# Patient Record
Sex: Male | Born: 1965
Health system: Southern US, Community
[De-identification: ages and names within clinical notes are randomized; demographics above are authoritative.]

## PROBLEM LIST (undated history)

## (undated) DIAGNOSIS — E7521 Fabry (-Anderson) disease: Secondary | ICD-10-CM

## (undated) DIAGNOSIS — I517 Cardiomegaly: Secondary | ICD-10-CM

## (undated) DIAGNOSIS — N182 Chronic kidney disease, stage 2 (mild): Secondary | ICD-10-CM

## (undated) DIAGNOSIS — E559 Vitamin D deficiency, unspecified: Secondary | ICD-10-CM

## (undated) DIAGNOSIS — E782 Mixed hyperlipidemia: Secondary | ICD-10-CM

## (undated) DIAGNOSIS — I10 Essential (primary) hypertension: Secondary | ICD-10-CM

## (undated) HISTORY — DX: Essential (primary) hypertension: I10

## (undated) HISTORY — DX: Fabry (-anderson) disease: E75.21

## (undated) HISTORY — PX: KNEE SURGERY: SHX244

## (undated) HISTORY — PX: NO PAST SURGERIES: SHX2092

---

## 2013-06-30 ENCOUNTER — Ambulatory Visit: Payer: Self-pay | Admitting: Oncology

## 2013-07-31 ENCOUNTER — Ambulatory Visit: Payer: Self-pay | Admitting: Oncology

## 2013-11-15 ENCOUNTER — Emergency Department: Payer: Self-pay | Admitting: Emergency Medicine

## 2013-12-31 ENCOUNTER — Ambulatory Visit: Payer: Self-pay | Admitting: Oncology

## 2014-01-30 ENCOUNTER — Ambulatory Visit: Payer: Self-pay | Admitting: Oncology

## 2014-03-02 ENCOUNTER — Ambulatory Visit: Payer: Self-pay | Admitting: Oncology

## 2014-04-02 ENCOUNTER — Ambulatory Visit: Payer: Self-pay | Admitting: Oncology

## 2014-05-01 ENCOUNTER — Ambulatory Visit
Admit: 2014-05-01 | Disposition: A | Discharge status: Home / Self Care | Payer: Self-pay | Attending: Oncology | Admitting: Oncology

## 2014-06-01 ENCOUNTER — Ambulatory Visit
Admit: 2014-06-01 | Disposition: A | Discharge status: Home / Self Care | Payer: Self-pay | Attending: Oncology | Admitting: Oncology

## 2014-06-03 DIAGNOSIS — E7521 Fabry (-Anderson) disease: Secondary | ICD-10-CM | POA: Insufficient documentation

## 2014-06-16 ENCOUNTER — Other Ambulatory Visit: Payer: Self-pay | Admitting: Oncology

## 2014-06-23 NOTE — Consult Note (Signed)
Note Type Consult   HPI: Referred by Jimmy Footman at the [place] at [facility].   This 49 year old Male patient presents to the clinic for follow up  Fabry disease requiring Fabrazyme infusion.  Subjective: Chief Complaint/Diagnosis:   Fabry disease requiring Fabrazyme infusion. HPI:   Patient returns to clinic today for further evaluation and continuation of Fabrazyme infusion every 2 weeks.  He currently feels well and is asymptomatic.  He has no neurologic complaints.  He denies any recent fevers or illnesses.  He has a good appetite and denies weight loss.  He denies any pain.  He has no chest pain or shortness of breath.  He denies any nausea, vomiting, constipation, or diarrhea.  He has no urinary complaints.  Patient feels at his baseline and offers no specific complaints today.   Review of Systems:  Performance Status (ECOG): 0  Review of Systems:   As per HPI. Otherwise, 10 point system review was negative.   Allergies:  Penicillin: Unknown  Smoking History: Smoking History Never Smoked.(1)  PFSH: Additional Past Medical and Surgical History: Fabry disease.    Family history: Negative and noncontributory.    Social history: Patient denies tobacco or alcohol.   Home Medications: Medication Instructions Last Modified Date/Time  benzonatate 200 mg oral capsule 1 cap(s) orally 3 times a day 16-Sep-15 10:18  fexofenadine-pseudoephedrine 60 mg-120 mg oral tablet, extended release 1 tab(s) orally every 12 hours 16-Sep-15 10:18  Biaxin 500 mg oral tablet 1 tab(s) orally every 12 hours 16-Sep-15 10:18  Lasix 40 mg oral tablet 1 tab(s) orally once a day 16-Sep-15 09:46  Diovan 160 mg oral tablet 1 tab(s) orally once a day 16-Sep-15 09:46  potassium chloride 20 mEq oral tablet, extended release 1 tab(s) orally 2 times a day 16-Sep-15 09:46  Aspir 81 81 mg oral tablet 1 tab(s) orally once a day 16-Sep-15 09:46  multivitamin 1   once a day 16-Sep-15 09:46   Vitamin B-12 1000 mcg oral tablet 1 tab(s) orally once a day 16-Sep-15 09:46   Vital Signs:  :: Ht(CM): 181 Wt(KG): 86.1 BSA: 2 Temp: 97.5 Pulse: 91 RR: 20 O2 Sat: 99  BP: 126/56   Physical Exam:  General: well developed, well nourished, and in no acute distress  Mental Status: normal affect  Eyes: anicteric sclera  Musculoskeletal: No edema  Skin: No rash or petechiae noted  Neurological: alert, answering all questions appropriately.  Cranial nerves grossly intact   Assessment and Plan: Impression:   Fabry disease requiring Fabrazyme infusion. Plan:   1. Fabry disease requiring Fabrazyme infusion: Patient will require 1 mg/kg of Fabrazyme every 2 weeks indefinitely.  He will receive 1000 mg Tylenol and 25 mg Benadryl approximately 30 minutes prior to each infusion.  Patient has now relocated to [facility].  He expressed understanding that we can only provide his infusion.  Any laboratory work or questions regarding his disease or infusions must be directed towards to his primary geneticist.  Return to clinic every 2 weeks for his infusion and then in 6 months for routine follow-up.  He expressed understanding and was in agreement with this plan.  CC Referral:  cc: Jimmy Footman at the [place] at [facility]  Fax: [phone].  Dr. Sherryle Lis @ [facility]   Electronic Signatures: Delight Hoh (MD)  (Signed [phone] 13:00)  Authored: Note Type, History of Present Illness, CC/HPI, Review of Systems, ALLERGIES, Smoking Cessation, Patient Family Social History, HOME MEDICATIONS, Vital Signs, Physical  Exam, Assessment and Plan, CC Referring Physician   Last Updated: 04-Nov-15 13:00 by Delight Hoh (MD)  References: 1.  Data Referenced From "[place] Office Nurse Note" [phone] 9:45 AM

## 2014-06-23 NOTE — Consult Note (Signed)
Note Type Consult   HPI: Referred by Jimmy Footman at the [place] at [facility].   This 49 year old Male patient presents to the clinic for initial evaluation of  Fabry disease requiring Fabrazyme infusion.  Subjective: Chief Complaint/Diagnosis:   Fabry disease requiring Fabrazyme infusion. HPI:   Patient is a 49 year old male whose relocating to [place], [place] from [place], [place].  He has a history of Fabry disease requiring Fabrazyme infusion every 2 weeks.  He currently feels well and is asymptomatic.  She has no neurologic complaints.  He denies any recent fevers or illnesses.  He has a good appetite and denies weight loss.  He denies any pain.  He has no chest pain or shortness of breath.  He denies any nausea, vomiting, constipation, or diarrhea.  He has no urinary complaints.  Patient feels at his baseline and offers no specific complaints today.   Review of Systems:  Performance Status (ECOG): 0  Review of Systems:   As per HPI. Otherwise, 10 point system review was negative.   Allergies:  Penicillin: Unknown  PFSH: Additional Past Medical and Surgical History: Fabry disease.    Family history: Negative and noncontributory.    Social history: Patient denies tobacco or alcohol.   Home Medications: Medication Instructions Last Modified Date/Time  Lasix 40 mg oral tablet 1 tab(s) orally once a day 01-May-15 15:21  Diovan 160 mg oral tablet 1 tab(s) orally once a day 01-May-15 15:21  Lomotil 0.025 mg-2.5 mg oral tablet 2 tab(s) orally 4 times a day, As Needed 01-May-15 15:21  potassium chloride 20 mEq oral tablet, extended release 1 tab(s) orally 2 times a day 01-May-15 15:21  Aspir 81 81 mg oral tablet 1 tab(s) orally once a day 01-May-15 15:21  multivitamin 1   once a day 01-May-15 15:20  Vitamin B-12 1000 mcg oral tablet 1 tab(s) orally once a day 01-May-15 15:21   Vital Signs:  :: Ht(CM): 181 Wt(KG): 99.3 BSA: 2.1 Temp: 98.1 Pulse:  67 RR: 16  BP: 134/84   Physical Exam:  General: well developed, well nourished, and in no acute distress  Mental Status: normal affect  Eyes: anicteric sclera  Head, Ears, Nose,Throat: Normocephalic, moist mucous membranes, clear oropharynx without erythema or thrush.  Neck, Thyroid: No palpable lymphadenopathy, thyroid midline without nodules.  Respiratory: clear to auscultation bilaterally  Cardiovascular: regular rate and rhythm, no murmur, rub, or gallop  Gastrointestinal: soft, nondistended, nontender, no organomegaly.  normal active bowel sounds  Musculoskeletal: No edema  Skin: No rash or petechiae noted  Neurological: alert, answering all questions appropriately.  Cranial nerves grossly intact   Assessment and Plan: Impression:   Fabry disease requiring Fabrazyme infusion. Plan:   1. Fabry disease requiring Fabrazyme infusion: Patient will require 1 mg/kg of Fabrazyme every 2 weeks indefinitely.  He will receive 1000 mg Tylenol and 25 mg Benadryl approximately 30 minutes prior to each infusion.  Patient states he does not plan to relocate to [facility] until October or November of 2015.  He expressed understanding that we can only provide his infusion and any laboratory work or questions regarding his disease or infusions must be directed towards to his primary geneticist or endocrinologist.  No followup has been scheduled at this time, but patient will call clinic once he is officially moves [place].  He expressed understanding and was in agreement with this plan.  CC Referral:  cc: Jimmy Footman at the [place] at [facility]  Fax: [phone].   Electronic Signatures: Delight Hoh (MD)  (Signed 06-May-15 12:12)  Authored: Note Type, History of Present Illness, CC/HPI, Review of Systems, ALLERGIES, Patient Family Social History, HOME MEDICATIONS, Vital Signs, Physical Exam, Assessment and Plan, CC Referring Physician   Last Updated:  06-May-15 12:12 by Delight Hoh (MD)

## 2014-06-25 ENCOUNTER — Other Ambulatory Visit: Payer: Self-pay | Admitting: Oncology

## 2014-06-25 DIAGNOSIS — E7521 Fabry (-Anderson) disease: Secondary | ICD-10-CM

## 2014-06-25 MED ORDER — SODIUM CHLORIDE 0.9 % IV SOLN: 1.0000 mg/kg | INTRAVENOUS | Status: DC

## 2014-06-30 ENCOUNTER — Other Ambulatory Visit: Payer: Self-pay | Admitting: Oncology

## 2014-07-06 ENCOUNTER — Encounter: Payer: Self-pay | Admitting: Oncology

## 2014-07-06 ENCOUNTER — Ambulatory Visit: Payer: Self-pay

## 2014-07-06 ENCOUNTER — Inpatient Hospital Stay: Payer: Medicare Other | Attending: Oncology | Admitting: Oncology

## 2014-07-06 VITALS — BP 113/75 | HR 58 | Temp 96.1°F | Resp 24 | Wt 200.4 lb

## 2014-07-06 DIAGNOSIS — E7521 Fabry (-Anderson) disease: Secondary | ICD-10-CM | POA: Diagnosis present

## 2014-07-06 DIAGNOSIS — Z7982 Long term (current) use of aspirin: Secondary | ICD-10-CM | POA: Insufficient documentation

## 2014-07-06 DIAGNOSIS — I10 Essential (primary) hypertension: Secondary | ICD-10-CM | POA: Insufficient documentation

## 2014-07-06 DIAGNOSIS — Z79899 Other long term (current) drug therapy: Secondary | ICD-10-CM | POA: Insufficient documentation

## 2014-07-06 MED ORDER — ACETAMINOPHEN 500 MG PO TABS: 1000.0000 mg | ORAL_TABLET | Freq: Once | ORAL | Status: DC

## 2014-07-06 MED ORDER — SODIUM CHLORIDE 0.9 % IV SOLN: 1.0000 mg/kg | Freq: Once | INTRAVENOUS | Status: DC

## 2014-07-06 MED ORDER — SODIUM CHLORIDE 0.9 % IV SOLN
80.0000 mg | INTRAVENOUS | Status: DC
  Administered 2014-07-06: 80 mg via INTRAVENOUS
  Filled 2014-07-06: qty 16

## 2014-07-06 MED ORDER — SODIUM CHLORIDE 0.9 % IV SOLN
Freq: Once | INTRAVENOUS | Status: AC
  Administered 2014-07-06: 11:00:00 via INTRAVENOUS
  Filled 2014-07-06: qty 250

## 2014-07-06 MED ORDER — HEPARIN SOD (PORK) LOCK FLUSH 100 UNIT/ML IV SOLN: 500.0000 [IU] | Freq: Once | INTRAVENOUS | Status: DC

## 2014-07-06 MED ORDER — SODIUM CHLORIDE 0.9 % IJ SOLN
10.0000 mL | Freq: Once | INTRAMUSCULAR | Status: DC
  Filled 2014-07-06: qty 10

## 2014-07-06 NOTE — Addendum Note (Signed)
Addended by: Lloyd Huger on: 07/06/2014 02:44 PM   Modules accepted: Level of Service

## 2014-07-06 NOTE — Progress Notes (Addendum)
[place]  [phone] [phone]) [phone]  ID: AMORY DATTA OB: 10/06/1965  MR#: [id number]  [id number]  No care team member to display  CHIEF COMPLAINT:  Chief Complaint  Patient presents with  . Follow-up    Fabry disease requiring Fabrazyme infusion    INTERVAL HISTORY: Patient returns to clinic today for further evaluation and continuation of Fabrazyme infusion every 2 weeks.  He currently feels well and is asymptomatic.  He has no neurologic complaints.  He denies any recent fevers or illnesses.  He has a good appetite and denies weight loss.  He denies any pain.  He has no chest pain or shortness of breath.  He denies any nausea, vomiting, constipation, or diarrhea.  He has no urinary complaints.  Patient feels at his baseline and offers no specific complaints today.   REVIEW OF SYSTEMS:   Review of Systems  Constitutional: Negative for fever and malaise/fatigue.  Cardiovascular: Positive for leg swelling.  Neurological: Negative for weakness.    As per HPI. Otherwise, a complete review of systems is negatve.  PAST MEDICAL HISTORY: Past Medical History  Diagnosis Date  . Fabry disease   . Fabry disease   . Hypertension     PAST SURGICAL HISTORY: No surgeries reported.   FAMILY HISTORY:  Reviewed and unchanged. No report of malignancy or chronic disease.    ADVANCED DIRECTIVES:    HEALTH MAINTENANCE: History  Substance Use Topics  . Smoking status: Never Smoker   . Smokeless tobacco: Not on file  . Alcohol Use: No     Colonoscopy:  PAP:  Bone density:  Lipid panel:  Allergies  Allergen Reactions  . Penicillins Other (See Comments)    unknown    Current Outpatient Prescriptions  Medication Sig Dispense Refill  . agalsidase beta (FABRAZYME) 35 MG injection Inject into the vein.    Marland Kitchen aspirin 81 MG tablet Take 81 mg by mouth daily.    . cyanocobalamin 1000 MCG tablet Take 100 mcg by mouth daily.    .  diphenoxylate-atropine (LOMOTIL) 2.5-0.025 MG per tablet Take by mouth.    . fluticasone (FLONASE) 50 MCG/ACT nasal spray Place into the nose.    . furosemide (LASIX) 40 MG tablet Take 40 mg by mouth daily.    Marland Kitchen loratadine (CLARITIN) 10 MG tablet Take by mouth.    . Multiple Vitamin (MULTIVITAMIN) tablet Take 1 tablet by mouth daily.    . potassium chloride SA (K-DUR,KLOR-CON) 20 MEQ tablet Take 20 mEq by mouth 2 (two) times daily.    . valsartan (DIOVAN) 160 MG tablet Take 160 mg by mouth daily.    . benzonatate (TESSALON) 200 MG capsule Take 200 mg by mouth 3 (three) times daily as needed for cough.    . clarithromycin (BIAXIN) 500 MG tablet Take 500 mg by mouth 2 (two) times daily.    . fexofenadine-pseudoephedrine (ALLEGRA-D) 60-120 MG per tablet Take 1 tablet by mouth 2 (two) times daily.     Current Facility-Administered Medications  Medication Dose Route Frequency Provider Last Rate Last Dose  . acetaminophen (TYLENOL) tablet 1,000 mg  1,000 mg Oral Once Lloyd Huger, MD   1,000 mg at 07/06/14 1058  . agalsidase beta (FABRAZYME) 80 mg in sodium chloride 0.9 % 250 mL IVPB  80 mg Intravenous Q14 Days Lloyd Huger, MD   Stopped at 07/06/14 1304  . heparin lock flush 100 unit/mL  500 Units Intracatheter Once Lloyd Huger, MD   500  Units at 07/06/14 1051  . sodium chloride 0.9 % injection 10 mL  10 mL Intracatheter Once Lloyd Huger, MD   10 mL at 07/06/14 1050    OBJECTIVE: Filed Vitals:   07/06/14 1323  BP: 113/75  Pulse: 58  Temp: 96.1 F (35.6 C)  Resp: 24     Body mass index is 27.75 kg/(m^2).    ECOG FS:0 - Asymptomatic  General: Well-developed, well-nourished, no acute distress. Eyes: Pink conjunctiva, anicteric sclera. HEENT: Normocephalic, moist mucous membranes, clear oropharnyx. Lungs: Clear to auscultation bilaterally. Heart: Regular rate and rhythm. No rubs, murmurs, or gallops. Abdomen: Soft, nontender, nondistended. No organomegaly noted,  normoactive bowel sounds. Musculoskeletal: No edema, cyanosis, or clubbing. Neuro: Alert, answering all questions appropriately. Cranial nerves grossly intact. Skin: No rashes or petechiae noted. Psych: Normal affect.    LAB RESULTS:  No results found for: NA, K, CL, CO2, GLUCOSE, BUN, CREATININE, CALCIUM, PROT, ALBUMIN, AST, ALT, ALKPHOS, BILITOT, GFRNONAA, GFRAA  No results found for: WBC, NEUTROABS, HGB, HCT, MCV, PLT   STUDIES: No results found.  ASSESSMENT: Fabry disease requiring Fabrazyme infusion.  PLAN:    1. Fabry disease requiring Fabrazyme infusion: Patient will require ~1 mg/kg of Fabrazyme or 80mg  every 2 weeks indefinitely.  He will receive 1000 mg Tylenol and 25 mg Benadryl approximately 30 minutes prior to each infusion.  Patient has now relocated to [facility].  He expressed understanding that we can only provide his infusion.  Any laboratory work or questions regarding his disease or infusions must be directed towards to his primary geneticist.  Return to clinic every 2 weeks for his infusion and then in 6 months for routine follow-up.    Patient expressed understanding and was in agreement with this plan. He also understands that He can call clinic at any time with any questions, concerns, or complaints.   No matching staging information was found for the patient.  Lloyd Huger, MD   07/06/2014 2:26 PM

## 2014-07-06 NOTE — Progress Notes (Signed)
erro  neous encounter

## 2014-07-18 ENCOUNTER — Inpatient Hospital Stay: Payer: Medicare Other

## 2014-07-18 VITALS — BP 147/74 | HR 59 | Temp 95.3°F | Resp 20

## 2014-07-18 DIAGNOSIS — E7521 Fabry (-Anderson) disease: Secondary | ICD-10-CM

## 2014-07-18 MED ORDER — SODIUM CHLORIDE 0.9 % IV SOLN
Freq: Once | INTRAVENOUS | Status: AC
  Administered 2014-07-18: 10:00:00 via INTRAVENOUS
  Filled 2014-07-18: qty 250

## 2014-07-18 MED ORDER — SODIUM CHLORIDE 0.9 % IV SOLN
1.0000 mg/kg | Freq: Once | INTRAVENOUS | Status: AC
  Administered 2014-07-18: 80 mg via INTRAVENOUS
  Filled 2014-07-18: qty 16

## 2014-07-18 MED ORDER — ACETAMINOPHEN 500 MG PO TABS: 1000.0000 mg | ORAL_TABLET | Freq: Once | ORAL | Status: AC

## 2014-08-01 ENCOUNTER — Inpatient Hospital Stay: Payer: Medicare Other | Attending: Oncology

## 2014-08-01 ENCOUNTER — Inpatient Hospital Stay: Payer: Medicare Other

## 2014-08-01 VITALS — BP 106/54 | HR 53 | Temp 94.7°F | Resp 20 | Wt 199.3 lb

## 2014-08-01 DIAGNOSIS — Z79899 Other long term (current) drug therapy: Secondary | ICD-10-CM | POA: Insufficient documentation

## 2014-08-01 DIAGNOSIS — E7521 Fabry (-Anderson) disease: Secondary | ICD-10-CM | POA: Diagnosis present

## 2014-08-01 MED ORDER — SODIUM CHLORIDE 0.9 % IV SOLN
80.0000 mg | Freq: Once | INTRAVENOUS | Status: AC
  Administered 2014-08-01: 80 mg via INTRAVENOUS
  Filled 2014-08-01: qty 16

## 2014-08-01 MED ORDER — SODIUM CHLORIDE 0.9 % IV SOLN
Freq: Once | INTRAVENOUS | Status: AC
  Administered 2014-08-01: 10:00:00 via INTRAVENOUS
  Filled 2014-08-01: qty 250

## 2014-08-15 ENCOUNTER — Inpatient Hospital Stay: Payer: Medicare Other

## 2014-08-15 VITALS — BP 110/67 | HR 66 | Temp 97.6°F | Resp 18

## 2014-08-15 DIAGNOSIS — Z79899 Other long term (current) drug therapy: Secondary | ICD-10-CM | POA: Diagnosis not present

## 2014-08-15 DIAGNOSIS — E7521 Fabry (-Anderson) disease: Secondary | ICD-10-CM

## 2014-08-15 MED ORDER — ACETAMINOPHEN 500 MG PO TABS: 1000.0000 mg | ORAL_TABLET | Freq: Once | ORAL | Status: DC

## 2014-08-15 MED ORDER — SODIUM CHLORIDE 0.9 % IV SOLN
Freq: Once | INTRAVENOUS | Status: AC
Start: 2014-08-15 — End: 2014-08-15
  Administered 2014-08-15: 10:00:00 via INTRAVENOUS
  Filled 2014-08-15: qty 1000

## 2014-08-15 MED ORDER — SODIUM CHLORIDE 0.9 % IV SOLN
80.0000 mg | Freq: Once | INTRAVENOUS | Status: AC
  Administered 2014-08-15: 80 mg via INTRAVENOUS
  Filled 2014-08-15: qty 16

## 2014-08-16 ENCOUNTER — Encounter: Payer: Self-pay | Admitting: Pharmacist

## 2014-08-16 NOTE — Progress Notes (Signed)
Prescription Sig. Disp. Refills Start Date End Date  agalsidase beta (FABRAZYME) 35 mg injection Inject 16 mLs (80 mg total) into the vein every 14 (fourteen) days. dose is 1mg /kg . Please round up to the nearest 5mg . 256 mL  0 08/13/2014 03/12/2015

## 2014-08-29 ENCOUNTER — Inpatient Hospital Stay: Payer: Medicare Other

## 2014-08-29 VITALS — BP 123/63 | HR 60 | Temp 96.0°F | Resp 18 | Wt 189.5 lb

## 2014-08-29 DIAGNOSIS — Z79899 Other long term (current) drug therapy: Secondary | ICD-10-CM | POA: Diagnosis not present

## 2014-08-29 DIAGNOSIS — E7521 Fabry (-Anderson) disease: Secondary | ICD-10-CM

## 2014-08-29 MED ORDER — SODIUM CHLORIDE 0.9 % IV SOLN
Freq: Once | INTRAVENOUS | Status: AC
  Administered 2014-08-29: 10:00:00 via INTRAVENOUS
  Filled 2014-08-29: qty 250

## 2014-08-29 MED ORDER — ACETAMINOPHEN 500 MG PO TABS
1000.0000 mg | ORAL_TABLET | Freq: Once | ORAL | Status: DC
  Filled 2014-08-29: qty 2

## 2014-08-29 MED ORDER — SODIUM CHLORIDE 0.9 % IV SOLN
80.0000 mg | Freq: Once | INTRAVENOUS | Status: AC
  Administered 2014-08-29: 80 mg via INTRAVENOUS
  Filled 2014-08-29: qty 16

## 2014-09-12 ENCOUNTER — Inpatient Hospital Stay: Payer: Medicare Other

## 2014-09-12 ENCOUNTER — Inpatient Hospital Stay: Payer: Medicare Other | Attending: Oncology

## 2014-09-12 VITALS — BP 124/76 | HR 76 | Temp 97.8°F | Resp 18

## 2014-09-12 DIAGNOSIS — E7521 Fabry (-Anderson) disease: Secondary | ICD-10-CM | POA: Diagnosis present

## 2014-09-12 DIAGNOSIS — Z79899 Other long term (current) drug therapy: Secondary | ICD-10-CM | POA: Insufficient documentation

## 2014-09-12 MED ORDER — SODIUM CHLORIDE 0.9 % IV SOLN
Freq: Once | INTRAVENOUS | Status: AC
  Administered 2014-09-12: 10:00:00 via INTRAVENOUS
  Filled 2014-09-12: qty 1000

## 2014-09-12 MED ORDER — SODIUM CHLORIDE 0.9 % IV SOLN
80.0000 mg | Freq: Once | INTRAVENOUS | Status: AC
  Administered 2014-09-12: 80 mg via INTRAVENOUS
  Filled 2014-09-12: qty 16

## 2014-09-12 MED ORDER — ACETAMINOPHEN 500 MG PO TABS: 1000.0000 mg | ORAL_TABLET | Freq: Once | ORAL | Status: DC

## 2014-09-26 ENCOUNTER — Inpatient Hospital Stay: Payer: Medicare Other

## 2014-09-28 ENCOUNTER — Inpatient Hospital Stay: Payer: Medicare Other

## 2014-09-28 VITALS — BP 116/65 | HR 54 | Temp 98.0°F | Resp 16

## 2014-09-28 DIAGNOSIS — E7521 Fabry (-Anderson) disease: Secondary | ICD-10-CM

## 2014-09-28 MED ORDER — ACETAMINOPHEN 500 MG PO TABS
1000.0000 mg | ORAL_TABLET | Freq: Once | ORAL | Status: DC
  Filled 2014-09-28: qty 2

## 2014-09-28 MED ORDER — SODIUM CHLORIDE 0.9 % IV SOLN
Freq: Once | INTRAVENOUS | Status: AC
  Administered 2014-09-28: 10:00:00 via INTRAVENOUS
  Filled 2014-09-28: qty 1000

## 2014-09-28 MED ORDER — SODIUM CHLORIDE 0.9 % IV SOLN
80.0000 mg | Freq: Once | INTRAVENOUS | Status: AC
  Administered 2014-09-28: 80 mg via INTRAVENOUS
  Filled 2014-09-28: qty 16

## 2014-10-10 ENCOUNTER — Inpatient Hospital Stay: Payer: Medicare Other | Attending: Oncology

## 2014-10-10 ENCOUNTER — Inpatient Hospital Stay: Payer: Medicare Other

## 2014-10-10 VITALS — BP 122/68 | HR 72 | Temp 97.0°F | Resp 18 | Wt 200.6 lb

## 2014-10-10 DIAGNOSIS — Z79899 Other long term (current) drug therapy: Secondary | ICD-10-CM | POA: Insufficient documentation

## 2014-10-10 DIAGNOSIS — E7521 Fabry (-Anderson) disease: Secondary | ICD-10-CM | POA: Diagnosis present

## 2014-10-10 MED ORDER — SODIUM CHLORIDE 0.9 % IV SOLN
80.0000 mg | Freq: Once | INTRAVENOUS | Status: AC
  Administered 2014-10-10: 80 mg via INTRAVENOUS
  Filled 2014-10-10: qty 16

## 2014-10-10 MED ORDER — SODIUM CHLORIDE 0.9 % IV SOLN
Freq: Once | INTRAVENOUS | Status: AC
  Administered 2014-10-10: 10:00:00 via INTRAVENOUS
  Filled 2014-10-10: qty 1000

## 2014-10-10 MED ORDER — ACETAMINOPHEN 500 MG PO TABS: 1000.0000 mg | ORAL_TABLET | Freq: Once | ORAL | Status: DC

## 2014-10-24 ENCOUNTER — Inpatient Hospital Stay: Payer: Medicare Other

## 2014-10-24 VITALS — BP 117/67 | HR 71 | Temp 97.8°F | Resp 18 | Wt 198.4 lb

## 2014-10-24 DIAGNOSIS — E7521 Fabry (-Anderson) disease: Secondary | ICD-10-CM | POA: Diagnosis not present

## 2014-10-24 MED ORDER — SODIUM CHLORIDE 0.9 % IV SOLN
Freq: Once | INTRAVENOUS | Status: AC
  Administered 2014-10-24: 10:00:00 via INTRAVENOUS
  Filled 2014-10-24: qty 1000

## 2014-10-24 MED ORDER — SODIUM CHLORIDE 0.9 % IV SOLN
80.0000 mg | Freq: Once | INTRAVENOUS | Status: AC
  Administered 2014-10-24: 80 mg via INTRAVENOUS
  Filled 2014-10-24: qty 16

## 2014-10-24 MED ORDER — ACETAMINOPHEN 500 MG PO TABS: 1000.0000 mg | ORAL_TABLET | Freq: Once | ORAL | Status: DC

## 2014-11-07 ENCOUNTER — Inpatient Hospital Stay: Payer: Medicare Other | Attending: Oncology

## 2014-11-07 ENCOUNTER — Inpatient Hospital Stay: Payer: Medicare Other

## 2014-11-07 VITALS — BP 106/64 | HR 66 | Temp 96.9°F | Resp 18

## 2014-11-07 DIAGNOSIS — Z79899 Other long term (current) drug therapy: Secondary | ICD-10-CM | POA: Diagnosis not present

## 2014-11-07 DIAGNOSIS — E7521 Fabry (-Anderson) disease: Secondary | ICD-10-CM | POA: Diagnosis not present

## 2014-11-07 MED ORDER — SODIUM CHLORIDE 0.9 % IV SOLN
Freq: Once | INTRAVENOUS | Status: AC
  Administered 2014-11-07: 10:00:00 via INTRAVENOUS
  Filled 2014-11-07: qty 1000

## 2014-11-07 MED ORDER — ACETAMINOPHEN 500 MG PO TABS
1000.0000 mg | ORAL_TABLET | Freq: Once | ORAL | Status: DC
Start: 2014-11-07 — End: 2014-11-07

## 2014-11-07 MED ORDER — SODIUM CHLORIDE 0.9 % IV SOLN
80.0000 mg | Freq: Once | INTRAVENOUS | Status: AC
  Administered 2014-11-07: 80 mg via INTRAVENOUS
  Filled 2014-11-07: qty 16

## 2014-11-21 ENCOUNTER — Inpatient Hospital Stay: Payer: Medicare Other

## 2014-11-21 VITALS — BP 108/63 | HR 59 | Temp 95.0°F | Resp 18 | Wt 203.3 lb

## 2014-11-21 DIAGNOSIS — E7521 Fabry (-Anderson) disease: Secondary | ICD-10-CM

## 2014-11-21 MED ORDER — SODIUM CHLORIDE 0.9 % IV SOLN
Freq: Once | INTRAVENOUS | Status: AC
  Administered 2014-11-21: 09:00:00 via INTRAVENOUS
  Filled 2014-11-21: qty 1000

## 2014-11-21 MED ORDER — ACETAMINOPHEN 500 MG PO TABS: 1000.0000 mg | ORAL_TABLET | Freq: Once | ORAL | Status: DC

## 2014-11-21 MED ORDER — SODIUM CHLORIDE 0.9 % IV SOLN
80.0000 mg | Freq: Once | INTRAVENOUS | Status: AC
  Administered 2014-11-21: 80 mg via INTRAVENOUS
  Filled 2014-11-21: qty 16

## 2014-12-05 ENCOUNTER — Inpatient Hospital Stay: Payer: Medicare Other | Attending: Oncology

## 2014-12-05 ENCOUNTER — Inpatient Hospital Stay: Payer: Medicare Other

## 2014-12-05 VITALS — BP 120/60 | HR 66 | Temp 98.0°F | Wt 205.2 lb

## 2014-12-05 DIAGNOSIS — E7521 Fabry (-Anderson) disease: Secondary | ICD-10-CM | POA: Diagnosis present

## 2014-12-05 DIAGNOSIS — Z79899 Other long term (current) drug therapy: Secondary | ICD-10-CM | POA: Diagnosis not present

## 2014-12-05 MED ORDER — SODIUM CHLORIDE 0.9 % IV SOLN
80.0000 mg | Freq: Once | INTRAVENOUS | Status: AC
  Administered 2014-12-05: 80 mg via INTRAVENOUS
  Filled 2014-12-05: qty 16

## 2014-12-05 MED ORDER — ACETAMINOPHEN 500 MG PO TABS: 1000.0000 mg | ORAL_TABLET | Freq: Once | ORAL | Status: DC

## 2014-12-05 MED ORDER — SODIUM CHLORIDE 0.9 % IV SOLN
Freq: Once | INTRAVENOUS | Status: AC
  Administered 2014-12-05: 10:00:00 via INTRAVENOUS
  Filled 2014-12-05: qty 1000

## 2014-12-19 ENCOUNTER — Inpatient Hospital Stay: Payer: Medicare Other

## 2014-12-19 VITALS — BP 117/68 | HR 68 | Temp 97.5°F | Wt 201.3 lb

## 2014-12-19 DIAGNOSIS — E7521 Fabry (-Anderson) disease: Secondary | ICD-10-CM

## 2014-12-19 MED ORDER — SODIUM CHLORIDE 0.9 % IV SOLN
80.0000 mg | Freq: Once | INTRAVENOUS | Status: AC
  Administered 2014-12-19: 80 mg via INTRAVENOUS
  Filled 2014-12-19: qty 16

## 2014-12-19 MED ORDER — SODIUM CHLORIDE 0.9 % IV SOLN
80.0000 mg | Freq: Once | INTRAVENOUS | Status: DC
  Filled 2014-12-19: qty 16

## 2014-12-19 MED ORDER — ACETAMINOPHEN 500 MG PO TABS: 1000.0000 mg | ORAL_TABLET | Freq: Once | ORAL | Status: DC

## 2014-12-19 MED ORDER — SODIUM CHLORIDE 0.9 % IV SOLN
Freq: Once | INTRAVENOUS | Status: AC
  Administered 2014-12-19: 10:00:00 via INTRAVENOUS
  Filled 2014-12-19: qty 1000

## 2015-01-02 ENCOUNTER — Inpatient Hospital Stay: Payer: Medicare Other

## 2015-01-04 ENCOUNTER — Inpatient Hospital Stay: Payer: Medicare Other

## 2015-01-04 ENCOUNTER — Inpatient Hospital Stay: Payer: Medicare Other | Attending: Oncology | Admitting: Oncology

## 2015-01-04 VITALS — BP 130/51 | HR 60 | Temp 98.7°F | Resp 20 | Wt 198.9 lb

## 2015-01-04 DIAGNOSIS — I10 Essential (primary) hypertension: Secondary | ICD-10-CM | POA: Diagnosis not present

## 2015-01-04 DIAGNOSIS — E7521 Fabry (-Anderson) disease: Secondary | ICD-10-CM | POA: Insufficient documentation

## 2015-01-04 DIAGNOSIS — Z79899 Other long term (current) drug therapy: Secondary | ICD-10-CM | POA: Diagnosis not present

## 2015-01-04 DIAGNOSIS — Z7982 Long term (current) use of aspirin: Secondary | ICD-10-CM | POA: Insufficient documentation

## 2015-01-04 MED ORDER — ACETAMINOPHEN 500 MG PO TABS: 1000.0000 mg | ORAL_TABLET | Freq: Once | ORAL | Status: DC

## 2015-01-04 MED ORDER — SODIUM CHLORIDE 0.9 % IV SOLN
Freq: Once | INTRAVENOUS | Status: AC
  Administered 2015-01-04: 10:00:00 via INTRAVENOUS
  Filled 2015-01-04: qty 1000

## 2015-01-04 MED ORDER — SODIUM CHLORIDE 0.9 % IV SOLN
80.0000 mg | Freq: Once | INTRAVENOUS | Status: AC
  Administered 2015-01-04: 80 mg via INTRAVENOUS
  Filled 2015-01-04: qty 16

## 2015-01-11 NOTE — Progress Notes (Signed)
[place]  [phone] [phone]) [phone]  ID: Jeffery Hughes OB: [birth year]  MR#: [id number]  [id number]  No care team member to display  CHIEF COMPLAINT: Fabry disease requiring Fabrazyme infusion.  INTERVAL HISTORY: Patient returns to clinic today for further evaluation and continuation of Fabrazyme infusion every 2 weeks.  He currently feels well and is asymptomatic.  He has no neurologic complaints.  He denies any recent fevers or illnesses.  He has a good appetite and denies weight loss.  He denies any pain.  He has no chest pain or shortness of breath.  He denies any nausea, vomiting, constipation, or diarrhea.  He has no urinary complaints.  Patient feels at his baseline and offers no specific complaints today.   REVIEW OF SYSTEMS:   Review of Systems  Constitutional: Negative.   Respiratory: Negative.   Cardiovascular: Negative.   Musculoskeletal: Negative.   Neurological: Negative.     As per HPI. Otherwise, a complete review of systems is negatve.  PAST MEDICAL HISTORY: Past Medical History  Diagnosis Date  . Fabry disease   . Fabry disease   . Hypertension     PAST SURGICAL HISTORY: No past surgical history on file.  FAMILY HISTORY: Brother with Fabry's disease, otherwise negative.     ADVANCED DIRECTIVES:    HEALTH MAINTENANCE: Social History  Substance Use Topics  . Smoking status: Never Smoker   . Smokeless tobacco: Not on file  . Alcohol Use: No     Colonoscopy:  PAP:  Bone density:  Lipid panel:  Allergies  Allergen Reactions  . Penicillins Other (See Comments)    unknown    Current Outpatient Prescriptions  Medication Sig Dispense Refill  . agalsidase beta (FABRAZYME) 35 MG injection Inject into the vein.    Marland Kitchen aspirin 81 MG tablet Take 81 mg by mouth daily.    . benzonatate (TESSALON) 200 MG capsule Take 200 mg by mouth 3 (three) times daily as needed for cough.    . clarithromycin (BIAXIN) 500 MG  tablet Take 500 mg by mouth 2 (two) times daily.    . cyanocobalamin 1000 MCG tablet Take 100 mcg by mouth daily.    . diphenoxylate-atropine (LOMOTIL) 2.5-0.025 MG per tablet Take by mouth.    . fexofenadine-pseudoephedrine (ALLEGRA-D) 60-120 MG per tablet Take 1 tablet by mouth 2 (two) times daily.    . fluticasone (FLONASE) 50 MCG/ACT nasal spray Place into the nose.    . furosemide (LASIX) 40 MG tablet Take 40 mg by mouth daily.    Marland Kitchen loratadine (CLARITIN) 10 MG tablet Take by mouth.    . Multiple Vitamin (MULTIVITAMIN) tablet Take 1 tablet by mouth daily.    . potassium chloride SA (K-DUR,KLOR-CON) 20 MEQ tablet Take 20 mEq by mouth 2 (two) times daily.    . valsartan (DIOVAN) 160 MG tablet Take 160 mg by mouth daily.     No current facility-administered medications for this visit.   Facility-Administered Medications Ordered in Other Visits  Medication Dose Route Frequency Provider Last Rate Last Dose  . acetaminophen (TYLENOL) tablet 1,000 mg  1,000 mg Oral Once Lloyd Huger, MD   1,000 mg at 07/18/14 1021    OBJECTIVE: There were no vitals filed for this visit.   There is no weight on file to calculate BMI.    ECOG FS:0 - Asymptomatic  General: Well-developed, well-nourished, no acute distress. Eyes: Pink conjunctiva, anicteric sclera. Lungs: Clear to auscultation bilaterally. Heart: Regular rate and rhythm.  No rubs, murmurs, or gallops. Abdomen: Soft, nontender, nondistended. No organomegaly noted, normoactive bowel sounds. Musculoskeletal: No edema, cyanosis, or clubbing. Neuro: Alert, answering all questions appropriately. Cranial nerves grossly intact. Skin: No rashes or petechiae noted. Psych: Normal affect.   LAB RESULTS:  No results found for: NA, K, CL, CO2, GLUCOSE, BUN, CREATININE, CALCIUM, PROT, ALBUMIN, AST, ALT, ALKPHOS, BILITOT, GFRNONAA, GFRAA  No results found for: WBC, NEUTROABS, HGB, HCT, MCV, PLT   STUDIES: No results found.  ASSESSMENT: Fabry  disease requiring Fabrazyme infusion.  PLAN:    1. Fabry disease requiring Fabrazyme infusion: Patient will require 1 mg/kg of Fabrazyme every 2 weeks indefinitely.  He will receive 1000 mg Tylenol and 25 mg Benadryl approximately 30 minutes prior to each infusion. He expressed understanding that we can only provide his infusion.  Any laboratory work or questions regarding his disease or infusions must be directed towards to his primary geneticist.  Return to clinic every 2 weeks for his infusion and then in 6 months for routine follow-up.   Patient expressed understanding and was in agreement with this plan. He also understands that He can call clinic at any time with any questions, concerns, or complaints.   PCP: Dr. Ernest Pine at [facility] Geneticist:  Dr. Sherryle Lis at [place], [place]   01/11/2015 6:22 AM

## 2015-01-12 ENCOUNTER — Observation Stay
Admission: EM | Admit: 2015-01-12 | Discharge: 2015-01-14 | Disposition: A | Discharge status: Home / Self Care | Payer: Medicare Other | Attending: Internal Medicine | Admitting: Internal Medicine

## 2015-01-12 ENCOUNTER — Encounter: Payer: Self-pay | Admitting: Internal Medicine

## 2015-01-12 ENCOUNTER — Emergency Department: Payer: Medicare Other

## 2015-01-12 DIAGNOSIS — Z79899 Other long term (current) drug therapy: Secondary | ICD-10-CM | POA: Diagnosis not present

## 2015-01-12 DIAGNOSIS — R0602 Shortness of breath: Secondary | ICD-10-CM | POA: Insufficient documentation

## 2015-01-12 DIAGNOSIS — I890 Lymphedema, not elsewhere classified: Secondary | ICD-10-CM | POA: Insufficient documentation

## 2015-01-12 DIAGNOSIS — I10 Essential (primary) hypertension: Secondary | ICD-10-CM | POA: Diagnosis present

## 2015-01-12 DIAGNOSIS — R748 Abnormal levels of other serum enzymes: Secondary | ICD-10-CM | POA: Insufficient documentation

## 2015-01-12 DIAGNOSIS — R079 Chest pain, unspecified: Secondary | ICD-10-CM

## 2015-01-12 DIAGNOSIS — Z823 Family history of stroke: Secondary | ICD-10-CM | POA: Diagnosis not present

## 2015-01-12 DIAGNOSIS — R072 Precordial pain: Principal | ICD-10-CM | POA: Insufficient documentation

## 2015-01-12 DIAGNOSIS — Z8249 Family history of ischemic heart disease and other diseases of the circulatory system: Secondary | ICD-10-CM | POA: Insufficient documentation

## 2015-01-12 DIAGNOSIS — Z808 Family history of malignant neoplasm of other organs or systems: Secondary | ICD-10-CM | POA: Insufficient documentation

## 2015-01-12 DIAGNOSIS — Z7982 Long term (current) use of aspirin: Secondary | ICD-10-CM | POA: Insufficient documentation

## 2015-01-12 DIAGNOSIS — R05 Cough: Secondary | ICD-10-CM | POA: Diagnosis not present

## 2015-01-12 DIAGNOSIS — Z880 Allergy status to penicillin: Secondary | ICD-10-CM | POA: Diagnosis not present

## 2015-01-12 DIAGNOSIS — N183 Chronic kidney disease, stage 3 (moderate): Secondary | ICD-10-CM | POA: Insufficient documentation

## 2015-01-12 DIAGNOSIS — R778 Other specified abnormalities of plasma proteins: Secondary | ICD-10-CM | POA: Diagnosis present

## 2015-01-12 DIAGNOSIS — M940 Chondrocostal junction syndrome [Tietze]: Secondary | ICD-10-CM | POA: Insufficient documentation

## 2015-01-12 DIAGNOSIS — E559 Vitamin D deficiency, unspecified: Secondary | ICD-10-CM | POA: Diagnosis not present

## 2015-01-12 DIAGNOSIS — I129 Hypertensive chronic kidney disease with stage 1 through stage 4 chronic kidney disease, or unspecified chronic kidney disease: Secondary | ICD-10-CM | POA: Insufficient documentation

## 2015-01-12 DIAGNOSIS — E7521 Fabry (-Anderson) disease: Secondary | ICD-10-CM | POA: Insufficient documentation

## 2015-01-12 DIAGNOSIS — R7989 Other specified abnormal findings of blood chemistry: Secondary | ICD-10-CM | POA: Diagnosis present

## 2015-01-12 HISTORY — DX: Chronic kidney disease, stage 2 (mild): N18.2

## 2015-01-12 HISTORY — DX: Cardiomegaly: I51.7

## 2015-01-12 HISTORY — DX: Mixed hyperlipidemia: E78.2

## 2015-01-12 HISTORY — DX: Vitamin D deficiency, unspecified: E55.9

## 2015-01-12 LAB — CBC
HEMATOCRIT: 42.8 % (ref 40.0–52.0)
HEMOGLOBIN: 15.5 g/dL (ref 13.0–18.0)
MCH: 31.6 pg (ref 26.0–34.0)
MCHC: 36.3 g/dL — AB (ref 32.0–36.0)
MCV: 87 fL (ref 80.0–100.0)
Platelets: 313 10*3/uL (ref 150–440)
RBC: 4.92 MIL/uL (ref 4.40–5.90)
RDW: 13.2 % (ref 11.5–14.5)
WBC: 9.5 10*3/uL (ref 3.8–10.6)

## 2015-01-12 LAB — BASIC METABOLIC PANEL
ANION GAP: 11 (ref 5–15)
BUN: 15 mg/dL (ref 6–20)
CHLORIDE: 101 mmol/L (ref 101–111)
CO2: UNDETERMINED mmol/L (ref 22–32)
Calcium: UNDETERMINED mg/dL (ref 8.9–10.3)
Creatinine, Ser: 1.56 mg/dL — ABNORMAL HIGH (ref 0.61–1.24)
GFR calc non Af Amer: 50 mL/min — ABNORMAL LOW (ref >60)
GFR, EST AFRICAN AMERICAN: 58 mL/min — AB (ref >60)
Glucose, Bld: UNDETERMINED mg/dL (ref 65–99)
POTASSIUM: UNDETERMINED mmol/L (ref 3.5–5.1)
Sodium: UNDETERMINED mmol/L (ref 135–145)

## 2015-01-12 LAB — TROPONIN I: Troponin I: 0.08 ng/mL — ABNORMAL HIGH (ref <0.031)

## 2015-01-12 MED ORDER — ASPIRIN 81 MG PO CHEW
324.0000 mg | CHEWABLE_TABLET | Freq: Once | ORAL | Status: AC
  Administered 2015-01-12: 324 mg via ORAL
  Filled 2015-01-12: qty 4

## 2015-01-12 NOTE — H&P (Signed)
[facility] NAME: Jeffery Hughes    MR#:  [id number]  DATE OF BIRTH:  Oct 18, 1965  DATE OF ADMISSION:  01/12/2015  PRIMARY CARE PHYSICIAN: [facility]   REQUESTING/REFERRING PHYSICIAN: Kerman Passey, M.D.  CHIEF COMPLAINT:   Chief Complaint  Patient presents with  . Chest Pain    x 2 weeks, cough productive white    HISTORY OF PRESENT ILLNESS:  Jeffery Hughes  is a 49 y.o. male who presents with chest pain after 2 weeks of cough and chest congestion. Patient states that he took an antibiotic for the same, and that his cough improved. However, he still clearing significant amount of whitish mucus. He's been taking Mucinex, which she feels been helping some. However, he does have a complaint of sternal chest pain. He does not describe this chest pain is exertional or alleviated with rest. It is nonradiating, not associated with any other symptoms. He specifically denies diaphoresis, nausea or vomiting, dizziness, vision changes. 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 He has significant hyperlipidemia, and family history of heart disease in both his mother and his father. In the ED today he was found have a positive troponin of 0.08. Hospitalists were called for admission for the same.  PAST MEDICAL HISTORY:   Past Medical History  Diagnosis Date  . Fabry disease ([place])   . Hypertension   . Mixed hyperlipidemia     PAST SURGICAL HISTORY:   Past Surgical History  Procedure Laterality Date  . No past surgeries      SOCIAL HISTORY:   Social History  Substance Use Topics  . Smoking status: Never Smoker   . Smokeless tobacco: Not on file  . Alcohol Use: No    FAMILY HISTORY:   Family History  Problem Relation Age of Onset  . Stroke Mother   . CAD Mother   . CAD Father   . Skin cancer Father     DRUG ALLERGIES:   Allergies  Allergen Reactions  . Penicillins Other (See Comments)     unknown    MEDICATIONS AT HOME:   Prior to Admission medications   Medication Sig Start Date End Date Taking? Authorizing Provider  aspirin 81 MG tablet Take 81 mg by mouth daily.   Yes Historical Provider, MD  cyanocobalamin 1000 MCG tablet Take 100 mcg by mouth daily.   Yes Historical Provider, MD  diphenoxylate-atropine (LOMOTIL) 2.5-0.025 MG per tablet Take 2 tablets by mouth 4 (four) times daily as needed.  06/19/14  Yes Historical Provider, MD  fexofenadine-pseudoephedrine (ALLEGRA-D) 60-120 MG per tablet Take 1 tablet by mouth 2 (two) times daily.   Yes Historical Provider, MD  furosemide (LASIX) 40 MG tablet Take 40 mg by mouth daily.   Yes Historical Provider, MD  loratadine (CLARITIN) 10 MG tablet Take 10 mg by mouth daily as needed.  05/14/14 05/14/15 Yes Historical Provider, MD  Multiple Vitamin (MULTIVITAMIN) tablet Take 1 tablet by mouth daily.   Yes Historical Provider, MD  potassium chloride SA (K-DUR,KLOR-CON) 20 MEQ tablet Take 20 mEq by mouth 2 (two) times daily.   Yes Historical Provider, MD  valsartan (DIOVAN) 160 MG tablet Take 160 mg by mouth daily. Pt takes 1 in the morning and 1/2 tablet every evening.   Yes Historical Provider, MD  clarithromycin (BIAXIN) 500 MG tablet Take 500 mg by mouth 2 (two) times daily.    Historical Provider, MD    REVIEW OF  SYSTEMS:  Review of Systems  Constitutional: Negative for fever, chills, weight loss and malaise/fatigue.  HENT: Negative for ear pain, hearing loss and tinnitus.   Eyes: Negative for blurred vision, double vision, pain and redness.  Respiratory: Positive for cough and sputum production. Negative for hemoptysis and shortness of breath.   Cardiovascular: Positive for chest pain. Negative for palpitations, orthopnea and leg swelling.  Gastrointestinal: Negative for nausea, vomiting, abdominal pain, diarrhea and constipation.  Genitourinary: Negative for dysuria, frequency and hematuria.  Musculoskeletal: Negative for  back pain, joint pain and neck pain.  Skin:       No acne, rash, or lesions  Neurological: Negative for dizziness, tremors, focal weakness and weakness.  Endo/Heme/Allergies: Negative for polydipsia. Does not bruise/bleed easily.  Psychiatric/Behavioral: Negative for depression. The patient is not nervous/anxious and does not have insomnia.      VITAL SIGNS:   Filed Vitals:   01/12/15 2030 01/12/15 2100 01/12/15 2130 01/12/15 2200  BP: 123/76 121/73 120/79 109/82  Pulse: 63 62 56 77  Resp:  18  18  SpO2: 97% 97% 97% 97%   Wt Readings from Last 3 Encounters:  01/04/15 90.2 kg (198 lb 13.7 oz)  12/19/14 91.3 kg (201 lb 4.5 oz)  12/05/14 93.1 kg (205 lb 4 oz)    PHYSICAL EXAMINATION:  Physical Exam  Vitals reviewed. Constitutional: He is oriented to person, place, and time. He appears well-developed and well-nourished. No distress.  HENT:  Head: Normocephalic and atraumatic.  Mouth/Throat: Oropharynx is clear and moist.  Eyes: Conjunctivae and EOM are normal. Pupils are equal, round, and reactive to light. No scleral icterus.  Neck: Normal range of motion. Neck supple. No JVD present. No thyromegaly present.  Cardiovascular: Normal rate, regular rhythm and intact distal pulses.  Exam reveals no gallop and no friction rub.   No murmur heard. Respiratory: Effort normal and breath sounds normal. No respiratory distress. He has no wheezes. He has no rales. He exhibits tenderness (central chest tenderness, reproducible on palpation).  GI: Soft. Bowel sounds are normal. He exhibits no distension. There is no tenderness.  Musculoskeletal: Normal range of motion. He exhibits no edema.  No arthritis, no gout  Lymphadenopathy:    He has no cervical adenopathy.  Neurological: He is alert and oriented to person, place, and time. No cranial nerve deficit.  No dysarthria, no aphasia  Skin: Skin is warm and dry. No rash noted. No erythema.  Psychiatric: He has a normal mood and affect. His  behavior is normal. Judgment and thought content normal.    LABORATORY PANEL:   CBC  Recent Labs Lab 01/12/15 1837  WBC 9.5  HGB 15.5  HCT 42.8  PLT 313   ------------------------------------------------------------------------------------------------------------------  Chemistries   Recent Labs Lab 01/12/15 1837  NA UNABLE TO REPORT DUE TO LIPEMIC INTERFERENCE  K UNABLE TO REPORT DUE TO LIPEMIC INTERFERENCE  CL 101  CO2 UNABLE TO REPORT DUE TO LIPEMIC INTERFERENCE  GLUCOSE UNABLE TO REPORT DUE TO LIPEMIC INTERFERENCE  BUN 15  CREATININE 1.56*  CALCIUM UNABLE TO REPORT DUE TO LIPEMIC INTERFERENCE   ------------------------------------------------------------------------------------------------------------------  Cardiac Enzymes  Recent Labs Lab 01/12/15 1837  TROPONINI 0.08*   ------------------------------------------------------------------------------------------------------------------  RADIOLOGY:  Dg Chest 2 View  01/12/2015  CLINICAL DATA:  Productive cough and increasing anterior chest tightness for 2 weeks. Initial encounter. EXAM: CHEST  2 VIEW COMPARISON:  None. FINDINGS: The lungs are clear. Heart size is normal. There is no pneumothorax or pleural effusion. No focal bony abnormality  is identified. IMPRESSION: Negative chest. Electronically Signed   By: Inge Rise M.D.   On: 01/12/2015 19:03    EKG:   Orders placed or performed during the hospital encounter of 01/12/15  . EKG 12-Lead  . EKG 12-Lead    IMPRESSION AND PLAN:  Principal Problem:   Chest pain - strongly suspect costochondritis. However in the setting of family history of heart disease, Fabry's disease, and elevated troponin we will admit for serial troponins tonight, and consult cardiology for their recommendations in the morning. Active Problems:   Elevated troponin - mildly elevated at 0.08. We will recheck serially tonight.   HTN (hypertension) - normotensive at this time.  Hold home antihypertensives for now, can be restarted if his blood pressure rises.   Fabry disease ([place]) - causing hyperlipidemia. Lipids are elevated enough to interfere with the labs ability to run some of his blood tests. Patient is not on any antilipid medications at home. We will simply monitor this for now.  All the records are reviewed and case discussed with ED provider. Management plans discussed with the patient and/or family.  DVT PROPHYLAXIS: SubQ lovenox  ADMISSION STATUS: Observation  CODE STATUS: Full  TOTAL TIME TAKING CARE OF THIS PATIENT: 40 minutes.    Addelynn Batte FIELDING 01/12/2015, 10:23 PM  [facility]  Office  [phone]  CC: Primary care physician; [place]

## 2015-01-12 NOTE — ED Provider Notes (Signed)
[facility] Emergency Department Provider Note  Time seen: 8:27 PM  I have reviewed the triage vital signs and the nursing notes.   HISTORY  Chief Complaint Chest Pain    HPI Jeffery Hughes is a 49 y.o. male with a past medical history of hypertension, Fabry disease, who presents to the emergency department for 2 weeks of chest congestion and chest soreness. According to the patient he has had a mild cough and chest congestion for the past 2 weeks. He has been seen by his primary care doctor twice, prescribed antibiotics. States he continues have some soreness in the center of his chest. States no one did blood work or an EKG, he is concerned this could be his heart so he came to the emergency department. Denies any nausea, diaphoresis, shortness of breath. Describes his chest soreness is mild, centrally located.     Past Medical History  Diagnosis Date  . Fabry disease   . Fabry disease   . Hypertension     Patient Active Problem List   Diagnosis Date Noted  . Fabry disease ([place])     No past surgical history on file.  Current Outpatient Rx  Name  Route  Sig  Dispense  Refill  . agalsidase beta (FABRAZYME) 35 MG injection   Intravenous   Inject into the vein.         Marland Kitchen aspirin 81 MG tablet   Oral   Take 81 mg by mouth daily.         . benzonatate (TESSALON) 200 MG capsule   Oral   Take 200 mg by mouth 3 (three) times daily as needed for cough.         . clarithromycin (BIAXIN) 500 MG tablet   Oral   Take 500 mg by mouth 2 (two) times daily.         . cyanocobalamin 1000 MCG tablet   Oral   Take 100 mcg by mouth daily.         . diphenoxylate-atropine (LOMOTIL) 2.5-0.025 MG per tablet   Oral   Take by mouth.         . fexofenadine-pseudoephedrine (ALLEGRA-D) 60-120 MG per tablet   Oral   Take 1 tablet by mouth 2 (two) times daily.         . fluticasone (FLONASE) 50 MCG/ACT nasal spray   Nasal   Place into the nose.          . furosemide (LASIX) 40 MG tablet   Oral   Take 40 mg by mouth daily.         Marland Kitchen loratadine (CLARITIN) 10 MG tablet   Oral   Take by mouth.         . Multiple Vitamin (MULTIVITAMIN) tablet   Oral   Take 1 tablet by mouth daily.         . potassium chloride SA (K-DUR,KLOR-CON) 20 MEQ tablet   Oral   Take 20 mEq by mouth 2 (two) times daily.         . valsartan (DIOVAN) 160 MG tablet   Oral   Take 160 mg by mouth daily.           Allergies Penicillins  No family history on file.  Social History Social History  Substance Use Topics  . Smoking status: Never Smoker   . Smokeless tobacco: Not on file  . Alcohol Use: No    Review of Systems Constitutional: Negative for fever. Cardiovascular:  Positive for chest pain. Respiratory: Negative for shortness of breath. Positive for chest congestion. Gastrointestinal: Negative for abdominal pain, vomiting and diarrhea. Musculoskeletal: Negative for back pain Neurological: Negative for headache 10-point ROS otherwise negative.  ____________________________________________   PHYSICAL EXAM:  Constitutional: Alert and oriented. Well appearing and in no distress. Eyes: Normal exam ENT   Head: Normocephalic and atraumatic.   Mouth/Throat: Mucous membranes are moist. Cardiovascular: Normal rate, regular rhythm. No murmur Respiratory: Normal respiratory effort without tachypnea nor retractions. Breath sounds are clear and equal bilaterally. No wheezes/rales/rhonchi. Mild sternal chest tenderness to palpation. Gastrointestinal: Soft and nontender. No distention.   Musculoskeletal: Nontender with normal range of motion in all extremities. No lower extremity tenderness. Chronic left leg lymphedema, unchanged. Neurologic:  Normal speech and language. No gross focal neurologic deficits  Skin:  Skin is warm, dry and intact.  Psychiatric: Mood and affect are normal. Speech and behavior are normal.    ____________________________________________    EKG  EKG reviewed and interpreted by myself shows normal sinus rhythm at 72 bpm, widened QRS, otherwise normal intervals. Left ventricular hypertrophy. No ST elevations noted. Patient's EKG read appears to be unchanged from 08/13/14, however I am unable to physically view this EKG (care everywhere).  ____________________________________________    RADIOLOGY  Chest x-ray shows no acute abnormalities.  ____________________________________________   INITIAL IMPRESSION / ASSESSMENT AND PLAN / ED COURSE  Pertinent labs & imaging results that were available during my care of the patient were reviewed by me and considered in my medical decision making (see chart for details).  Patient presents for 2 weeks of chest congestion, with clear sputum production. Mild chest soreness. Patient has been seen by his primary care physician twice prescribed an antibiotic, he states this has helped with the cough but not with the chest soreness. Denies any pleuritic chest pain. No worse with deep breath. We will check labs including troponin. EKG is likely unchanged as the computer read it as identical to 08/13/14 however I am unable to physically view this EKG through care everywhere.  Troponin is coming back elevated 0.08. The patient's blood work is too lipemic to result most of the labs.  Patient's Fabry's disease puts the patient had a higher risk for myocardial infarction. Given his elevated troponin and persistent chest soreness we will admit to the hospital for further evaluation and workup.  ____________________________________________   FINAL CLINICAL IMPRESSION(S) / ED DIAGNOSES  Chest pain   Harvest Dark, MD 01/12/15 2136

## 2015-01-12 NOTE — ED Notes (Signed)
Seen twice for the chronic cough and chest pain in last two weeks. States worried its his heart, not the cough.

## 2015-01-13 ENCOUNTER — Observation Stay (HOSPITAL_BASED_OUTPATIENT_CLINIC_OR_DEPARTMENT_OTHER)
Admit: 2015-01-13 | Discharge: 2015-01-13 | Disposition: A | Discharge status: Home / Self Care | Payer: Medicare Other | Attending: Physician Assistant | Admitting: Physician Assistant

## 2015-01-13 DIAGNOSIS — R072 Precordial pain: Secondary | ICD-10-CM | POA: Diagnosis not present

## 2015-01-13 DIAGNOSIS — R079 Chest pain, unspecified: Secondary | ICD-10-CM | POA: Diagnosis not present

## 2015-01-13 DIAGNOSIS — R7989 Other specified abnormal findings of blood chemistry: Secondary | ICD-10-CM | POA: Diagnosis not present

## 2015-01-13 LAB — CBC
HEMATOCRIT: 41.5 % (ref 40.0–52.0)
HEMATOCRIT: 41.8 % (ref 40.0–52.0)
HEMOGLOBIN: 13.9 g/dL (ref 13.0–18.0)
HEMOGLOBIN: 14.2 g/dL (ref 13.0–18.0)
MCH: 29.2 pg (ref 26.0–34.0)
MCH: 29.7 pg (ref 26.0–34.0)
MCHC: 33.4 g/dL (ref 32.0–36.0)
MCHC: 34 g/dL (ref 32.0–36.0)
MCV: 87.2 fL (ref 80.0–100.0)
MCV: 87.2 fL (ref 80.0–100.0)
Platelets: 291 10*3/uL (ref 150–440)
Platelets: 264 10*3/uL (ref 150–440)
RBC: 4.79 MIL/uL (ref 4.40–5.90)
RBC: 4.75 MIL/uL (ref 4.40–5.90)
RDW: 13.3 % (ref 11.5–14.5)
RDW: 13.4 % (ref 11.5–14.5)
WBC: 8.1 10*3/uL (ref 3.8–10.6)
WBC: 9.5 10*3/uL (ref 3.8–10.6)

## 2015-01-13 LAB — BASIC METABOLIC PANEL WITH GFR
Anion gap: 4 — ABNORMAL LOW (ref 5–15)
BUN: 16 mg/dL (ref 6–20)
CO2: 26 mmol/L (ref 22–32)
Calcium: 8.9 mg/dL (ref 8.9–10.3)
Chloride: 108 mmol/L (ref 101–111)
Creatinine, Ser: 1.54 mg/dL — ABNORMAL HIGH (ref 0.61–1.24)
GFR calc Af Amer: 59 mL/min — ABNORMAL LOW
GFR calc non Af Amer: 51 mL/min — ABNORMAL LOW
Glucose, Bld: 99 mg/dL (ref 65–99)
Potassium: 3.9 mmol/L (ref 3.5–5.1)
Sodium: 138 mmol/L (ref 135–145)

## 2015-01-13 LAB — TROPONIN I
TROPONIN I: 0.09 ng/mL — AB (ref <0.031)
TROPONIN I: 0.08 ng/mL — AB (ref <0.031)
Troponin I: 0.08 ng/mL — ABNORMAL HIGH

## 2015-01-13 LAB — CREATININE, SERUM
Creatinine, Ser: 1.51 mg/dL — ABNORMAL HIGH (ref 0.61–1.24)
GFR calc Af Amer: >60 mL/min (ref >60)
GFR, EST NON AFRICAN AMERICAN: 52 mL/min — AB (ref >60)

## 2015-01-13 LAB — POTASSIUM: Potassium: 3.8 mmol/L (ref 3.5–5.1)

## 2015-01-13 MED ORDER — POTASSIUM CHLORIDE 20 MEQ PO PACK: 20.0000 meq | PACK | Freq: Once | ORAL | Status: DC

## 2015-01-13 MED ORDER — POTASSIUM CHLORIDE CRYS ER 20 MEQ PO TBCR
20.0000 meq | EXTENDED_RELEASE_TABLET | Freq: Every day | ORAL | Status: DC
  Administered 2015-01-14: 20 meq via ORAL
  Filled 2015-01-13: qty 1

## 2015-01-13 MED ORDER — METHYLPREDNISOLONE SODIUM SUCC 125 MG IJ SOLR
60.0000 mg | Freq: Once | INTRAMUSCULAR | Status: AC
  Administered 2015-01-13: 60 mg via INTRAVENOUS
  Filled 2015-01-13: qty 2

## 2015-01-13 MED ORDER — IRBESARTAN 75 MG PO TABS
37.5000 mg | ORAL_TABLET | Freq: Every day | ORAL | Status: DC
  Administered 2015-01-13 – 2015-01-14 (×2): 37.5 mg via ORAL
  Filled 2015-01-13 (×2): qty 1

## 2015-01-13 MED ORDER — ONDANSETRON HCL 4 MG PO TABS: 4.0000 mg | ORAL_TABLET | Freq: Four times a day (QID) | ORAL | Status: DC | PRN

## 2015-01-13 MED ORDER — ACETAMINOPHEN 325 MG PO TABS
650.0000 mg | ORAL_TABLET | Freq: Four times a day (QID) | ORAL | Status: DC | PRN
  Administered 2015-01-13 (×3): 650 mg via ORAL
  Filled 2015-01-13 (×2): qty 2

## 2015-01-13 MED ORDER — ENOXAPARIN SODIUM 40 MG/0.4ML ~~LOC~~ SOLN
40.0000 mg | SUBCUTANEOUS | Status: DC
  Administered 2015-01-13 (×2): 40 mg via SUBCUTANEOUS
  Filled 2015-01-13: qty 0.4

## 2015-01-13 MED ORDER — SODIUM CHLORIDE 0.9 % IJ SOLN
3.0000 mL | Freq: Two times a day (BID) | INTRAMUSCULAR | Status: DC
  Administered 2015-01-13 – 2015-01-14 (×4): 3 mL via INTRAVENOUS

## 2015-01-13 MED ORDER — FUROSEMIDE 40 MG PO TABS
40.0000 mg | ORAL_TABLET | Freq: Every day | ORAL | Status: DC
  Administered 2015-01-13 – 2015-01-14 (×2): 40 mg via ORAL
  Filled 2015-01-13 (×2): qty 1

## 2015-01-13 MED ORDER — ACETAMINOPHEN 650 MG RE SUPP: 650.0000 mg | Freq: Four times a day (QID) | RECTAL | Status: DC | PRN

## 2015-01-13 MED ORDER — LORATADINE 10 MG PO TABS
10.0000 mg | ORAL_TABLET | Freq: Every day | ORAL | Status: DC
  Administered 2015-01-13 – 2015-01-14 (×2): 10 mg via ORAL
  Filled 2015-01-13 (×2): qty 1

## 2015-01-13 MED ORDER — POTASSIUM CHLORIDE CRYS ER 20 MEQ PO TBCR
20.0000 meq | EXTENDED_RELEASE_TABLET | Freq: Once | ORAL | Status: AC
Start: 2015-01-13 — End: 2015-01-13
  Administered 2015-01-13: 20 meq via ORAL
  Filled 2015-01-13: qty 1

## 2015-01-13 MED ORDER — ONDANSETRON HCL 4 MG/2ML IJ SOLN: 4.0000 mg | Freq: Four times a day (QID) | INTRAMUSCULAR | Status: DC | PRN

## 2015-01-13 MED ORDER — ASPIRIN 81 MG PO CHEW
81.0000 mg | CHEWABLE_TABLET | Freq: Every day | ORAL | Status: DC
  Administered 2015-01-13 – 2015-01-14 (×2): 81 mg via ORAL
  Filled 2015-01-13 (×2): qty 1

## 2015-01-13 NOTE — Progress Notes (Signed)
Potassium level =3.8, Dr. Manuella Ghazi Vipul notified with a new order for Potassium 20 mEq oral x 1 dose. The medication has been administered and patient is resting right now in bed. The Potassium  Lab ordered by Dr. Max Sane was put in for Dr. Kenji Lobo by mistake.

## 2015-01-13 NOTE — Progress Notes (Signed)
*  PRELIMINARY RESULTS* Echocardiogram 2D Echocardiogram has been performed.  Jeffery Hughes 01/13/2015, 2:32 PM

## 2015-01-13 NOTE — Progress Notes (Addendum)
Patient is admitted to room 257 with a diagnosis of chest pain. Patient is A & O x 4. C/o chest soreness and requested for Tylenol 650 mg PRN  for that. No acute distress noted. Patient is NSR on the monitor. Skin assessment done with Cristopher Peru RN, no skin issues of concerns noted. Patient is oriented to his room, call bell/ascom and the  staff. Fall Neurosurgeon. Patient stated he takes 20 mEq of Potassium two times a day at home but did not take the second dose in the evening. Patient requested to get the skipped dose. Dr. Manuella Ghazi Vipul notified and he stated it wasn't a problem but we need to know his Potassium level before he could give him the dose. Lab was notified since most of his chemistry are showing unable to process. Lab has just came to re-draw his chemistry again and the RN is awaiting for the results. Wife at bedside, no issues. Will continue to monitor.

## 2015-01-13 NOTE — Progress Notes (Addendum)
Lab processed the lab  but the potassium was not included. The RN contacted the lab and they said they need  an order for Potassium in order to process it. DR Max Sane was notified again and received a  new order for potassium blood drawn. Lab is now processing the Potassium level now.

## 2015-01-13 NOTE — Progress Notes (Signed)
Patient ID: Jeffery Hughes, male   DOB: 1965/12/13, 49 y.o.   MRN: [id number] [facility] PROGRESS NOTE  PCP: [facility]  HPI/Subjective: Patient with chest pressure. He states that he's been battling an upper respiratory tract infection and chest cold. He recently finished Biaxin. Is been coughing up clear yellow phlegm. Chest pain is constant dull ache.  Objective: Filed Vitals:   01/13/15 1204  BP: 113/74  Pulse: 68  Temp: 97.8 F (36.6 C)  Resp: 16    Filed Weights   01/13/15 0014  Weight: 88.089 kg (194 lb 3.2 oz)    ROS: Review of Systems  Constitutional: Negative for fever and chills.  Eyes: Negative for blurred vision.  Respiratory: Negative for cough and shortness of breath.   Cardiovascular: Positive for chest pain.  Gastrointestinal: Negative for nausea, vomiting, abdominal pain, diarrhea and constipation.  Genitourinary: Negative for dysuria.  Musculoskeletal: Negative for joint pain.  Neurological: Negative for dizziness and headaches.   Exam: Physical Exam  Constitutional: He is oriented to person, place, and time.  HENT:  Nose: No mucosal edema.  Mouth/Throat: No oropharyngeal exudate or posterior oropharyngeal edema.  Eyes: Conjunctivae, EOM and lids are normal. Pupils are equal, round, and reactive to light.  Neck: No JVD present. Carotid bruit is not present. No edema present. No thyroid mass and no thyromegaly present.  Cardiovascular: S1 normal and S2 normal.  Exam reveals no gallop.   No murmur heard. Pulses:      Dorsalis pedis pulses are 2+ on the right side, and 2+ on the left side.  Respiratory: No respiratory distress. He has no wheezes. He has no rhonchi. He has no rales.  GI: Soft. Bowel sounds are normal. There is no tenderness.  Musculoskeletal:       Right ankle: He exhibits no swelling.       Left ankle: He exhibits no swelling.  Lymphadenopathy:    He has no cervical adenopathy.  Neurological: He is  alert and oriented to person, place, and time. No cranial nerve deficit.  Skin: Skin is warm. No rash noted. Nails show no clubbing.  Psychiatric: He has a normal mood and affect.    Data Reviewed: Basic Metabolic Panel:  Recent Labs Lab 01/12/15 1837 01/13/15 0036 01/13/15 0621  NA UNABLE TO REPORT DUE TO LIPEMIC INTERFERENCE  --  138  K UNABLE TO REPORT DUE TO LIPEMIC INTERFERENCE 3.8 3.9  CL 101  --  108  CO2 UNABLE TO REPORT DUE TO LIPEMIC INTERFERENCE  --  26  GLUCOSE UNABLE TO REPORT DUE TO LIPEMIC INTERFERENCE  --  99  BUN 15  --  16  CREATININE 1.56* 1.51* 1.54*  CALCIUM UNABLE TO REPORT DUE TO LIPEMIC INTERFERENCE  --  8.9   CBC:  Recent Labs Lab 01/12/15 1837 01/13/15 0036 01/13/15 0621  WBC 9.5 9.5 8.1  HGB 15.5 14.2 13.9  HCT 42.8 41.8 41.5  MCV 87.0 87.2 87.2  PLT 313 291 264   Cardiac Enzymes:  Recent Labs Lab 01/12/15 1837 01/13/15 0036 01/13/15 0621 01/13/15 1215  TROPONINI 0.08* 0.08* 0.09* 0.08*    Studies: Dg Chest 2 View  01/12/2015  CLINICAL DATA:  Productive cough and increasing anterior chest tightness for 2 weeks. Initial encounter. EXAM: CHEST  2 VIEW COMPARISON:  None. FINDINGS: The lungs are clear. Heart size is normal. There is no pneumothorax or pleural effusion. No focal bony abnormality is identified. IMPRESSION: Negative chest. Electronically Signed   By:  Inge Rise M.D.   On: 01/12/2015 19:03    Scheduled Meds: . aspirin  81 mg Oral Daily  . enoxaparin (LOVENOX) injection  40 mg Subcutaneous Q24H  . furosemide  40 mg Oral Daily  . irbesartan  37.5 mg Oral Daily  . loratadine  10 mg Oral Daily  . methylPREDNISolone (SOLU-MEDROL) injection  60 mg Intravenous Once  . potassium chloride  20 mEq Oral Daily  . sodium chloride  3 mL Intravenous Q12H    Assessment/Plan:  1. Chest pain with borderline elevation in troponin. Patient seen in consultation by cardiology and a stress test was ordered for tomorrow. In my  opinion, I think this is a postinfectious costochondritis. I will give a dose of Solu-Medrol now and prednisone tomorrow morning. Since chest x-ray is negative no need for further antibiotics.  2. Essential hypertension reorder his medications 3.   Fabry disease  Code Status:     Code Status Orders        Start     Ordered   01/13/15 0011  Full code   Continuous     01/13/15 0010     Disposition Plan: Likely home tomorrow if stress test is negative  Consultants:  Cardiology  Time spent: 30 minutes  Loletha Grayer  [facility]

## 2015-01-13 NOTE — Consult Note (Signed)
Cardiology Consultation Note  Patient ID: Jeffery Hughes, MRN: [id number], DOB/AGE: [birth year] 49 y.o. Admit date: 01/12/2015   Date of Consult: 01/13/2015 Primary Physician: [place] Primary Cardiologist: New to [facility]  Chief Complaint: Chest pain and SOB since URI in mid October  Reason for Consult: Chest pain and elevated troponin   HPI: 49 y.o. male with h/o Fabry disease since ~2003 to 2004 initially treated in [place], [place] and treated with Fabrazyme therapy. 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 He was found to have an initial troponin of 0.08 x 2. Cardiology was consulted for further evaluation.  Previously followed by [facility] for his Fabry disease. Records are not available at this time. He has been getting more SOB and fatigued over the past 6-12 months per his wife. Patient notes he will find himself having to sit/lay down when he previously would not have needed too. He chalked this up to "Fabry" and did not think any more of it. Weight has been stable. He has chronic lymphedema of the left lower extremity and wears a compression stocking along this leg and takes Lasix daily. He was recently treated for sinusitis/chest cold by PCP x 2 in to late October. Symptoms persisted and have led to chest pressure and worsening SOB causing him to present to [facility] for further evaluation as he has never had fatigue or chest pain like this before with his Fabry's. Over the past 3 days he has noted centralized chest pain that does not radiate and is described a pressure-like. Associated SOB. No nausea, vomiting, diaphoresis, palpitations, presyncope, or syncope. Weight has been stable. He has chronic left lower extremity lymphedema, this has been unchanged.   Upon the patient's arrival  to [facility] they were found to have a troponin of 0.08 x 2, SCr 1.54, K+ 3.9, CBC unremarkable . ECG showed NSR, 72 bpm, LVH, nonspecific QRS widening, peaked T waves, inferolateral TWI, CXR showed no acute cardiopulmonary process. He was admitted to cycle troponin level overnight and cardiology consult was placed for this morning. This morning he is asymptomatic.     Past Medical History  Diagnosis Date  . Fabry disease ([place])     a. initially diagnosed in [place], [place] in ~ 2003 to 2004, previously treated with Fabrazyme  . Hypertension   . Mixed hyperlipidemia   . CKD (chronic kidney disease), stage II     a. 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 LVH (left ventricular hypertrophy)   . Vitamin D deficiency       Most Recent Cardiac Studies: None   Surgical History:  Past Surgical History  Procedure Laterality Date  . No past surgeries       Home Meds: Prior to Admission medications   Medication Sig Start Date End Date Taking? Authorizing Provider  aspirin 81 MG tablet Take 81 mg by mouth daily.   Yes Historical Provider, MD  cyanocobalamin 1000 MCG tablet Take 100 mcg by mouth daily.   Yes Historical Provider, MD  diphenoxylate-atropine (LOMOTIL) 2.5-0.025 MG per tablet Take 2 tablets by mouth 4 (four) times daily as needed.  06/19/14  Yes Historical Provider, MD  fexofenadine-pseudoephedrine (ALLEGRA-D) 60-120 MG per tablet Take 1 tablet by mouth 2 (two) times daily.   Yes Historical Provider, MD  furosemide (  LASIX) 40 MG tablet Take 40 mg by mouth daily.   Yes Historical Provider, MD  loratadine (CLARITIN) 10 MG tablet Take 10 mg by mouth daily as needed.  05/14/14 05/14/15 Yes Historical Provider, MD  Multiple Vitamin (MULTIVITAMIN) tablet Take 1 tablet by mouth daily.   Yes Historical Provider, MD  potassium chloride SA (K-DUR,KLOR-CON) 20 MEQ tablet Take 20 mEq by mouth 2 (two) times daily.   Yes Historical Provider, MD  valsartan (DIOVAN) 160 MG tablet Take 160 mg by mouth daily. Pt takes  1 in the morning and 1/2 tablet every evening.   Yes Historical Provider, MD  clarithromycin (BIAXIN) 500 MG tablet Take 500 mg by mouth 2 (two) times daily.    Historical Provider, MD    Inpatient Medications:  . aspirin  81 mg Oral Daily  . enoxaparin (LOVENOX) injection  40 mg Subcutaneous Q24H  . sodium chloride  3 mL Intravenous Q12H      Allergies:  Allergies  Allergen Reactions  . Penicillins Other (See Comments)    unknown    Social History   Social History  . Marital Status: Married    Spouse Name: N/A  . Number of Children: N/A  . Years of Education: N/A   Occupational History  . Not on file.   Social History Main Topics  . Smoking status: Never Smoker   . Smokeless tobacco: Not on file  . Alcohol Use: No  . Drug Use: No  . Sexual Activity: Not on file   Other Topics Concern  . Not on file   Social History Narrative     Family History  Problem Relation Age of Onset  . Stroke Mother   . CAD Mother   . CAD Father   . Skin cancer Father      Review of Systems: Review of Systems  Constitutional: Positive for malaise/fatigue. Negative for fever, chills, weight loss and diaphoresis.  HENT: Positive for congestion and sore throat.        Sinus headache  Eyes: Negative for discharge and redness.  Respiratory: Positive for cough, sputum production, shortness of breath and wheezing. Negative for hemoptysis.        Clear to white sputum production   Cardiovascular: Positive for chest pain and leg swelling. Negative for palpitations, orthopnea, claudication and PND.       Chronic left lower extremity lymphedema  Gastrointestinal: Negative for heartburn, nausea, vomiting and abdominal pain.  Musculoskeletal: Negative for falls.  Skin: Negative for rash.  Neurological: Positive for weakness and headaches. Negative for dizziness, tingling, sensory change, speech change, focal weakness and loss of consciousness.  Endo/Heme/Allergies: Does not bruise/bleed  easily.  Psychiatric/Behavioral: Negative for suicidal ideas. The patient is not nervous/anxious.   All other systems reviewed and are negative.   Labs:  Recent Labs  01/12/15 1837 01/13/15 0036  TROPONINI 0.08* 0.08*   Lab Results  Component Value Date   WBC 8.1 01/13/2015   HGB 13.9 01/13/2015   HCT 41.5 01/13/2015   MCV 87.2 01/13/2015   PLT 264 01/13/2015     Recent Labs Lab 01/13/15 0621  NA 138  K 3.9  CL 108  CO2 26  BUN 16  CREATININE 1.54*  CALCIUM 8.9  GLUCOSE 99   No results found for: CHOL, HDL, LDLCALC, TRIG No results found for: DDIMER  Radiology/Studies:  Dg Chest 2 View  01/12/2015  CLINICAL DATA:  Productive cough and increasing anterior chest tightness for 2 weeks. Initial encounter. EXAM: CHEST  2 VIEW COMPARISON:  None. FINDINGS: The lungs are clear. Heart size is normal. There is no pneumothorax or pleural effusion. No focal bony abnormality is identified. IMPRESSION: Negative chest. Electronically Signed   By: Inge Rise M.D.   On: 01/12/2015 19:03    EKG: NSR, 72 bpm, LVH, nonspecific QRS widening, peaked T waves, inferolateral TWI  Weights: Filed Weights   01/13/15 0014  Weight: 194 lb 3.2 oz (88.089 kg)     Physical Exam: Blood pressure 116/63, pulse 55, temperature 98.4 F (36.9 C), temperature source Oral, resp. rate 20, weight 194 lb 3.2 oz (88.089 kg), SpO2 98 %. Body mass index is 26.89 kg/(m^2). General: Well developed, well nourished, in no acute distress. Head: Normocephalic, atraumatic, sclera non-icteric, no xanthomas, nares are without discharge.  Neck: Negative for carotid bruits. JVD not elevated. Lungs: Clear bilaterally to auscultation without wheezes, rales, or rhonchi. Breathing is unlabored. Heart: RRR with S1 S2. No murmurs, rubs, or gallops appreciated. Abdomen: Soft, non-tender, non-distended with normoactive bowel sounds. No hepatomegaly. No rebound/guarding. No obvious abdominal masses. Msk:  Strength  and tone appear normal for age. Extremities: No clubbing or cyanosis. Left lower extremity lymphedema.  Distal pedal pulses are 2+ and equal bilaterally. Neuro: Alert and oriented X 3. No facial asymmetry. No focal deficit. Moves all extremities spontaneously. Psych:  Responds to questions appropriately with a normal affect.    Assessment and Plan:  49 y.o. male with h/o Fabry disease since ~2003 to 2004 initially treated in [place], [place] and treated with Fabrazyme therapy. 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 He was found to have an initial troponin of 0.08 x 2.   1. Chest pain elevated troponin, increased dyspnea, and fatigue in the setting of Fabry disease: -There is limited data on the prevalence of troponin elevations in patients with Fabry disease -Troponin mildly elevated and flat trending -Best option at this time for this patient with Fabry is an echocardiogram to evaluate his LV function, valve function, and ascending aortic diameter   -Would also plan for Lexiscan Myoview on 11/14 to evaluate for high risk ischemia, though angina in Fabry patients typically appears to be caused by small vessel disease -Could plan for outpatient cardiac MRI    -High risk for conduction abnormalities given his LVH, monitor on telemetry treat as needed, bradycardic rates preclude usage of AV nodal blocking agents at this time -Symptoms appear to be signs of progression of his Fabry disease, treat any seen heart failure as vitals allow  -If echo shows heart failure would possibly be good candidate for advanced heart failure clinic to maximize therapy   2. CKD stage II: -In the setting of Fabry disease -Avoid nephrotoxic medications -Monitor  3. HTN: -Stable -Home antihypertensives held by IM     SignedChristell Faith,  PA-C Pager: [phone] 01/13/2015, 8:05 AM   History and all data above reviewed.  Patient examined.  I agree with the findings as above.  Chest pain following several days of a cold.  Pain is central and not like symptoms that he has had before.  He does have a flat nonspecific.  The patient exam reveals COR:RRR, no rub  ,  Lungs: Clear  ,  Abd: Positive bowel sounds, no rebound no guarding, Ext Mild left leg edema  .  All available labs, radiology testing, previous records reviewed. Agree with documented assessment and plan. Chest pain:  Predominantly atypical features but with abnormal nonspecific troponin.  Plan echo and Lexiscan Myoview as above.   Jeneen Rinks Jatara Huettner  11:15 AM  01/13/2015

## 2015-01-14 ENCOUNTER — Observation Stay (HOSPITAL_BASED_OUTPATIENT_CLINIC_OR_DEPARTMENT_OTHER): Payer: Medicare Other

## 2015-01-14 ENCOUNTER — Encounter: Payer: Self-pay | Admitting: Radiology

## 2015-01-14 DIAGNOSIS — R079 Chest pain, unspecified: Secondary | ICD-10-CM | POA: Diagnosis not present

## 2015-01-14 DIAGNOSIS — R072 Precordial pain: Secondary | ICD-10-CM | POA: Diagnosis not present

## 2015-01-14 DIAGNOSIS — E7521 Fabry (-Anderson) disease: Secondary | ICD-10-CM

## 2015-01-14 DIAGNOSIS — R0789 Other chest pain: Secondary | ICD-10-CM | POA: Diagnosis not present

## 2015-01-14 DIAGNOSIS — R7989 Other specified abnormal findings of blood chemistry: Secondary | ICD-10-CM | POA: Diagnosis not present

## 2015-01-14 LAB — NM MYOCAR MULTI W/SPECT W/WALL MOTION / EF
CHL CUP NUCLEAR SSS: 1
CHL CUP STRESS STAGE 1 GRADE: 0 %
CHL CUP STRESS STAGE 1 HR: 63 {beats}/min
CHL CUP STRESS STAGE 1 SPEED: 0 mph
CHL CUP STRESS STAGE 3 GRADE: 0 %
CHL CUP STRESS STAGE 3 SPEED: 0 mph
CHL CUP STRESS STAGE 4 GRADE: 0 %
CHL CUP STRESS STAGE 4 SPEED: 0 mph
CHL CUP STRESS STAGE 5 DBP: 71 mmHg
CSEPHR: 55 %
CSEPPHR: 93 {beats}/min
CSEPPMHR: 55 %
Estimated workload: 1 METS
LVDIAVOL: 128 mL
LVSYSVOL: 59 mL
NUC STRESS TID: 1.11
Rest HR: 66 {beats}/min
SDS: 1
SRS: 0
Stage 2 Grade: 0 %
Stage 2 HR: 69 {beats}/min
Stage 2 Speed: 0 mph
Stage 3 HR: 68 {beats}/min
Stage 4 HR: 93 {beats}/min
Stage 5 Grade: 0 %
Stage 5 HR: 74 {beats}/min
Stage 5 SBP: 135 mmHg
Stage 5 Speed: 0 mph

## 2015-01-14 MED ORDER — PREDNISONE 5 MG PO TABS: ORAL_TABLET | ORAL | Status: DC

## 2015-01-14 MED ORDER — TECHNETIUM TC 99M SESTAMIBI - CARDIOLITE
12.0600 | Freq: Once | INTRAVENOUS | Status: AC | PRN
  Administered 2015-01-14: 12.06 via INTRAVENOUS

## 2015-01-14 MED ORDER — TECHNETIUM TC 99M SESTAMIBI - CARDIOLITE
31.4700 | Freq: Once | INTRAVENOUS | Status: AC | PRN
  Administered 2015-01-14: 31.47 via INTRAVENOUS

## 2015-01-14 MED ORDER — PREDNISONE 20 MG PO TABS
20.0000 mg | ORAL_TABLET | Freq: Every day | ORAL | Status: DC
  Administered 2015-01-14: 20 mg via ORAL
  Filled 2015-01-14: qty 1

## 2015-01-14 MED ORDER — REGADENOSON 0.4 MG/5ML IV SOLN
0.4000 mg | Freq: Once | INTRAVENOUS | Status: AC
  Administered 2015-01-14: 0.4 mg via INTRAVENOUS
  Filled 2015-01-14: qty 5

## 2015-01-14 NOTE — Progress Notes (Signed)
Patient: Jeffery Hughes / Admit Date: 01/12/2015 / Date of Encounter: 01/14/2015, 8:54 AM   Subjective: Mild chest tightness overnight. None currently. Echo showed normal LV function and wall motion. He is for [employer] today.   Review of Systems: Review of Systems  Constitutional: Positive for malaise/fatigue. Negative for fever, chills, weight loss and diaphoresis.  HENT: Negative for congestion.   Eyes: Negative for discharge and redness.  Respiratory: Positive for shortness of breath. Negative for cough, hemoptysis, sputum production and wheezing.   Cardiovascular: Positive for chest pain and leg swelling. Negative for palpitations, orthopnea, claudication and PND.       Left lower extremity lymphedema   Gastrointestinal: Negative for nausea, vomiting and abdominal pain.  Musculoskeletal: Negative for falls.  Skin: Negative for rash.  Neurological: Positive for weakness. Negative for sensory change, speech change, focal weakness and loss of consciousness.  Endo/Heme/Allergies: Does not bruise/bleed easily.  Psychiatric/Behavioral: The patient is not nervous/anxious.     Objective: Telemetry: NSR, 80's Physical Exam: Blood pressure 113/65, pulse 72, temperature 98.2 F (36.8 C), temperature source Oral, resp. rate 20, height 5\' 8"  (1.727 m), weight 194 lb 3.2 oz (88.089 kg), SpO2 95 %. Body mass index is 29.54 kg/(m^2). General: Well developed, well nourished, in no acute distress. Head: Normocephalic, atraumatic, sclera non-icteric, no xanthomas, nares are without discharge. Neck: Negative for carotid bruits. JVP not elevated. Lungs: Clear bilaterally to auscultation without wheezes, rales, or rhonchi. Breathing is unlabored. Heart: RRR S1 S2 without murmurs, rubs, or gallops.  Abdomen: Soft, non-tender, non-distended with normoactive bowel sounds. No rebound/guarding. Extremities: No clubbing or cyanosis. Left lower extremity lymphedema. Distal pedal pulses are 2+ and  equal bilaterally. Neuro: Alert and oriented X 3. Moves all extremities spontaneously. Psych:  Responds to questions appropriately with a normal affect.   Intake/Output Summary (Last 24 hours) at 01/14/15 0854 Last data filed at 01/14/15 0500  Gross per 24 hour  Intake    600 ml  Output   1425 ml  Net   -825 ml    Inpatient Medications:  . aspirin  81 mg Oral Daily  . enoxaparin (LOVENOX) injection  40 mg Subcutaneous Q24H  . furosemide  40 mg Oral Daily  . irbesartan  37.5 mg Oral Daily  . loratadine  10 mg Oral Daily  . potassium chloride  20 mEq Oral Daily  . predniSONE  20 mg Oral Q breakfast  . sodium chloride  3 mL Intravenous Q12H   Infusions:    Labs:  Recent Labs  01/12/15 1837 01/13/15 0036 01/13/15 0621  NA UNABLE TO REPORT DUE TO LIPEMIC INTERFERENCE  --  138  K UNABLE TO REPORT DUE TO LIPEMIC INTERFERENCE 3.8 3.9  CL 101  --  108  CO2 UNABLE TO REPORT DUE TO LIPEMIC INTERFERENCE  --  26  GLUCOSE UNABLE TO REPORT DUE TO LIPEMIC INTERFERENCE  --  99  BUN 15  --  16  CREATININE 1.56* 1.51* 1.54*  CALCIUM UNABLE TO REPORT DUE TO LIPEMIC INTERFERENCE  --  8.9   No results for input(s): AST, ALT, ALKPHOS, BILITOT, PROT, ALBUMIN in the last 72 hours.  Recent Labs  01/13/15 0036 01/13/15 0621  WBC 9.5 8.1  HGB 14.2 13.9  HCT 41.8 41.5  MCV 87.2 87.2  PLT 291 264    Recent Labs  01/12/15 1837 01/13/15 0036 01/13/15 0621 01/13/15 1215  TROPONINI 0.08* 0.08* 0.09* 0.08*   Invalid input(s): POCBNP No results for input(s): HGBA1C  in the last 72 hours.   Weights: Filed Weights   01/13/15 0014  Weight: 194 lb 3.2 oz (88.089 kg)     Radiology/Studies:  Dg Chest 2 View  01/12/2015  CLINICAL DATA:  Productive cough and increasing anterior chest tightness for 2 weeks. Initial encounter. EXAM: CHEST  2 VIEW COMPARISON:  None. FINDINGS: The lungs are clear. Heart size is normal. There is no pneumothorax or pleural effusion. No focal bony abnormality  is identified. IMPRESSION: Negative chest. Electronically Signed   By: Inge Rise M.D.   On: 01/12/2015 19:03     Assessment and Plan  49 y.o. male with h/o Fabry disease since ~2003 to 2004 initially treated in [place], [place] and treated with Fabrazyme therapy. 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 He was found to have an initial troponin of 0.08 x 2, 0.09.   1. Chest pain elevated troponin, increased dyspnea, and fatigue in the setting of Fabry disease: -There is limited data on the prevalence of troponin elevations in patients with Fabry disease -Troponin mildly elevated and flat trending -Echo showed normal LV systolic function and wall motion. Aortic root was mildly dilated  -He is for [employer] today to evaluate for high risk ischemia  -Would plan for outpatient cardiac MRI to evaluate for cardiac structural abnormalities further   -High risk for conduction abnormalities given his LVH, monitor on telemetry treat as needed, bradycardic rates preclude usage of AV nodal blocking agents at this time -Symptoms appear to be signs of progression of his Fabry disease, treat any seen heart failure as vitals allow    2. CKD stage II: -In the setting of Fabry disease -Avoid nephrotoxic medications -Monitor  3. HTN: -Stable -Home antihypertensives held by IM   SignedChristell Faith, PA-C Pager: [phone] 01/14/2015, 8:54 AM

## 2015-01-14 NOTE — Discharge Summary (Signed)
[place] [facility] [place] NAME: Jeffery Hughes    MR#:  [id number]  DATE OF BIRTH:  1965-08-03  DATE OF ADMISSION:  01/12/2015 ADMITTING PHYSICIAN: Lance Coon, MD  DATE OF DISCHARGE: 01/14/2015  PRIMARY CARE PHYSICIAN: [facility]    ADMISSION DIAGNOSIS:  Chest pain, unspecified chest pain type [R07.9]  DISCHARGE DIAGNOSIS:  Principal Problem:   Chest pain Active Problems:   Fabry disease ([place])   Elevated troponin   HTN (hypertension)   SECONDARY DIAGNOSIS:   Past Medical History  Diagnosis Date  . Fabry disease ([place])     a. initially diagnosed in [place], [place] in ~ 2003 to 2004, previously treated with Fabrazyme  . Hypertension   . Mixed hyperlipidemia   . CKD (chronic kidney disease), stage II     a. 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 LVH (left ventricular hypertrophy)   . Vitamin D deficiency     HOSPITAL COURSE:   1. Chest pain, elevated troponin. Chest pain is continuous. Troponins only borderline elevated. Patient was seen in consultation by cardiology and they ordered a stress test. If the stress test is negative he will be discharged home today. I believe the chest wall pain is postinfectious costochondritis and I gave the patient IV Solu-Medrol yesterday and prednisone today. Chest pain is a little bit better today. I will give a prednisone taper upon discharge. 2. Essential hypertension- continue current medications 3. Chronic kidney disease stage III- continue current medications 4. Fabry disease   DISCHARGE CONDITIONS:   Satisfactory  CONSULTS OBTAINED:  Treatment Team:  Minus Breeding, MD  DRUG ALLERGIES:   Allergies  Allergen Reactions  . Penicillins Other (See Comments)    unknown    DISCHARGE MEDICATIONS:   Current Discharge Medication List    START taking these medications   Details  predniSONE (DELTASONE) 5 MG tablet 4 tabs day1; 3 tabs day2; 2 tabs day3; 1 tab day4,5  then stop Qty: 11 tablet, Refills: 0      CONTINUE these medications which have NOT CHANGED   Details  aspirin 81 MG tablet Take 81 mg by mouth daily.    cyanocobalamin 1000 MCG tablet Take 100 mcg by mouth daily.    diphenoxylate-atropine (LOMOTIL) 2.5-0.025 MG per tablet Take 2 tablets by mouth 4 (four) times daily as needed.     fexofenadine-pseudoephedrine (ALLEGRA-D) 60-120 MG per tablet Take 1 tablet by mouth 2 (two) times daily.    furosemide (LASIX) 40 MG tablet Take 40 mg by mouth daily.    loratadine (CLARITIN) 10 MG tablet Take 10 mg by mouth daily as needed.     Multiple Vitamin (MULTIVITAMIN) tablet Take 1 tablet by mouth daily.    potassium chloride SA (K-DUR,KLOR-CON) 20 MEQ tablet Take 20 mEq by mouth 2 (two) times daily.    valsartan (DIOVAN) 160 MG tablet Take 160 mg by mouth daily. Pt takes 1 in the morning and 1/2 tablet every evening.      STOP taking these medications     clarithromycin (BIAXIN) 500 MG tablet          DISCHARGE INSTRUCTIONS:   Follow-up with PMD one week  If you experience worsening of your admission symptoms, develop shortness of breath, life threatening emergency, suicidal or homicidal thoughts you must seek medical attention immediately by calling 911 or calling your MD immediately  if symptoms less severe.  You Must read complete instructions/literature along with all the possible adverse reactions/side  effects for all the Medicines you take and that have been prescribed to you. Take any new Medicines after you have completely understood and accept all the possible adverse reactions/side effects.   Please note  You were cared for by a hospitalist during your hospital stay. If you have any questions about your discharge medications or the care you received while you were in the hospital after you are discharged, you can call the unit and asked to speak with the hospitalist on call if the hospitalist that took care of you is not  available. Once you are discharged, your primary care physician will handle any further medical issues. Please note that NO REFILLS for any discharge medications will be authorized once you are discharged, as it is imperative that you return to your primary care physician (or establish a relationship with a primary care physician if you do not have one) for your aftercare needs so that they can reassess your need for medications and monitor your lab values.    Today   CHIEF COMPLAINT:   Chief Complaint  Patient presents with  . Chest Pain    x 2 weeks, cough productive white    HISTORY OF PRESENT ILLNESS:  Jeffery Hughes  is a 49 y.o. male presented with continuous chest pain and had a borderline troponin   VITAL SIGNS:  Blood pressure 127/76, pulse 61, temperature 97.8 F (36.6 C), temperature source Oral, resp. rate 16, height 5\' 8"  (1.727 m), weight 88.089 kg (194 lb 3.2 oz), SpO2 98 %.  I/O:   Intake/Output Summary (Last 24 hours) at 01/14/15 1021 Last data filed at 01/14/15 0500  Gross per 24 hour  Intake    240 ml  Output   1425 ml  Net  -1185 ml    PHYSICAL EXAMINATION:  GENERAL:  49 y.o.-year-old patient lying in the bed with no acute distress.  EYES: Pupils equal, round, reactive to light and accommodation. No scleral icterus. Extraocular muscles intact.  HEENT: Head atraumatic, normocephalic. Oropharynx and nasopharynx clear.  NECK:  Supple, no jugular venous distention. No thyroid enlargement, no tenderness.  LUNGS: Normal breath sounds bilaterally, no wheezing, rales,rhonchi or crepitation. No use of accessory muscles of respiration.  CARDIOVASCULAR: S1, S2 normal. No murmurs, rubs, or gallops. No chest wall pain today but yesterday did have reproducible chest pain. ABDOMEN: Soft, non-tender, non-distended. Bowel sounds present. No organomegaly or mass.  EXTREMITIES: No pedal edema, cyanosis, or clubbing.  NEUROLOGIC: Cranial nerves II through XII are intact. Muscle  strength 5/5 in all extremities. Sensation intact. Gait not checked.  PSYCHIATRIC: The patient is alert and oriented x 3.  SKIN: No obvious rash, lesion, or ulcer.   DATA REVIEW:   CBC  Recent Labs Lab 01/13/15 0621  WBC 8.1  HGB 13.9  HCT 41.5  PLT 264    Chemistries   Recent Labs Lab 01/13/15 0621  NA 138  K 3.9  CL 108  CO2 26  GLUCOSE 99  BUN 16  CREATININE 1.54*  CALCIUM 8.9    Cardiac Enzymes  Recent Labs Lab 01/13/15 1215  TROPONINI 0.08*      RADIOLOGY:  Dg Chest 2 View  01/12/2015  CLINICAL DATA:  Productive cough and increasing anterior chest tightness for 2 weeks. Initial encounter. EXAM: CHEST  2 VIEW COMPARISON:  None. FINDINGS: The lungs are clear. Heart size is normal. There is no pneumothorax or pleural effusion. No focal bony abnormality is identified. IMPRESSION: Negative chest. Electronically Signed   By:  Inge Rise M.D.   On: 01/12/2015 19:03    Management plans discussed with the patient, and he is in agreement.  CODE STATUS:     Code Status Orders        Start     Ordered   01/13/15 0011  Full code   Continuous     01/13/15 0010      TOTAL TIME TAKING CARE OF THIS PATIENT: 35 minutes.    Loletha Grayer M.D on 01/14/2015 at 10:21 AM  Between 7am to 6pm - Pager - [phone]  After 6pm go to [email or web address] - password [email or web address] [facility]  Office  [phone]  CC: Primary care physician; [place]

## 2015-01-14 NOTE — Discharge Instructions (Signed)
Chest Wall Pain °Chest wall pain is pain in or around the bones and muscles of your chest. Sometimes, an injury causes this pain. Sometimes, the cause may not be known. This pain may take several weeks or longer to get better. °HOME CARE °Pay attention to any changes in your symptoms. Take these actions to help with your pain: °· Rest as told by your doctor. °· Avoid activities that cause pain. Try not to use your chest, belly (abdominal), or side muscles to lift heavy things. °· If directed, apply ice to the painful area: °¨ Put ice in a plastic bag. °¨ Place a towel between your skin and the bag. °¨ Leave the ice on for 20 minutes, 2-3 times per day. °· Take over-the-counter and prescription medicines only as told by your doctor. °· Do not use tobacco products, including cigarettes, chewing tobacco, and e-cigarettes. If you need help quitting, ask your doctor. °· Keep all follow-up visits as told by your doctor. This is important. °GET HELP IF: °· You have a fever. °· Your chest pain gets worse. °· You have new symptoms. °GET HELP RIGHT AWAY IF: °· You feel sick to your stomach (nauseous) or you throw up (vomit). °· You feel sweaty or light-headed. °· You have a cough with phlegm (sputum) or you cough up blood. °· You are short of breath. °  °This information is not intended to replace advice given to you by your health care provider. Make sure you discuss any questions you have with your health care provider. °  °Document Released: 08/05/2007 Document Revised: 11/07/2014 Document Reviewed: 05/14/2014 °Elsevier Interactive Patient Education ©2016 [employer] ° °

## 2015-01-16 ENCOUNTER — Inpatient Hospital Stay: Payer: Medicare Other

## 2015-01-16 VITALS — BP 118/71 | HR 72 | Temp 97.5°F | Wt 200.4 lb

## 2015-01-16 DIAGNOSIS — E7521 Fabry (-Anderson) disease: Secondary | ICD-10-CM | POA: Diagnosis not present

## 2015-01-16 MED ORDER — SODIUM CHLORIDE 0.9 % IV SOLN
80.0000 mg | Freq: Once | INTRAVENOUS | Status: AC
  Administered 2015-01-16: 80 mg via INTRAVENOUS
  Filled 2015-01-16: qty 16

## 2015-01-16 MED ORDER — SODIUM CHLORIDE 0.9 % IV SOLN
Freq: Once | INTRAVENOUS | Status: AC
  Administered 2015-01-16: 10:00:00 via INTRAVENOUS
  Filled 2015-01-16: qty 1000

## 2015-01-16 MED ORDER — ACETAMINOPHEN 500 MG PO TABS: 1000.0000 mg | ORAL_TABLET | Freq: Once | ORAL | Status: DC

## 2015-01-30 ENCOUNTER — Inpatient Hospital Stay: Payer: Medicare Other

## 2015-02-01 ENCOUNTER — Ambulatory Visit: Payer: Medicare Other

## 2015-02-01 ENCOUNTER — Emergency Department
Admission: EM | Admit: 2015-02-01 | Discharge: 2015-02-01 | Disposition: A | Discharge status: Home / Self Care | Payer: Medicare Other | Attending: Emergency Medicine | Admitting: Emergency Medicine

## 2015-02-01 ENCOUNTER — Encounter: Payer: Self-pay | Admitting: Medical Oncology

## 2015-02-01 ENCOUNTER — Emergency Department: Payer: Medicare Other

## 2015-02-01 DIAGNOSIS — Z7982 Long term (current) use of aspirin: Secondary | ICD-10-CM | POA: Insufficient documentation

## 2015-02-01 DIAGNOSIS — Z880 Allergy status to penicillin: Secondary | ICD-10-CM | POA: Insufficient documentation

## 2015-02-01 DIAGNOSIS — Z79899 Other long term (current) drug therapy: Secondary | ICD-10-CM | POA: Insufficient documentation

## 2015-02-01 DIAGNOSIS — I129 Hypertensive chronic kidney disease with stage 1 through stage 4 chronic kidney disease, or unspecified chronic kidney disease: Secondary | ICD-10-CM | POA: Diagnosis not present

## 2015-02-01 DIAGNOSIS — R05 Cough: Secondary | ICD-10-CM | POA: Diagnosis present

## 2015-02-01 DIAGNOSIS — J209 Acute bronchitis, unspecified: Secondary | ICD-10-CM | POA: Diagnosis not present

## 2015-02-01 DIAGNOSIS — N182 Chronic kidney disease, stage 2 (mild): Secondary | ICD-10-CM | POA: Diagnosis not present

## 2015-02-01 MED ORDER — ALBUTEROL SULFATE HFA 108 (90 BASE) MCG/ACT IN AERS: 2.0000 | INHALATION_SPRAY | Freq: Four times a day (QID) | RESPIRATORY_TRACT | Status: DC | PRN

## 2015-02-01 MED ORDER — AZITHROMYCIN 250 MG PO TABS: ORAL_TABLET | ORAL | Status: DC

## 2015-02-01 MED ORDER — GUAIFENESIN-CODEINE 100-10 MG/5ML PO SOLN: 10.0000 mL | Freq: Three times a day (TID) | ORAL | Status: DC | PRN

## 2015-02-01 NOTE — Discharge Instructions (Signed)

## 2015-02-01 NOTE — ED Notes (Signed)
Pt reports that he has a continued cough x 1 month, was seen here recently and placed on steriods but has not improved. Pt reports production with cough.

## 2015-02-01 NOTE — ED Provider Notes (Signed)
[facility] Emergency Department Provider Note ____________________________________________  Time seen: Approximately 10:23 AM  I have reviewed the triage vital signs and the nursing notes.   HISTORY  Chief Complaint Cough   HPI Jeffery Hughes is a 49 y.o. male who presents to the emergency department for evaluation of cough x 1 month. Cough is productive of white sputum. He has had no relief with NyQuil or DayQuil. He denies fever.   Past Medical History  Diagnosis Date  . 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 initially diagnosed in [place], [place] in ~ 2003 to 2004, previously treated with Fabrazyme  . Hypertension   . Mixed hyperlipidemia   . CKD (chronic kidney disease), stage II     a. 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 LVH (left ventricular hypertrophy)   . Vitamin D deficiency     Patient Active Problem List   Diagnosis Date Noted  . Chest pain 01/12/2015  . Elevated troponin 01/12/2015  . HTN (hypertension) 01/12/2015  . Fabry disease [facility])     Past Surgical History  Procedure Laterality Date  . No past surgeries    . Knee surgery Left     20+ years ago, 6+ years ago    Current Outpatient Rx  Name  Route  Sig  Dispense  Refill  . albuterol (PROVENTIL HFA;VENTOLIN HFA) 108 (90 BASE) MCG/ACT inhaler   Inhalation   Inhale 2 puffs into the lungs every 6 (six) hours as needed for wheezing or shortness of breath.   1 Inhaler   2   . aspirin 81 MG tablet   Oral   Take 81 mg by mouth daily.         Marland Kitchen azithromycin (ZITHROMAX) 250 MG tablet      2 tablets today, then 1 tablet for the next 4 days.   6 each   0   . cyanocobalamin 1000 MCG tablet   Oral   Take 100 mcg by mouth daily.         . diphenoxylate-atropine (LOMOTIL) 2.5-0.025 MG per tablet   Oral   Take 2 tablets by mouth 4 (four) times daily as needed.          . fexofenadine-pseudoephedrine (ALLEGRA-D) 60-120 MG per tablet   Oral   Take 1 tablet by mouth 2 (two) times daily.        . furosemide (LASIX) 40 MG tablet   Oral   Take 40 mg by mouth daily.         Marland Kitchen guaiFENesin-codeine 100-10 MG/5ML syrup   Oral   Take 10 mLs by mouth 3 (three) times daily as needed.   120 mL   0   . loratadine (CLARITIN) 10 MG tablet   Oral   Take 10 mg by mouth daily as needed.          . Multiple Vitamin (MULTIVITAMIN) tablet   Oral   Take 1 tablet by mouth daily.         . potassium chloride SA (K-DUR,KLOR-CON) 20 MEQ tablet   Oral   Take 20 mEq by mouth 2 (two) times daily.         . predniSONE (DELTASONE) 5 MG tablet      4 tabs day1; 3 tabs day2; 2 tabs day3; 1 tab day4,5 then stop   11 tablet   0   . valsartan (DIOVAN) 160 MG tablet   Oral   Take 160 mg by mouth daily. Pt takes  1 in the morning and 1/2 tablet every evening.           Allergies Penicillins  Family History  Problem Relation Age of Onset  . Stroke Mother   . CAD Mother   . CAD Father   . Skin cancer Father     Social History Social History  Substance Use Topics  . Smoking status: Never Smoker   . Smokeless tobacco: None  . Alcohol Use: No    Review of Systems Constitutional: No fever/chills Eyes: No visual changes. ENT: No sore throat. Cardiovascular: Denies chest pain. Respiratory: Denies shortness of breath. Positive for productive cough Gastrointestinal: No abdominal pain.  No nausea, no vomiting.  No diarrhea.  No constipation. Genitourinary: Negative for dysuria. Musculoskeletal: Negative for back pain. Skin: Negative for rash. Neurological: Negative for headaches, focal weakness or numbness.  10-point ROS otherwise negative.  ____________________________________________   PHYSICAL EXAM:  VITAL SIGNS: ED Triage Vitals  Enc Vitals Group     BP 02/01/15 0855 154/92 mmHg     Pulse Rate 02/01/15 0855 79     Resp 02/01/15 0855 18     Temp 02/01/15 0855 98.2 F (36.8 C)     Temp Source 02/01/15 0855 Oral     SpO2 02/01/15 0855 97 %     Weight  02/01/15 0855 204 lb (92.534 kg)     Height 02/01/15 0855 5\' 9"  (1.753 m)     Head Cir --      Peak Flow --      Pain Score 02/01/15 0856 0     Pain Loc --      Pain Edu? --      Excl. in [place]? --     Constitutional: Alert and oriented. Well appearing and in no acute distress. Eyes: Conjunctivae are normal. PERRL. EOMI. Head: Atraumatic. Nose: No congestion/rhinnorhea. Mouth/Throat: Mucous membranes are moist.  Oropharynx non-erythematous. Neck: No stridor.   Cardiovascular: Normal rate, regular rhythm. Grossly normal heart sounds.  Good peripheral circulation. Respiratory: Normal respiratory effort.  No retractions. Lungs CTAB--diminished throughout. Gastrointestinal: Soft and nontender. No distention. No abdominal bruits. No CVA tenderness. Musculoskeletal: No lower extremity tenderness nor edema.  No joint effusions. Neurologic:  Normal speech and language. No gross focal neurologic deficits are appreciated. No gait instability. Skin:  Skin is warm, dry and intact. No rash noted. Psychiatric: Mood and affect are normal. Speech and behavior are normal.  ____________________________________________   LABS (all labs ordered are listed, but only abnormal results are displayed)  Labs Reviewed - No data to display ____________________________________________  EKG   ____________________________________________  RADIOLOGY  Chest x-ray negative for acute cardiopulmonary abnormality per radiology. ____________________________________________   PROCEDURES  Procedure(s) performed: None  Critical Care performed: No  ____________________________________________   INITIAL IMPRESSION / ASSESSMENT AND PLAN / ED COURSE  Pertinent labs & imaging results that were available during my care of the patient were reviewed by me and considered in my medical decision making (see chart for details).  Patient was encouraged to follow up with the primary care provider or schedule an  appointment with pulmonology for symptoms that are not improving over the week. He was encouraged to return to the emergency department for symptoms that change or worsen if he is unable schedule an appointment ____________________________________________   FINAL CLINICAL IMPRESSION(S) / ED DIAGNOSES  Final diagnoses:  Acute bronchitis, unspecified organism      Victorino Dike, FNP 02/01/15 1119  Orbie Pyo, MD 02/01/15 1540

## 2015-02-01 NOTE — ED Notes (Signed)
Assessment per PA 

## 2015-02-08 ENCOUNTER — Inpatient Hospital Stay: Payer: Medicare Other | Attending: Oncology

## 2015-02-08 VITALS — BP 134/71 | HR 69 | Temp 97.6°F | Resp 18

## 2015-02-08 DIAGNOSIS — E7521 Fabry (-Anderson) disease: Secondary | ICD-10-CM | POA: Diagnosis present

## 2015-02-08 DIAGNOSIS — Z79899 Other long term (current) drug therapy: Secondary | ICD-10-CM | POA: Diagnosis not present

## 2015-02-08 MED ORDER — ACETAMINOPHEN 500 MG PO TABS: 1000.0000 mg | ORAL_TABLET | Freq: Once | ORAL | Status: DC

## 2015-02-08 MED ORDER — SODIUM CHLORIDE 0.9 % IV SOLN
Freq: Once | INTRAVENOUS | Status: AC
  Administered 2015-02-08: 10:00:00 via INTRAVENOUS
  Filled 2015-02-08: qty 1000

## 2015-02-08 MED ORDER — SODIUM CHLORIDE 0.9 % IV SOLN
80.0000 mg | Freq: Once | INTRAVENOUS | Status: AC
  Administered 2015-02-08: 80 mg via INTRAVENOUS
  Filled 2015-02-08: qty 16

## 2015-02-13 ENCOUNTER — Inpatient Hospital Stay: Payer: Medicare Other

## 2015-02-20 ENCOUNTER — Inpatient Hospital Stay: Payer: Medicare Other

## 2015-02-20 VITALS — BP 124/70 | HR 62 | Temp 96.0°F | Resp 18 | Wt 201.2 lb

## 2015-02-20 DIAGNOSIS — E7521 Fabry (-Anderson) disease: Secondary | ICD-10-CM | POA: Diagnosis not present

## 2015-02-20 MED ORDER — SODIUM CHLORIDE 0.9 % IV SOLN: 1.0000 mg/kg | Freq: Once | INTRAVENOUS | Status: DC

## 2015-02-20 MED ORDER — SODIUM CHLORIDE 0.9 % IV SOLN
80.0000 mg | Freq: Once | INTRAVENOUS | Status: AC
  Administered 2015-02-20: 80 mg via INTRAVENOUS
  Filled 2015-02-20: qty 16

## 2015-02-20 MED ORDER — SODIUM CHLORIDE 0.9 % IV SOLN
Freq: Once | INTRAVENOUS | Status: AC
  Administered 2015-02-20: 10:00:00 via INTRAVENOUS
  Filled 2015-02-20: qty 1000

## 2015-02-20 MED ORDER — ACETAMINOPHEN 500 MG PO TABS
1000.0000 mg | ORAL_TABLET | Freq: Once | ORAL | Status: DC
  Filled 2015-02-20: qty 2

## 2015-02-22 ENCOUNTER — Ambulatory Visit: Payer: Medicare Other

## 2015-02-27 ENCOUNTER — Inpatient Hospital Stay: Payer: Medicare Other

## 2015-03-06 ENCOUNTER — Inpatient Hospital Stay: Payer: Medicare Other | Attending: Oncology

## 2015-03-06 VITALS — BP 120/65 | HR 70 | Temp 97.0°F | Resp 18

## 2015-03-06 DIAGNOSIS — Z79899 Other long term (current) drug therapy: Secondary | ICD-10-CM | POA: Insufficient documentation

## 2015-03-06 DIAGNOSIS — E7521 Fabry (-Anderson) disease: Secondary | ICD-10-CM | POA: Diagnosis not present

## 2015-03-06 MED ORDER — SODIUM CHLORIDE 0.9 % IV SOLN
80.0000 mg | Freq: Once | INTRAVENOUS | Status: AC
  Administered 2015-03-06: 80 mg via INTRAVENOUS
  Filled 2015-03-06: qty 16

## 2015-03-06 MED ORDER — ACETAMINOPHEN 500 MG PO TABS: 1000.0000 mg | ORAL_TABLET | Freq: Once | ORAL | Status: DC

## 2015-03-06 MED ORDER — SODIUM CHLORIDE 0.9 % IV SOLN
Freq: Once | INTRAVENOUS | Status: AC
  Administered 2015-03-06: 09:00:00 via INTRAVENOUS
  Filled 2015-03-06: qty 1000

## 2015-03-08 ENCOUNTER — Inpatient Hospital Stay: Payer: Medicare Other

## 2015-03-13 ENCOUNTER — Inpatient Hospital Stay: Payer: Medicare Other

## 2015-03-20 ENCOUNTER — Inpatient Hospital Stay: Payer: Medicare Other

## 2015-03-20 VITALS — BP 103/69 | HR 59 | Temp 97.3°F | Wt 200.8 lb

## 2015-03-20 DIAGNOSIS — E7521 Fabry (-Anderson) disease: Secondary | ICD-10-CM

## 2015-03-20 DIAGNOSIS — Z79899 Other long term (current) drug therapy: Secondary | ICD-10-CM | POA: Diagnosis not present

## 2015-03-20 MED ORDER — SODIUM CHLORIDE 0.9 % IV SOLN
80.0000 mg | Freq: Once | INTRAVENOUS | Status: AC
  Administered 2015-03-20: 80 mg via INTRAVENOUS
  Filled 2015-03-20: qty 16

## 2015-03-20 MED ORDER — SODIUM CHLORIDE 0.9 % IV SOLN
Freq: Once | INTRAVENOUS | Status: AC
  Administered 2015-03-20: 10:00:00 via INTRAVENOUS
  Filled 2015-03-20: qty 1000

## 2015-03-20 MED ORDER — ACETAMINOPHEN 500 MG PO TABS: 1000.0000 mg | ORAL_TABLET | Freq: Once | ORAL | Status: DC

## 2015-03-22 ENCOUNTER — Inpatient Hospital Stay: Payer: Medicare Other

## 2015-03-27 ENCOUNTER — Inpatient Hospital Stay: Payer: Medicare Other

## 2015-04-03 ENCOUNTER — Inpatient Hospital Stay: Payer: Medicare Other | Attending: Oncology

## 2015-04-03 VITALS — BP 147/78 | HR 59 | Temp 97.8°F | Resp 20 | Wt 199.7 lb

## 2015-04-03 DIAGNOSIS — Z79899 Other long term (current) drug therapy: Secondary | ICD-10-CM | POA: Insufficient documentation

## 2015-04-03 DIAGNOSIS — E7521 Fabry (-Anderson) disease: Secondary | ICD-10-CM | POA: Diagnosis not present

## 2015-04-03 MED ORDER — SODIUM CHLORIDE 0.9 % IV SOLN
80.0000 mg | Freq: Once | INTRAVENOUS | Status: DC
  Filled 2015-04-03: qty 16

## 2015-04-03 MED ORDER — SODIUM CHLORIDE 0.9 % IV SOLN
Freq: Once | INTRAVENOUS | Status: AC
  Administered 2015-04-03: 09:00:00 via INTRAVENOUS
  Filled 2015-04-03: qty 1000

## 2015-04-03 MED ORDER — SODIUM CHLORIDE 0.9 % IV SOLN
80.0000 mg | Freq: Once | INTRAVENOUS | Status: AC
  Administered 2015-04-03: 80 mg via INTRAVENOUS
  Filled 2015-04-03: qty 16

## 2015-04-03 MED ORDER — ACETAMINOPHEN 500 MG PO TABS: 1000.0000 mg | ORAL_TABLET | Freq: Once | ORAL | Status: DC

## 2015-04-05 ENCOUNTER — Inpatient Hospital Stay: Payer: Medicare Other

## 2015-04-10 ENCOUNTER — Inpatient Hospital Stay: Payer: Medicare Other

## 2015-04-17 ENCOUNTER — Inpatient Hospital Stay: Payer: Medicare Other

## 2015-04-17 VITALS — BP 149/87 | HR 80 | Temp 96.3°F | Resp 20 | Wt 195.8 lb

## 2015-04-17 DIAGNOSIS — E7521 Fabry (-Anderson) disease: Secondary | ICD-10-CM

## 2015-04-17 DIAGNOSIS — Z79899 Other long term (current) drug therapy: Secondary | ICD-10-CM | POA: Diagnosis not present

## 2015-04-17 MED ORDER — SODIUM CHLORIDE 0.9 % IV SOLN
Freq: Once | INTRAVENOUS | Status: AC
  Administered 2015-04-17: 09:00:00 via INTRAVENOUS
  Filled 2015-04-17: qty 1000

## 2015-04-17 MED ORDER — SODIUM CHLORIDE 0.9 % IV SOLN
80.0000 mg | Freq: Once | INTRAVENOUS | Status: AC
  Administered 2015-04-17: 80 mg via INTRAVENOUS
  Filled 2015-04-17: qty 16

## 2015-04-19 ENCOUNTER — Inpatient Hospital Stay: Payer: Medicare Other

## 2015-04-24 ENCOUNTER — Inpatient Hospital Stay: Payer: Medicare Other

## 2015-05-01 ENCOUNTER — Inpatient Hospital Stay: Payer: Medicare Other | Attending: Oncology

## 2015-05-01 VITALS — BP 119/63 | HR 72 | Temp 96.6°F

## 2015-05-01 DIAGNOSIS — Z79899 Other long term (current) drug therapy: Secondary | ICD-10-CM | POA: Insufficient documentation

## 2015-05-01 DIAGNOSIS — E7521 Fabry (-Anderson) disease: Secondary | ICD-10-CM | POA: Insufficient documentation

## 2015-05-01 MED ORDER — AGALSIDASE BETA 5 MG IV SOLR
80.0000 mg | Freq: Once | INTRAVENOUS | Status: AC
  Administered 2015-05-01: 80 mg via INTRAVENOUS
  Filled 2015-05-01: qty 16

## 2015-05-01 MED ORDER — ACETAMINOPHEN 500 MG PO TABS: 1000.0000 mg | ORAL_TABLET | Freq: Once | ORAL | Status: DC

## 2015-05-01 MED ORDER — SODIUM CHLORIDE 0.9 % IV SOLN
Freq: Once | INTRAVENOUS | Status: AC
  Administered 2015-05-01: 09:00:00 via INTRAVENOUS
  Filled 2015-05-01: qty 1000

## 2015-05-03 ENCOUNTER — Inpatient Hospital Stay: Payer: Medicare Other

## 2015-05-08 ENCOUNTER — Inpatient Hospital Stay: Payer: Medicare Other

## 2015-05-09 DIAGNOSIS — E7521 Fabry (-Anderson) disease: Secondary | ICD-10-CM | POA: Diagnosis not present

## 2015-05-09 DIAGNOSIS — N189 Chronic kidney disease, unspecified: Secondary | ICD-10-CM | POA: Diagnosis not present

## 2015-05-09 DIAGNOSIS — E559 Vitamin D deficiency, unspecified: Secondary | ICD-10-CM | POA: Diagnosis not present

## 2015-05-09 DIAGNOSIS — I517 Cardiomegaly: Secondary | ICD-10-CM | POA: Diagnosis not present

## 2015-05-09 DIAGNOSIS — E782 Mixed hyperlipidemia: Secondary | ICD-10-CM | POA: Diagnosis not present

## 2015-05-15 ENCOUNTER — Inpatient Hospital Stay: Payer: Medicare Other

## 2015-05-15 DIAGNOSIS — E7521 Fabry (-Anderson) disease: Secondary | ICD-10-CM

## 2015-05-15 DIAGNOSIS — Z79899 Other long term (current) drug therapy: Secondary | ICD-10-CM | POA: Diagnosis not present

## 2015-05-15 MED ORDER — SODIUM CHLORIDE 0.9 % IV SOLN
Freq: Once | INTRAVENOUS | Status: AC
  Administered 2015-05-15: 09:00:00 via INTRAVENOUS
  Filled 2015-05-15: qty 1000

## 2015-05-15 MED ORDER — SODIUM CHLORIDE 0.9 % IV SOLN
80.0000 mg | Freq: Once | INTRAVENOUS | Status: AC
  Administered 2015-05-15: 80 mg via INTRAVENOUS
  Filled 2015-05-15: qty 16

## 2015-05-15 MED ORDER — ACETAMINOPHEN 500 MG PO TABS: 1000.0000 mg | ORAL_TABLET | Freq: Once | ORAL | Status: DC

## 2015-05-17 ENCOUNTER — Inpatient Hospital Stay: Payer: Medicare Other

## 2015-05-21 ENCOUNTER — Emergency Department
Admission: EM | Admit: 2015-05-21 | Discharge: 2015-05-21 | Disposition: A | Discharge status: Home / Self Care | Payer: Medicare Other | Attending: Emergency Medicine | Admitting: Emergency Medicine

## 2015-05-21 DIAGNOSIS — N182 Chronic kidney disease, stage 2 (mild): Secondary | ICD-10-CM | POA: Diagnosis not present

## 2015-05-21 DIAGNOSIS — I129 Hypertensive chronic kidney disease with stage 1 through stage 4 chronic kidney disease, or unspecified chronic kidney disease: Secondary | ICD-10-CM | POA: Insufficient documentation

## 2015-05-21 DIAGNOSIS — E876 Hypokalemia: Secondary | ICD-10-CM | POA: Diagnosis not present

## 2015-05-21 DIAGNOSIS — I517 Cardiomegaly: Secondary | ICD-10-CM | POA: Insufficient documentation

## 2015-05-21 DIAGNOSIS — E782 Mixed hyperlipidemia: Secondary | ICD-10-CM | POA: Insufficient documentation

## 2015-05-21 DIAGNOSIS — Z79899 Other long term (current) drug therapy: Secondary | ICD-10-CM | POA: Diagnosis not present

## 2015-05-21 DIAGNOSIS — R197 Diarrhea, unspecified: Secondary | ICD-10-CM | POA: Diagnosis not present

## 2015-05-21 LAB — COMPREHENSIVE METABOLIC PANEL
ALK PHOS: 62 U/L (ref 38–126)
ALT: 86 U/L — AB (ref 17–63)
AST: 55 U/L — ABNORMAL HIGH (ref 15–41)
Albumin: 3.7 g/dL (ref 3.5–5.0)
Anion gap: 7 (ref 5–15)
BUN: 16 mg/dL (ref 6–20)
CALCIUM: 9 mg/dL (ref 8.9–10.3)
CO2: 17 mmol/L — ABNORMAL LOW (ref 22–32)
CREATININE: 1.62 mg/dL — AB (ref 0.61–1.24)
Chloride: 109 mmol/L (ref 101–111)
GFR, EST AFRICAN AMERICAN: 55 mL/min — AB (ref >60)
GFR, EST NON AFRICAN AMERICAN: 48 mL/min — AB (ref >60)
Glucose, Bld: 96 mg/dL (ref 65–99)
Potassium: 3.1 mmol/L — ABNORMAL LOW (ref 3.5–5.1)
Sodium: 133 mmol/L — ABNORMAL LOW (ref 135–145)
TOTAL PROTEIN: 7.7 g/dL (ref 6.5–8.1)
Total Bilirubin: 0.8 mg/dL (ref 0.3–1.2)

## 2015-05-21 LAB — CBC
HEMATOCRIT: 43.4 % (ref 40.0–52.0)
Hemoglobin: 14.7 g/dL (ref 13.0–18.0)
MCH: 29 pg (ref 26.0–34.0)
MCHC: 33.8 g/dL (ref 32.0–36.0)
MCV: 85.9 fL (ref 80.0–100.0)
Platelets: 370 10*3/uL (ref 150–440)
RBC: 5.05 MIL/uL (ref 4.40–5.90)
RDW: 13.7 % (ref 11.5–14.5)
WBC: 7 10*3/uL (ref 3.8–10.6)

## 2015-05-21 LAB — LIPASE, BLOOD: Lipase: 30 U/L (ref 11–51)

## 2015-05-21 MED ORDER — POTASSIUM CHLORIDE CRYS ER 20 MEQ PO TBCR
40.0000 meq | EXTENDED_RELEASE_TABLET | Freq: Once | ORAL | Status: AC
  Administered 2015-05-21: 40 meq via ORAL
  Filled 2015-05-21: qty 2

## 2015-05-21 MED ORDER — CIPROFLOXACIN HCL 500 MG PO TABS
500.0000 mg | ORAL_TABLET | Freq: Two times a day (BID) | ORAL | Status: AC
Start: 2015-05-21 — End: 2015-05-28

## 2015-05-21 NOTE — Discharge Instructions (Signed)
As we discussed, your diarrhea may be the result of a viral illness.  But since it has been persistent for 5 days and is significantly impacting your life, we have prescribed for you a 3 day course of Cipro which may help if you have picked up a bacterial illness which is causing the diarrhea.  We also encourage you to take over-the-counter loperamide (Imodium) according to the label instructions; although this may not have worked for you in the past, it may be successful for you this time and may work in conjunction with the [place].  Be sure to continue taking your regular medications including your potassium chloride supplements since your potassium is a little bit low today.  We gave you an extra dose of 40 mEq in the emergency department.  Though you are having a bowel movement every time you eat or drink anything, it is important that you continue to do so so that he stay hydrated, and there is good evidence to suggest that going back to eating foods and not just liquids will help resolve the diarrhea.  Please read through the included information about foods that may help with diarrhea, and remember the "old standards":  white bread/toast, white rice, bananas, Saltine crackers, chicken broth, Gatorade, ginger ale.   Diarrhea Diarrhea is frequent loose and watery bowel movements. It can cause you to feel weak and dehydrated. Dehydration can cause you to become tired and thirsty, have a dry mouth, and have decreased urination that often is dark yellow. Diarrhea is a sign of another problem, most often an infection that will not last long. In most cases, diarrhea typically lasts 2-3 days. However, it can last longer if it is a sign of something more serious. It is important to treat your diarrhea as directed by your caregiver to lessen or prevent future episodes of diarrhea. CAUSES  Some common causes include:  Gastrointestinal infections caused by viruses, bacteria, or parasites.  Food poisoning or  food allergies.  Certain medicines, such as antibiotics, chemotherapy, and laxatives.  Artificial sweeteners and fructose.  Digestive disorders. HOME CARE INSTRUCTIONS  Ensure adequate fluid intake (hydration): Have 1 cup (8 oz) of fluid for each diarrhea episode. Avoid fluids that contain simple sugars or sports drinks, fruit juices, whole milk products, and sodas. Your urine should be clear or pale yellow if you are drinking enough fluids. Hydrate with an oral rehydration solution that you can purchase at pharmacies, retail stores, and online. You can prepare an oral rehydration solution at home by mixing the following ingredients together:   - tsp table salt.   tsp baking soda.   tsp salt substitute containing potassium chloride.  1  tablespoons sugar.  1 L (34 oz) of water.  Certain foods and beverages may increase the speed at which food moves through the gastrointestinal (GI) tract. These foods and beverages should be avoided and include:  Caffeinated and alcoholic beverages.  High-fiber foods, such as raw fruits and vegetables, nuts, seeds, and whole grain breads and cereals.  Foods and beverages sweetened with sugar alcohols, such as xylitol, sorbitol, and mannitol.  Some foods may be well tolerated and may help thicken stool including:  Starchy foods, such as rice, toast, pasta, low-sugar cereal, oatmeal, grits, baked potatoes, crackers, and bagels.  Bananas.  Applesauce.  Add probiotic-rich foods to help increase healthy bacteria in the GI tract, such as yogurt and fermented milk products.  Wash your hands well after each diarrhea episode.  Only take over-the-counter or  prescription medicines as directed by your caregiver.  Take a warm bath to relieve any burning or pain from frequent diarrhea episodes. SEEK IMMEDIATE MEDICAL CARE IF:   You are unable to keep fluids down.  You have persistent vomiting.  You have blood in your stool, or your stools are  black and tarry.  You do not urinate in 6-8 hours, or there is only a small amount of very dark urine.  You have abdominal pain that increases or localizes.  You have weakness, dizziness, confusion, or light-headedness.  You have a severe headache.  Your diarrhea gets worse or does not get better.  You have a fever or persistent symptoms for more than 2-3 days.  You have a fever and your symptoms suddenly get worse. MAKE SURE YOU:   Understand these instructions.  Will watch your condition.  Will get help right away if you are not doing well or get worse.   This information is not intended to replace advice given to you by your health care provider. Make sure you discuss any questions you have with your health care provider.   Document Released: 02/06/2002 Document Revised: 03/09/2014 Document Reviewed: 10/25/2011 Elsevier Interactive Patient Education 2016 [place] Choices to Help Relieve Diarrhea, Adult When you have diarrhea, the foods you eat and your eating habits are very important. Choosing the right foods and drinks can help relieve diarrhea. Also, because diarrhea can last up to 7 days, you need to replace lost fluids and electrolytes (such as sodium, potassium, and chloride) in order to help prevent dehydration.  WHAT GENERAL GUIDELINES DO I NEED TO FOLLOW?  Slowly drink 1 cup (8 oz) of fluid for each episode of diarrhea. If you are getting enough fluid, your urine will be clear or pale yellow.  Eat starchy foods. Some good choices include white rice, white toast, pasta, low-fiber cereal, baked potatoes (without the skin), saltine crackers, and bagels.  Avoid large servings of any cooked vegetables.  Limit fruit to two servings per day. A serving is  cup or 1 small piece.  Choose foods with less than 2 g of fiber per serving.  Limit fats to less than 8 tsp (38 g) per day.  Avoid fried foods.  Eat foods that have probiotics in them. Probiotics can  be found in certain dairy products.  Avoid foods and beverages that may increase the speed at which food moves through the stomach and intestines (gastrointestinal tract). Things to avoid include:  High-fiber foods, such as dried fruit, raw fruits and vegetables, nuts, seeds, and whole grain foods.  Spicy foods and high-fat foods.  Foods and beverages sweetened with high-fructose corn syrup, honey, or sugar alcohols such as xylitol, sorbitol, and mannitol. WHAT FOODS ARE RECOMMENDED? Grains White rice. White, [place], or pita breads (fresh or toasted), including plain rolls, buns, or bagels. White pasta. Saltine, soda, or graham crackers. Pretzels. Low-fiber cereal. Cooked cereals made with water (such as cornmeal, farina, or cream cereals). Plain muffins. Matzo. Melba toast. Zwieback.  Vegetables Potatoes (without the skin). Strained tomato and vegetable juices. Most well-cooked and canned vegetables without seeds. Tender lettuce. Fruits Cooked or canned applesauce, apricots, cherries, fruit cocktail, grapefruit, peaches, pears, or plums. Fresh bananas, apples without skin, cherries, grapes, cantaloupe, grapefruit, peaches, oranges, or plums.  Meat and Other Protein Products Baked or boiled chicken. Eggs. Tofu. Fish. Seafood. Smooth peanut butter. Ground or well-cooked tender beef, ham, veal, lamb, pork, or poultry.  Dairy Plain yogurt, kefir, and unsweetened  liquid yogurt. Lactose-free milk, buttermilk, or soy milk. Plain hard cheese. Beverages Sport drinks. Clear broths. Diluted fruit juices (except prune). Regular, caffeine-free sodas such as ginger ale. Water. Decaffeinated teas. Oral rehydration solutions. Sugar-free beverages not sweetened with sugar alcohols. Other Bouillon, broth, or soups made from recommended foods.  The items listed above may not be a complete list of recommended foods or beverages. Contact your dietitian for more options. WHAT FOODS ARE NOT  RECOMMENDED? Grains Whole grain, whole wheat, bran, or rye breads, rolls, pastas, crackers, and cereals. Wild or brown rice. Cereals that contain more than 2 g of fiber per serving. Corn tortillas or taco shells. Cooked or dry oatmeal. Granola. Popcorn. Vegetables Raw vegetables. Cabbage, broccoli, Brussels sprouts, artichokes, baked beans, beet greens, corn, kale, legumes, peas, sweet potatoes, and yams. Potato skins. Cooked spinach and cabbage. Fruits Dried fruit, including raisins and dates. Raw fruits. Stewed or dried prunes. Fresh apples with skin, apricots, mangoes, pears, raspberries, and strawberries.  Meat and Other Protein Products Chunky peanut butter. Nuts and seeds. Beans and lentils. Berniece Salines.  Dairy High-fat cheeses. Milk, chocolate milk, and beverages made with milk, such as milk shakes. Cream. Ice cream. Sweets and Desserts Sweet rolls, doughnuts, and sweet breads. Pancakes and waffles. Fats and Oils Butter. Cream sauces. Margarine. Salad oils. Plain salad dressings. Olives. Avocados.  Beverages Caffeinated beverages (such as coffee, tea, soda, or energy drinks). Alcoholic beverages. Fruit juices with pulp. Prune juice. Soft drinks sweetened with high-fructose corn syrup or sugar alcohols. Other Coconut. Hot sauce. Chili powder. Mayonnaise. Gravy. Cream-based or milk-based soups.  The items listed above may not be a complete list of foods and beverages to avoid. Contact your dietitian for more information. WHAT SHOULD I DO IF I BECOME DEHYDRATED? Diarrhea can sometimes lead to dehydration. Signs of dehydration include dark urine and dry mouth and skin. If you think you are dehydrated, you should rehydrate with an oral rehydration solution. These solutions can be purchased at pharmacies, retail stores, or online.  Drink -1 cup (120-240 mL) of oral rehydration solution each time you have an episode of diarrhea. If drinking this amount makes your diarrhea worse, try drinking smaller  amounts more often. For example, drink 1-3 tsp (5-15 mL) every 5-10 minutes.  A general rule for staying hydrated is to drink 1-2 L of fluid per day. Talk to your health care provider about the specific amount you should be drinking each day. Drink enough fluids to keep your urine clear or pale yellow.   This information is not intended to replace advice given to you by your health care provider. Make sure you discuss any questions you have with your health care provider.   Document Released: 05/09/2003 Document Revised: 03/09/2014 Document Reviewed: 01/09/2013 Elsevier Interactive Patient Education 2016 Elsevier Inc[employer]  Probiotics WHAT ARE PROBIOTICS? Probiotics are the good bacteria and yeasts that live in your body and keep you and your digestive system healthy. Probiotics also help your body's defense (immune) system and protect your body against bad bacterial growth.  Certain foods contain probiotics, such as yogurt. Probiotics can also be purchased as a supplement. As with any supplement or drug, it is important to discuss its use with your health care provider.  WHAT AFFECTS THE BALANCE OF BACTERIA IN MY BODY? The balance of bacteria in your body can be affected by:   Antibiotic medicines. Antibiotics are sometimes necessary to treat infection. Unfortunately, they may kill good or friendly bacteria in your body as well as the  bad bacteria. This may lead to stomach problems like diarrhea, gas, and cramping.  Disease. Some conditions are the result of an overgrowth of bad bacteria, yeasts, parasites, or fungi. These conditions include:   Infectious diarrhea.  Stomach and respiratory infections.  Skin infections.  Irritable bowel syndrome (IBS).  Inflammatory bowel diseases.  Ulcer due to Helicobacter pylori (H. pylori) infection.  Tooth decay and periodontal disease.  Vaginal infections. Stress and poor diet may also lower the good bacteria in your body.  WHAT TYPE OF  PROBIOTIC IS RIGHT FOR ME? Probiotics are available over the counter at your local pharmacy, health food, or grocery store. They come in many different forms, combinations of strains, and dosing strengths. Some may need to be refrigerated. Always read the label for storage and usage instructions. Specific strains have been shown to be more effective for certain conditions. Ask your health care provider what option is best for you.  WHY WOULD I NEED PROBIOTICS? There are many reasons your health care provider might recommend a probiotic supplement, including:   Diarrhea.  Constipation.  IBS.  Respiratory infections.  Yeast infections.  Acne, eczema, and other skin conditions.  Frequent urinary tract infections (UTIs). ARE THERE SIDE EFFECTS OF PROBIOTICS? Some people experience mild side effects when taking probiotics. Side effects are usually temporary and may include:   Gas.  Bloating.  Cramping. Rarely, serious side effects, such as infection or immune system changes, may occur. WHAT ELSE DO I NEED TO KNOW ABOUT PROBIOTICS?   There are many different strains of probiotics. Certain strains may be more effective depending on your condition. Probiotics are available in varying doses. Ask your health care provider which probiotic you should use and how often.   If you are taking probiotics along with antibiotics, it is generally recommended to wait at least 2 hours between taking the antibiotic and taking the probiotic.  FOR MORE INFORMATION:  [email or web address]   This information is not intended to replace advice given to you by your health care provider. Make sure you discuss any questions you have with your health care provider.   Document Released: 09/13/2013 Document Reviewed: 09/13/2013 Elsevier Interactive Patient Education 2016 [employer].  Hypokalemia Hypokalemia means that the amount of potassium in  the blood is lower than normal.Potassium is a chemical, called an electrolyte, that helps regulate the amount of fluid in the body. It also stimulates muscle contraction and helps nerves function properly.Most of the body's potassium is inside of cells, and only a very small amount is in the blood. Because the amount in the blood is so small, minor changes can be life-threatening. CAUSES  Antibiotics.  Diarrhea or vomiting.  Using laxatives too much, which can cause diarrhea.  Chronic kidney disease.  Water pills (diuretics).  Eating disorders (bulimia).  Low magnesium level.  Sweating a lot. SIGNS AND SYMPTOMS  Weakness.  Constipation.  Fatigue.  Muscle cramps.  Mental confusion.  Skipped heartbeats or irregular heartbeat (palpitations).  Tingling or numbness. DIAGNOSIS  Your health care provider can diagnose hypokalemia with blood tests. In addition to checking your potassium level, your health care provider may also check other lab tests. TREATMENT Hypokalemia can be treated with potassium supplements taken by mouth or adjustments in your current medicines. If your potassium level is very low, you may need to get potassium through a vein (IV) and be monitored in the hospital. A diet high in potassium is also helpful. Foods high in  potassium are:  Nuts, such as peanuts and pistachios.  Seeds, such as sunflower seeds and pumpkin seeds.  Peas, lentils, and lima beans.  Whole grain and bran cereals and breads.  Fresh fruit and vegetables, such as apricots, avocado, bananas, cantaloupe, kiwi, oranges, tomatoes, asparagus, and potatoes.  Orange and tomato juices.  Red meats.  Fruit yogurt. HOME CARE INSTRUCTIONS  Take all medicines as prescribed by your health care provider.  Maintain a healthy diet by including nutritious food, such as fruits, vegetables, nuts, whole grains, and lean meats.  If you are taking a laxative, be sure to follow the directions on  the label. SEEK MEDICAL CARE IF:  Your weakness gets worse.  You feel your heart pounding or racing.  You are vomiting or having diarrhea.  You are diabetic and having trouble keeping your blood glucose in the normal range. SEEK IMMEDIATE MEDICAL CARE IF:  You have chest pain, shortness of breath, or dizziness.  You are vomiting or having diarrhea for more than 2 days.  You faint. MAKE SURE YOU:   Understand these instructions.  Will watch your condition.  Will get help right away if you are not doing well or get worse.   This information is not intended to replace advice given to you by your health care provider. Make sure you discuss any questions you have with your health care provider.   Document Released: 02/16/2005 Document Revised: 03/09/2014 Document Reviewed: 08/19/2012 Elsevier Interactive Patient Education 2016 [place].  Potassium Content of Foods Potassium is a mineral found in many foods and drinks. It helps keep fluids and minerals balanced in your body and affects how steadily your heart beats. Potassium also helps control your blood pressure and keep your muscles and nervous system healthy. Certain health conditions and medicines may change the balance of potassium in your body. When this happens, you can help balance your level of potassium through the foods that you do or do not eat. Your health care provider or dietitian may recommend an amount of potassium that you should have each day. The following lists of foods provide the amount of potassium (in parentheses) per serving in each item. HIGH IN POTASSIUM  The following foods and beverages have 200 mg or more of potassium per serving:  Apricots, 2 raw or 5 dry (200 mg).  Artichoke, 1 medium (345 mg).  Avocado, raw,  each (245 mg).  Banana, 1 medium (425 mg).  Beans, lima, or baked beans, canned,  cup (280 mg).  Beans, white, canned,  cup (595 mg).  Beef roast, 3 oz (320 mg).  Beef,  ground, 3 oz (270 mg).  Beets, raw or cooked,  cup (260 mg).  Bran muffin, 2 oz (300 mg).  Broccoli,  cup (230 mg).  Brussels sprouts,  cup (250 mg).  Cantaloupe,  cup (215 mg).  Cereal, 100% bran,  cup (200-400 mg).  Cheeseburger, single, fast food, 1 each (225-400 mg).  Chicken, 3 oz (220 mg).  Clams, canned, 3 oz (535 mg).  Crab, 3 oz (225 mg).  Dates, 5 each (270 mg).  Dried beans and peas,  cup (300-475 mg).  Figs, dried, 2 each (260 mg).  Fish: halibut, tuna, cod, snapper, 3 oz (480 mg).  Fish: salmon, haddock, swordfish, perch, 3 oz (300 mg).  Fish, tuna, canned 3 oz (200 mg).  [place] fries, fast food, 3 oz (470 mg).  Granola with fruit and nuts,  cup (200 mg).  Grapefruit juice,  cup (200 mg).  Greens, beet,  cup (655 mg).  Honeydew melon,  cup (200 mg).  Kale, raw, 1 cup (300 mg).  Kiwi, 1 medium (240 mg).  Kohlrabi, rutabaga, parsnips,  cup (280 mg).  Lentils,  cup (365 mg).  Mango, 1 each (325 mg).  Milk, chocolate, 1 cup (420 mg).  Milk: nonfat, low-fat, whole, buttermilk, 1 cup (350-380 mg).  Molasses, 1 Tbsp (295 mg).  Mushrooms,  cup (280) mg.  Nectarine, 1 each (275 mg).  Nuts: almonds, peanuts, hazelnuts, [place], cashew, mixed, 1 oz (200 mg).  Nuts, pistachios, 1 oz (295 mg).  Orange, 1 each (240 mg).  Orange juice,  cup (235 mg).  Papaya, medium,  fruit (390 mg).  Peanut butter, chunky, 2 Tbsp (240 mg).  Peanut butter, smooth, 2 Tbsp (210 mg).  Pear, 1 medium (200 mg).  Pomegranate, 1 whole (400 mg).  Pomegranate juice,  cup (215 mg).  Pork, 3 oz (350 mg).  Potato chips, salted, 1 oz (465 mg).  Potato, baked with skin, 1 medium (925 mg).  Potatoes, boiled,  cup (255 mg).  Potatoes, mashed,  cup (330 mg).  Prune juice,  cup (370 mg).  Prunes, 5 each (305 mg).  Pudding, chocolate,  cup (230 mg).  Pumpkin, canned,  cup (250 mg).  Raisins, seedless,  cup (270 mg).  Seeds,  sunflower or pumpkin, 1 oz (240 mg).  Soy milk, 1 cup (300 mg).  Spinach,  cup (420 mg).  Spinach, canned,  cup (370 mg).  Sweet potato, baked with skin, 1 medium (450 mg).  Swiss chard,  cup (480 mg).  Tomato or vegetable juice,  cup (275 mg).  Tomato sauce or puree,  cup (400-550 mg).  Tomato, raw, 1 medium (290 mg).  Tomatoes, canned,  cup (200-300 mg).  [place], 3 oz (250 mg).  Wheat germ, 1 oz (250 mg).  Winter squash,  cup (250 mg).  Yogurt, plain or fruited, 6 oz (260-435 mg).  Zucchini,  cup (220 mg). MODERATE IN POTASSIUM The following foods and beverages have 50-200 mg of potassium per serving:  Apple, 1 each (150 mg).  Apple juice,  cup (150 mg).  Applesauce,  cup (90 mg).  Apricot nectar,  cup (140 mg).  Asparagus, small spears,  cup or 6 spears (155 mg).  Bagel, cinnamon raisin, 1 each (130 mg).  Bagel, egg or plain, 4 in., 1 each (70 mg).  Beans, green,  cup (90 mg).  Beans, yellow,  cup (190 mg).  Beer, regular, 12 oz (100 mg).  Beets, canned,  cup (125 mg).  Blackberries,  cup (115 mg).  Blueberries,  cup (60 mg).  Bread, whole wheat, 1 slice (70 mg).  Broccoli, raw,  cup (145 mg).  Cabbage,  cup (150 mg).  Carrots, cooked or raw,  cup (180 mg).  Cauliflower, raw,  cup (150 mg).  Celery, raw,  cup (155 mg).  Cereal, bran flakes, cup (120-150 mg).  Cheese, cottage,  cup (110 mg).  Cherries, 10 each (150 mg).  Chocolate, 1 oz bar (165 mg).  Coffee, brewed 6 oz (90 mg).  Corn,  cup or 1 ear (195 mg).  Cucumbers,  cup (80 mg).  Egg, large, 1 each (60 mg).  Eggplant,  cup (60 mg).  Endive, raw, cup (80 mg).  English muffin, 1 each (65 mg).  Fish, orange roughy, 3 oz (150 mg).  Frankfurter, beef or pork, 1 each (75 mg).  Fruit cocktail,  cup (115 mg).  Grape juice,  cup (170  mg).  Grapefruit,  fruit (175 mg).  Grapes,  cup (155 mg).  Greens: kale, turnip, collard,  cup  (110-150 mg).  Ice cream or frozen yogurt, chocolate,  cup (175 mg).  Ice cream or frozen yogurt, vanilla,  cup (120-150 mg).  Lemons, limes, 1 each (80 mg).  Lettuce, all types, 1 cup (100 mg).  Mixed vegetables,  cup (150 mg).  Mushrooms, raw,  cup (110 mg).  Nuts: walnuts, pecans, or macadamia, 1 oz (125 mg).  Oatmeal,  cup (80 mg).  Okra,  cup (110 mg).  Onions, raw,  cup (120 mg).  Peach, 1 each (185 mg).  Peaches, canned,  cup (120 mg).  Pears, canned,  cup (120 mg).  Peas, green, frozen,  cup (90 mg).  Peppers, green,  cup (130 mg).  Peppers, red,  cup (160 mg).  Pineapple juice,  cup (165 mg).  Pineapple, fresh or canned,  cup (100 mg).  Plums, 1 each (105 mg).  Pudding, vanilla,  cup (150 mg).  Raspberries,  cup (90 mg).  Rhubarb,  cup (115 mg).  Rice, wild,  cup (80 mg).  Shrimp, 3 oz (155 mg).  Spinach, raw, 1 cup (170 mg).  Strawberries,  cup (125 mg).  Summer squash  cup (175-200 mg).  Swiss chard, raw, 1 cup (135 mg).  Tangerines, 1 each (140 mg).  Tea, brewed, 6 oz (65 mg).  Turnips,  cup (140 mg).  Watermelon,  cup (85 mg).  Wine, red, table, 5 oz (180 mg).  Wine, white, table, 5 oz (100 mg). LOW IN POTASSIUM The following foods and beverages have less than 50 mg of potassium per serving.  Bread, white, 1 slice (30 mg).  Carbonated beverages, 12 oz (less than 5 mg).  Cheese, 1 oz (20-30 mg).  Cranberries,  cup (45 mg).  Cranberry juice cocktail,  cup (20 mg).  Fats and oils, 1 Tbsp (less than 5 mg).  Hummus, 1 Tbsp (32 mg).  Nectar: papaya, mango, or pear,  cup (35 mg).  Rice, white or brown,  cup (50 mg).  Spaghetti or macaroni,  cup cooked (30 mg).  Tortilla, flour or corn, 1 each (50 mg).  Waffle, 4 in., 1 each (50 mg).  Water chestnuts,  cup (40 mg).   This information is not intended to replace advice given to you by your health care provider. Make sure you discuss any  questions you have with your health care provider.   Document Released: 09/30/2004 Document Revised: 02/21/2013 Document Reviewed: 01/13/2013 Elsevier Interactive Patient Education [employer].

## 2015-05-21 NOTE — ED Notes (Signed)
Pt c/o diarrhea with cough congestion since Friday, states he has been taking OTC meds that are not helping.

## 2015-05-21 NOTE — ED Provider Notes (Signed)
[facility] Emergency Department Provider Note  ____________________________________________  Time seen: Approximately 11:50 AM  I have reviewed the triage vital signs and the nursing notes.   HISTORY  Chief Complaint Diarrhea    HPI ARTHAS VASIL is a 50 y.o. male with history that includes Fabry disease, chronic kidney disease, chronically elevated troponinwho is seen both in the [facility] system and in the [facility] care system for his chronic conditions who presents today with complaint of profuse diarrhea for 5 days.  He reports that the symptoms are severe and began acutely 5 days ago.  He reports 5 or 6 episodes of diarrhea each day and is particularly notable when he takes anything by mouth to eat or drink.  He describes the diarrhea as watery and he has not seen any blood in it.  He denies any nausea, vomiting, and abdominal pain, including no cramping.  He denies any recent travel or questionable water exposures.  He does note that the symptoms began less than a day after eating some chicken at a fast food restaurant and he wonders if that might have some effect.  He has no recent ill contacts with the same symptoms.  He has been trying Lomotil and Pepto-Bismol but they have not helped.  He denies fever/chills, chest pain, shortness of breath.  He also complains of a mild nonproductive cough for several days which he "would like to get checked out".   Past Medical History  Diagnosis Date  . Fabry disease ([place])     a. initially diagnosed in [place], [place] in ~ 2003 to 2004, previously treated with Fabrazyme  . Hypertension   . Mixed hyperlipidemia   . CKD (chronic kidney disease), stage II     a. 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 LVH (left ventricular hypertrophy)   . Vitamin D deficiency   . Fabry disease [facility])     Patient Active Problem List   Diagnosis Date Noted  . Chest pain 01/12/2015  . Elevated troponin 01/12/2015  . HTN (hypertension)  01/12/2015  . Fabry disease [facility])     Past Surgical History  Procedure Laterality Date  . No past surgeries    . Knee surgery Left     20+ years ago, 6+ years ago    Current Outpatient Rx  Name  Route  Sig  Dispense  Refill  . albuterol (PROVENTIL HFA;VENTOLIN HFA) 108 (90 BASE) MCG/ACT inhaler   Inhalation   Inhale 2 puffs into the lungs every 6 (six) hours as needed for wheezing or shortness of breath. Patient not taking: Reported on 05/15/2015   1 Inhaler   2   . aspirin 81 MG tablet   Oral   Take 81 mg by mouth daily.         . ciprofloxacin (CIPRO) 500 MG tablet   Oral   Take 1 tablet (500 mg total) by mouth 2 (two) times daily.   6 tablet   0   . cyanocobalamin 1000 MCG tablet   Oral   Take 100 mcg by mouth daily.         . diphenoxylate-atropine (LOMOTIL) 2.5-0.025 MG per tablet   Oral   Take 2 tablets by mouth 4 (four) times daily as needed.          . fexofenadine-pseudoephedrine (ALLEGRA-D) 60-120 MG per tablet   Oral   Take 1 tablet by mouth 2 (two) times daily.         . furosemide (  LASIX) 40 MG tablet   Oral   Take 40 mg by mouth daily.         Marland Kitchen guaiFENesin-codeine 100-10 MG/5ML syrup   Oral   Take 10 mLs by mouth 3 (three) times daily as needed. Patient not taking: Reported on 05/15/2015   120 mL   0   . EXPIRED: loratadine (CLARITIN) 10 MG tablet   Oral   Take 10 mg by mouth daily as needed.          . Multiple Vitamin (MULTIVITAMIN) tablet   Oral   Take 1 tablet by mouth daily.         . potassium chloride SA (K-DUR,KLOR-CON) 20 MEQ tablet   Oral   Take 20 mEq by mouth 2 (two) times daily.         . valsartan (DIOVAN) 160 MG tablet   Oral   Take 160 mg by mouth daily. Pt takes 1 in the morning and 1/2 tablet every evening.           Allergies Penicillins  Family History  Problem Relation Age of Onset  . Stroke Mother   . CAD Mother   . CAD Father   . Skin cancer Father     Social History Social History   Substance Use Topics  . Smoking status: Never Smoker   . Smokeless tobacco: None  . Alcohol Use: No    Review of Systems Constitutional: No fever/chills Eyes: No visual changes. ENT: No sore throat. Cardiovascular: Denies chest pain. Respiratory: Denies shortness of breath. Gastrointestinal: No abdominal pain.  No nausea, no vomiting.  Profuse watery diarrhea Genitourinary: Negative for dysuria. Musculoskeletal: Negative for back pain. Skin: Negative for rash. Neurological: Negative for headaches, focal weakness or numbness.  10-point ROS otherwise negative.  ____________________________________________   PHYSICAL EXAM:  VITAL SIGNS: ED Triage Vitals  Enc Vitals Group     BP 05/21/15 0823 145/91 mmHg     Pulse Rate 05/21/15 0823 85     Resp 05/21/15 0823 18     Temp 05/21/15 0823 97.5 F (36.4 C)     Temp Source 05/21/15 0823 Oral     SpO2 05/21/15 0823 97 %     Weight 05/21/15 0823 200 lb (90.719 kg)     Height 05/21/15 0823 5\' 9"  (1.753 m)     Head Cir --      Peak Flow --      Pain Score 05/21/15 0824 0     Pain Loc --      Pain Edu? --      Excl. in [place]? --     Constitutional: Alert and oriented. Well appearing and in no acute distress. Eyes: Conjunctivae are normal. PERRL. EOMI. Head: Atraumatic. Nose: No congestion/rhinnorhea. Mouth/Throat: Mucous membranes are moist.  Oropharynx non-erythematous. Neck: No stridor.  No meningeal signs.   Cardiovascular: Normal rate, regular rhythm. Good peripheral circulation. Grossly normal heart sounds.   Respiratory: Normal respiratory effort.  No retractions. Lungs CTAB. Gastrointestinal: Soft and nontender. No distention.  Musculoskeletal: No lower extremity tenderness nor edema. No gross deformities of extremities. Neurologic:  Normal speech and language. No gross focal neurologic deficits are appreciated.  Skin:  Skin is warm, dry and intact. No rash noted. Psychiatric: Mood and affect are normal. Speech and  behavior are normal.  ____________________________________________   LABS (all labs ordered are listed, but only abnormal results are displayed)  Labs Reviewed  COMPREHENSIVE METABOLIC PANEL - Abnormal; Notable for the following:  Sodium 133 (*)    Potassium 3.1 (*)    CO2 17 (*)    Creatinine, Ser 1.62 (*)    AST 55 (*)    ALT 86 (*)    GFR calc non Af Amer 48 (*)    GFR calc Af Amer 55 (*)    All other components within normal limits  LIPASE, BLOOD  CBC   ____________________________________________  EKG  None ____________________________________________  RADIOLOGY   No results found.  ____________________________________________   PROCEDURES  Procedure(s) performed: None  Critical Care performed: No ____________________________________________   INITIAL IMPRESSION / ASSESSMENT AND PLAN / ED COURSE  Pertinent labs & imaging results that were available during my care of the patient were reviewed by me and considered in my medical decision making (see chart for details).  The patient has no specific exposures but has been suffering from profuse diarrhea for 5 days.  We had a lengthy discussion about how this is most likely a viral infection but could be bacterial.  Given that he has chronic hypokalemia for which he takes potassium chloride supplements but is more hypokalemic and usual, I think it is reasonable to try a short 3 day course of Cipro to see if this helps alleviate his symptoms.  Additionally he has not tried taking loperamide and I have recommended to him that he do so according to the label instructions.  He has no suggestion of enterohemorrhagic infection in his history and I think that loperamide and Cipro would be potentially effective for him.  I urged him to follow up as soon as possible with his nephrologist; although his GFR is essentially unchanged from prior it is important that he be watched closely given his increased gastrointestinal  losses.  Calculated his creatinine clearance both by MDRD and Cockcroft-Gault, and there is no decreased dosing of Cipro recommended for his level of kidney function (greater than 50 mL/min with both calculations).    I gave my usual and customary return precautions.     ____________________________________________  FINAL CLINICAL IMPRESSION(S) / ED DIAGNOSES  Final diagnoses:  Diarrhea, unspecified type  Hypokalemia      NEW MEDICATIONS STARTED DURING THIS VISIT:  New Prescriptions   CIPROFLOXACIN (CIPRO) 500 MG TABLET    Take 1 tablet (500 mg total) by mouth 2 (two) times daily.      Note:  This document was prepared using Dragon voice recognition software and may include unintentional dictation errors.   Hinda Kehr, MD 05/21/15 [phone]

## 2015-05-22 ENCOUNTER — Inpatient Hospital Stay: Payer: Medicare Other

## 2015-05-29 ENCOUNTER — Inpatient Hospital Stay: Payer: Medicare Other

## 2015-05-29 VITALS — BP 114/72 | HR 60 | Temp 97.0°F | Resp 18 | Wt 188.7 lb

## 2015-05-29 DIAGNOSIS — R05 Cough: Secondary | ICD-10-CM | POA: Diagnosis not present

## 2015-05-29 DIAGNOSIS — E7521 Fabry (-Anderson) disease: Secondary | ICD-10-CM | POA: Diagnosis not present

## 2015-05-29 DIAGNOSIS — R0982 Postnasal drip: Secondary | ICD-10-CM | POA: Diagnosis not present

## 2015-05-29 DIAGNOSIS — Z79899 Other long term (current) drug therapy: Secondary | ICD-10-CM | POA: Diagnosis not present

## 2015-05-29 MED ORDER — SODIUM CHLORIDE 0.9 % IV SOLN
80.0000 mg | Freq: Once | INTRAVENOUS | Status: AC
  Administered 2015-05-29: 80 mg via INTRAVENOUS
  Filled 2015-05-29: qty 16

## 2015-05-29 MED ORDER — SODIUM CHLORIDE 0.9 % IV SOLN
80.0000 mg | Freq: Once | INTRAVENOUS | Status: DC
  Filled 2015-05-29: qty 16

## 2015-05-29 MED ORDER — SODIUM CHLORIDE 0.9 % IV SOLN
Freq: Once | INTRAVENOUS | Status: AC
  Administered 2015-05-29: 10:00:00 via INTRAVENOUS
  Filled 2015-05-29: qty 1000

## 2015-05-29 MED ORDER — ACETAMINOPHEN 500 MG PO TABS: 1000.0000 mg | ORAL_TABLET | Freq: Once | ORAL | Status: DC

## 2015-06-05 ENCOUNTER — Inpatient Hospital Stay: Payer: Medicare Other

## 2015-06-12 ENCOUNTER — Inpatient Hospital Stay: Payer: Medicare Other | Attending: Oncology

## 2015-06-12 VITALS — BP 118/68 | HR 70 | Temp 97.0°F | Resp 18

## 2015-06-12 DIAGNOSIS — E7521 Fabry (-Anderson) disease: Secondary | ICD-10-CM | POA: Insufficient documentation

## 2015-06-12 DIAGNOSIS — Z79899 Other long term (current) drug therapy: Secondary | ICD-10-CM | POA: Diagnosis not present

## 2015-06-12 MED ORDER — SODIUM CHLORIDE 0.9 % IV SOLN
80.0000 mg | Freq: Once | INTRAVENOUS | Status: AC
  Administered 2015-06-12: 80 mg via INTRAVENOUS
  Filled 2015-06-12: qty 16

## 2015-06-12 MED ORDER — ACETAMINOPHEN 500 MG PO TABS
1000.0000 mg | ORAL_TABLET | Freq: Once | ORAL | Status: DC
  Filled 2015-06-12: qty 2

## 2015-06-12 MED ORDER — SODIUM CHLORIDE 0.9 % IV SOLN
Freq: Once | INTRAVENOUS | Status: AC
  Administered 2015-06-12: 10:00:00 via INTRAVENOUS
  Filled 2015-06-12: qty 1000

## 2015-06-19 ENCOUNTER — Inpatient Hospital Stay: Payer: Medicare Other

## 2015-06-26 ENCOUNTER — Inpatient Hospital Stay: Payer: Medicare Other

## 2015-06-26 VITALS — BP 112/70 | HR 58 | Temp 97.4°F | Resp 20 | Wt 188.4 lb

## 2015-06-26 DIAGNOSIS — Z79899 Other long term (current) drug therapy: Secondary | ICD-10-CM | POA: Diagnosis not present

## 2015-06-26 DIAGNOSIS — E7521 Fabry (-Anderson) disease: Secondary | ICD-10-CM

## 2015-06-26 MED ORDER — SODIUM CHLORIDE 0.9 % IV SOLN
Freq: Once | INTRAVENOUS | Status: AC
  Administered 2015-06-26: 09:00:00 via INTRAVENOUS
  Filled 2015-06-26: qty 1000

## 2015-06-26 MED ORDER — ACETAMINOPHEN 500 MG PO TABS: 1000.0000 mg | ORAL_TABLET | Freq: Once | ORAL | Status: DC

## 2015-06-26 MED ORDER — SODIUM CHLORIDE 0.9 % IV SOLN
80.0000 mg | Freq: Once | INTRAVENOUS | Status: AC
  Administered 2015-06-26: 80 mg via INTRAVENOUS
  Filled 2015-06-26: qty 16

## 2015-07-03 ENCOUNTER — Inpatient Hospital Stay: Payer: Medicare Other

## 2015-07-10 ENCOUNTER — Inpatient Hospital Stay: Payer: Medicare Other

## 2015-07-12 ENCOUNTER — Inpatient Hospital Stay: Payer: Medicare Other | Attending: Oncology

## 2015-07-12 VITALS — BP 149/82 | HR 76 | Temp 97.8°F | Resp 20 | Wt 193.5 lb

## 2015-07-12 DIAGNOSIS — N182 Chronic kidney disease, stage 2 (mild): Secondary | ICD-10-CM | POA: Insufficient documentation

## 2015-07-12 DIAGNOSIS — R531 Weakness: Secondary | ICD-10-CM | POA: Insufficient documentation

## 2015-07-12 DIAGNOSIS — E7521 Fabry (-Anderson) disease: Secondary | ICD-10-CM

## 2015-07-12 DIAGNOSIS — E782 Mixed hyperlipidemia: Secondary | ICD-10-CM | POA: Insufficient documentation

## 2015-07-12 DIAGNOSIS — I517 Cardiomegaly: Secondary | ICD-10-CM | POA: Insufficient documentation

## 2015-07-12 DIAGNOSIS — R5383 Other fatigue: Secondary | ICD-10-CM | POA: Insufficient documentation

## 2015-07-12 DIAGNOSIS — Z7982 Long term (current) use of aspirin: Secondary | ICD-10-CM | POA: Diagnosis not present

## 2015-07-12 DIAGNOSIS — Z79899 Other long term (current) drug therapy: Secondary | ICD-10-CM | POA: Insufficient documentation

## 2015-07-12 DIAGNOSIS — R5381 Other malaise: Secondary | ICD-10-CM | POA: Diagnosis not present

## 2015-07-12 DIAGNOSIS — E559 Vitamin D deficiency, unspecified: Secondary | ICD-10-CM | POA: Insufficient documentation

## 2015-07-12 DIAGNOSIS — I129 Hypertensive chronic kidney disease with stage 1 through stage 4 chronic kidney disease, or unspecified chronic kidney disease: Secondary | ICD-10-CM | POA: Diagnosis not present

## 2015-07-12 MED ORDER — SODIUM CHLORIDE 0.9 % IV SOLN
80.0000 mg | Freq: Once | INTRAVENOUS | Status: AC
  Administered 2015-07-12: 80 mg via INTRAVENOUS
  Filled 2015-07-12: qty 16

## 2015-07-12 MED ORDER — ACETAMINOPHEN 500 MG PO TABS
1000.0000 mg | ORAL_TABLET | Freq: Once | ORAL | Status: AC
  Administered 2015-07-12: 1000 mg via ORAL
  Filled 2015-07-12: qty 2

## 2015-07-12 MED ORDER — SODIUM CHLORIDE 0.9 % IV SOLN
Freq: Once | INTRAVENOUS | Status: AC
  Administered 2015-07-12: 10:00:00 via INTRAVENOUS
  Filled 2015-07-12: qty 1000

## 2015-07-17 ENCOUNTER — Inpatient Hospital Stay: Payer: Medicare Other

## 2015-07-19 ENCOUNTER — Ambulatory Visit: Payer: Medicare Other | Admitting: Oncology

## 2015-07-19 ENCOUNTER — Ambulatory Visit: Payer: Medicare Other

## 2015-07-24 ENCOUNTER — Inpatient Hospital Stay: Payer: Medicare Other

## 2015-07-26 ENCOUNTER — Inpatient Hospital Stay: Payer: Medicare Other

## 2015-07-26 ENCOUNTER — Inpatient Hospital Stay (HOSPITAL_BASED_OUTPATIENT_CLINIC_OR_DEPARTMENT_OTHER): Payer: Medicare Other | Admitting: Oncology

## 2015-07-26 VITALS — BP 110/72 | HR 76 | Temp 98.5°F | Wt 191.8 lb

## 2015-07-26 DIAGNOSIS — R531 Weakness: Secondary | ICD-10-CM | POA: Diagnosis not present

## 2015-07-26 DIAGNOSIS — I129 Hypertensive chronic kidney disease with stage 1 through stage 4 chronic kidney disease, or unspecified chronic kidney disease: Secondary | ICD-10-CM

## 2015-07-26 DIAGNOSIS — N182 Chronic kidney disease, stage 2 (mild): Secondary | ICD-10-CM | POA: Diagnosis not present

## 2015-07-26 DIAGNOSIS — R5383 Other fatigue: Secondary | ICD-10-CM | POA: Diagnosis not present

## 2015-07-26 DIAGNOSIS — Z7982 Long term (current) use of aspirin: Secondary | ICD-10-CM

## 2015-07-26 DIAGNOSIS — Z79899 Other long term (current) drug therapy: Secondary | ICD-10-CM

## 2015-07-26 DIAGNOSIS — E782 Mixed hyperlipidemia: Secondary | ICD-10-CM

## 2015-07-26 DIAGNOSIS — E7521 Fabry (-Anderson) disease: Secondary | ICD-10-CM

## 2015-07-26 DIAGNOSIS — R5381 Other malaise: Secondary | ICD-10-CM

## 2015-07-26 DIAGNOSIS — I517 Cardiomegaly: Secondary | ICD-10-CM

## 2015-07-26 DIAGNOSIS — E559 Vitamin D deficiency, unspecified: Secondary | ICD-10-CM

## 2015-07-26 MED ORDER — SODIUM CHLORIDE 0.9 % IV SOLN
Freq: Once | INTRAVENOUS | Status: AC
  Administered 2015-07-26: 10:00:00 via INTRAVENOUS
  Filled 2015-07-26: qty 1000

## 2015-07-26 MED ORDER — SODIUM CHLORIDE 0.9 % IV SOLN
80.0000 mg | Freq: Once | INTRAVENOUS | Status: AC
  Administered 2015-07-26: 80 mg via INTRAVENOUS
  Filled 2015-07-26: qty 16

## 2015-07-26 MED ORDER — ACETAMINOPHEN 500 MG PO TABS
1000.0000 mg | ORAL_TABLET | Freq: Once | ORAL | Status: AC
  Administered 2015-07-26: 1000 mg via ORAL

## 2015-07-26 NOTE — Progress Notes (Signed)
[place]  [phone] Fax:(336) [phone]  ID: Jeffery Hughes OB: 14-Jun-1965  MR#: [id number]  [id number]  Patient Care Team: [facility] as PCP - General (Family Medicine)  CHIEF COMPLAINT: Fabry disease requiring Fabrazyme infusion.  INTERVAL HISTORY: Patient returns to clinic today for further evaluation and continuation of Fabrazyme infusion every 2 weeks. He does notice increased weakness and fatigue 1-2 days prior to his infusion, but otherwise feels well and is asymptomatic.  He has no neurologic complaints.  He denies any recent fevers or illnesses.  He has a good appetite and denies weight loss.  He denies any pain.  He has no chest pain or shortness of breath.  He denies any nausea, vomiting, constipation, or diarrhea.  He has no urinary complaints.  Patient feels at his baseline and offers no specific complaints today.   REVIEW OF SYSTEMS:   Review of Systems  Constitutional: Positive for malaise/fatigue. Negative for fever and weight loss.  Respiratory: Negative.  Negative for shortness of breath.   Cardiovascular: Negative.  Negative for chest pain.  Gastrointestinal: Negative.   Genitourinary: Negative.   Musculoskeletal: Negative.   Neurological: Positive for weakness.  Psychiatric/Behavioral: Negative.     As per HPI. Otherwise, a complete review of systems is negatve.  PAST MEDICAL HISTORY: Past Medical History  Diagnosis Date  . Fabry disease ([place])     a. initially diagnosed in [place], [place] in ~ 2003 to 2004, previously treated with Fabrazyme  . Hypertension   . Mixed hyperlipidemia   . CKD (chronic kidney disease), stage II     a. 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 LVH (left ventricular hypertrophy)   . Vitamin D deficiency   . Fabry disease ([place])     PAST SURGICAL HISTORY: Past Surgical History  Procedure Laterality Date  . No past surgeries    . Knee surgery Left     20+ years ago, 6+ years ago     FAMILY HISTORY: Brother with Fabry's disease, otherwise negative.     ADVANCED DIRECTIVES:    HEALTH MAINTENANCE: Social History  Substance Use Topics  . Smoking status: Never Smoker   . Smokeless tobacco: Not on file  . Alcohol Use: No     Colonoscopy:  PAP:  Bone density:  Lipid panel:  Allergies  Allergen Reactions  . Penicillins Other (See Comments)    unknown    Current Outpatient Prescriptions  Medication Sig Dispense Refill  . albuterol (PROVENTIL HFA;VENTOLIN HFA) 108 (90 BASE) MCG/ACT inhaler Inhale 2 puffs into the lungs every 6 (six) hours as needed for wheezing or shortness of breath. 1 Inhaler 2  . aspirin 81 MG tablet Take 81 mg by mouth daily.    . cyanocobalamin 1000 MCG tablet Take 100 mcg by mouth daily.    . diphenoxylate-atropine (LOMOTIL) 2.5-0.025 MG per tablet Take 2 tablets by mouth 4 (four) times daily as needed.     . furosemide (LASIX) 40 MG tablet Take 40 mg by mouth daily.    Marland Kitchen guaiFENesin-codeine 100-10 MG/5ML syrup Take 10 mLs by mouth 3 (three) times daily as needed. 120 mL 0  . loratadine (CLARITIN) 10 MG tablet Take 10 mg by mouth daily as needed.     . Multiple Vitamin (MULTIVITAMIN) tablet Take 1 tablet by mouth daily.    . potassium chloride SA (K-DUR,KLOR-CON) 20 MEQ tablet Take 20 mEq by mouth 2 (two) times daily.    . valsartan (DIOVAN) 160 MG tablet Take  160 mg by mouth daily. Pt takes 1 in the morning and 1/2 tablet every evening.     No current facility-administered medications for this visit.   Facility-Administered Medications Ordered in Other Visits  Medication Dose Route Frequency Provider Last Rate Last Dose  . 0.9 %  sodium chloride infusion   Intravenous Once Lloyd Huger, MD      . acetaminophen (TYLENOL) tablet 1,000 mg  1,000 mg Oral Once Lloyd Huger, MD   1,000 mg at 07/18/14 1021  . acetaminophen (TYLENOL) tablet 1,000 mg  1,000 mg Oral Once Lloyd Huger, MD      . agalsidase beta (FABRAZYME)  80 mg in sodium chloride 0.9 % 500 mL IVPB  80 mg Intravenous Once Lloyd Huger, MD        OBJECTIVE: There were no vitals filed for this visit.   There is no weight on file to calculate BMI.    ECOG FS:0 - Asymptomatic  General: Well-developed, well-nourished, no acute distress. Eyes: Pink conjunctiva, anicteric sclera. Lungs: Clear to auscultation bilaterally. Heart: Regular rate and rhythm. No rubs, murmurs, or gallops. Abdomen: Soft, nontender, nondistended. No organomegaly noted, normoactive bowel sounds. Musculoskeletal: No edema, cyanosis, or clubbing. Neuro: Alert, answering all questions appropriately. Cranial nerves grossly intact. Skin: No rashes or petechiae noted. Psych: Normal affect.   LAB RESULTS:  Lab Results  Component Value Date   NA 133* 05/21/2015   K 3.1* 05/21/2015   CL 109 05/21/2015   CO2 17* 05/21/2015   GLUCOSE 96 05/21/2015   BUN 16 05/21/2015   CREATININE 1.62* 05/21/2015   CALCIUM 9.0 05/21/2015   PROT 7.7 05/21/2015   ALBUMIN 3.7 05/21/2015   AST 55* 05/21/2015   ALT 86* 05/21/2015   ALKPHOS 62 05/21/2015   BILITOT 0.8 05/21/2015   GFRNONAA 48* 05/21/2015   GFRAA 55* 05/21/2015    Lab Results  Component Value Date   WBC 7.0 05/21/2015   HGB 14.7 05/21/2015   HCT 43.4 05/21/2015   MCV 85.9 05/21/2015   PLT 370 05/21/2015     STUDIES: No results found.  ASSESSMENT: Fabry disease requiring Fabrazyme infusion.  PLAN:    1. Fabry disease requiring Fabrazyme infusion: Patient will require 1 mg/kg of Fabrazyme every 2 weeks indefinitely.  He will receive 1000 mg Tylenol and 25 mg Benadryl approximately 30 minutes prior to each infusion. He expressed understanding that we can only provide his infusion.  Any laboratory work or questions regarding his disease or infusions must be directed towards to his primary geneticist.  Return to clinic every 2 weeks for his infusion and then in 6 months for routine follow-up.   Patient  expressed understanding and was in agreement with this plan. He also understands that He can call clinic at any time with any questions, concerns, or complaints.   PCP: Dr. Ernest Pine at [facility] Geneticist:  Dr. Sherryle Lis at [place], [place]   07/26/2015 10:31 AM

## 2015-07-31 ENCOUNTER — Inpatient Hospital Stay: Payer: Medicare Other

## 2015-08-07 ENCOUNTER — Inpatient Hospital Stay: Payer: Medicare Other

## 2015-08-14 ENCOUNTER — Inpatient Hospital Stay: Payer: Medicare Other | Attending: Oncology

## 2015-08-14 ENCOUNTER — Inpatient Hospital Stay: Payer: Medicare Other

## 2015-08-14 VITALS — BP 134/77 | HR 73 | Temp 98.3°F | Wt 192.0 lb

## 2015-08-14 DIAGNOSIS — E7521 Fabry (-Anderson) disease: Secondary | ICD-10-CM

## 2015-08-14 DIAGNOSIS — Z79899 Other long term (current) drug therapy: Secondary | ICD-10-CM | POA: Insufficient documentation

## 2015-08-14 MED ORDER — SODIUM CHLORIDE 0.9 % IV SOLN
80.0000 mg | Freq: Once | INTRAVENOUS | Status: AC
  Administered 2015-08-14: 80 mg via INTRAVENOUS
  Filled 2015-08-14: qty 16

## 2015-08-14 MED ORDER — SODIUM CHLORIDE 0.9 % IV SOLN
Freq: Once | INTRAVENOUS | Status: AC
  Administered 2015-08-14: 10:00:00 via INTRAVENOUS
  Filled 2015-08-14: qty 1000

## 2015-08-14 MED ORDER — ACETAMINOPHEN 500 MG PO TABS: 1000.0000 mg | ORAL_TABLET | Freq: Once | ORAL | Status: DC

## 2015-08-21 ENCOUNTER — Inpatient Hospital Stay: Payer: Medicare Other

## 2015-08-28 ENCOUNTER — Inpatient Hospital Stay: Payer: Medicare Other

## 2015-08-28 VITALS — BP 133/84 | HR 62 | Temp 97.4°F

## 2015-08-28 DIAGNOSIS — E7521 Fabry (-Anderson) disease: Secondary | ICD-10-CM | POA: Diagnosis not present

## 2015-08-28 DIAGNOSIS — Z79899 Other long term (current) drug therapy: Secondary | ICD-10-CM | POA: Diagnosis not present

## 2015-08-28 MED ORDER — SODIUM CHLORIDE 0.9 % IV SOLN
Freq: Once | INTRAVENOUS | Status: AC
  Administered 2015-08-28: 10:00:00 via INTRAVENOUS
  Filled 2015-08-28: qty 1000

## 2015-08-28 MED ORDER — ACETAMINOPHEN 500 MG PO TABS: 1000.0000 mg | ORAL_TABLET | Freq: Once | ORAL | Status: DC

## 2015-08-28 MED ORDER — SODIUM CHLORIDE 0.9 % IV SOLN
80.0000 mg | Freq: Once | INTRAVENOUS | Status: AC
  Administered 2015-08-28: 80 mg via INTRAVENOUS
  Filled 2015-08-28: qty 16

## 2015-09-04 ENCOUNTER — Inpatient Hospital Stay: Payer: Medicare Other

## 2015-09-11 ENCOUNTER — Inpatient Hospital Stay: Payer: Medicare Other

## 2015-09-11 ENCOUNTER — Inpatient Hospital Stay: Payer: Medicare Other | Attending: Oncology

## 2015-09-11 VITALS — BP 120/75 | HR 68 | Temp 97.2°F | Resp 18 | Wt 199.3 lb

## 2015-09-11 DIAGNOSIS — Z79899 Other long term (current) drug therapy: Secondary | ICD-10-CM | POA: Diagnosis not present

## 2015-09-11 DIAGNOSIS — E7521 Fabry (-Anderson) disease: Secondary | ICD-10-CM | POA: Diagnosis not present

## 2015-09-11 MED ORDER — SODIUM CHLORIDE 0.9 % IV SOLN
80.0000 mg | Freq: Once | INTRAVENOUS | Status: AC
  Administered 2015-09-11: 80 mg via INTRAVENOUS
  Filled 2015-09-11: qty 16

## 2015-09-11 MED ORDER — SODIUM CHLORIDE 0.9 % IV SOLN
Freq: Once | INTRAVENOUS | Status: AC
  Administered 2015-09-11: 09:00:00 via INTRAVENOUS
  Filled 2015-09-11: qty 1000

## 2015-09-11 MED ORDER — ACETAMINOPHEN 500 MG PO TABS
1000.0000 mg | ORAL_TABLET | Freq: Once | ORAL | Status: AC
  Administered 2015-09-11: 1000 mg via ORAL
  Filled 2015-09-11: qty 2

## 2015-09-18 ENCOUNTER — Inpatient Hospital Stay: Payer: Medicare Other

## 2015-09-25 ENCOUNTER — Inpatient Hospital Stay: Payer: Medicare Other

## 2015-09-25 VITALS — BP 117/71 | HR 50 | Temp 96.9°F | Resp 18 | Wt 196.2 lb

## 2015-09-25 DIAGNOSIS — E7521 Fabry (-Anderson) disease: Secondary | ICD-10-CM

## 2015-09-25 DIAGNOSIS — Z79899 Other long term (current) drug therapy: Secondary | ICD-10-CM | POA: Diagnosis not present

## 2015-09-25 MED ORDER — ACETAMINOPHEN 500 MG PO TABS
1000.0000 mg | ORAL_TABLET | Freq: Once | ORAL | Status: AC
  Administered 2015-09-25: 1000 mg via ORAL
  Filled 2015-09-25: qty 2

## 2015-09-25 MED ORDER — SODIUM CHLORIDE 0.9 % IV SOLN
Freq: Once | INTRAVENOUS | Status: AC
  Administered 2015-09-25: 10:00:00 via INTRAVENOUS
  Filled 2015-09-25: qty 1000

## 2015-09-25 MED ORDER — SODIUM CHLORIDE 0.9 % IV SOLN
80.0000 mg | Freq: Once | INTRAVENOUS | Status: AC
  Administered 2015-09-25: 80 mg via INTRAVENOUS
  Filled 2015-09-25: qty 16

## 2015-10-02 ENCOUNTER — Inpatient Hospital Stay: Payer: Medicare Other

## 2015-10-09 ENCOUNTER — Inpatient Hospital Stay: Payer: Medicare Other

## 2015-10-09 ENCOUNTER — Inpatient Hospital Stay: Payer: Medicare Other | Attending: Oncology

## 2015-10-09 VITALS — BP 120/74 | HR 76 | Temp 97.6°F | Resp 18 | Wt 195.9 lb

## 2015-10-09 DIAGNOSIS — Z79899 Other long term (current) drug therapy: Secondary | ICD-10-CM | POA: Insufficient documentation

## 2015-10-09 DIAGNOSIS — E7521 Fabry (-Anderson) disease: Secondary | ICD-10-CM

## 2015-10-09 MED ORDER — SODIUM CHLORIDE 0.9 % IV SOLN
80.0000 mg | Freq: Once | INTRAVENOUS | Status: AC
  Administered 2015-10-09: 80 mg via INTRAVENOUS
  Filled 2015-10-09: qty 16

## 2015-10-09 MED ORDER — SODIUM CHLORIDE 0.9 % IV SOLN
Freq: Once | INTRAVENOUS | Status: AC
  Administered 2015-10-09: 10:00:00 via INTRAVENOUS
  Filled 2015-10-09: qty 1000

## 2015-10-09 MED ORDER — ACETAMINOPHEN 500 MG PO TABS
1000.0000 mg | ORAL_TABLET | Freq: Once | ORAL | Status: AC
  Administered 2015-10-09: 1000 mg via ORAL
  Filled 2015-10-09: qty 2

## 2015-10-16 DIAGNOSIS — I517 Cardiomegaly: Secondary | ICD-10-CM | POA: Diagnosis not present

## 2015-10-16 DIAGNOSIS — E7521 Fabry (-Anderson) disease: Secondary | ICD-10-CM | POA: Diagnosis not present

## 2015-10-16 DIAGNOSIS — L723 Sebaceous cyst: Secondary | ICD-10-CM | POA: Diagnosis not present

## 2015-10-23 ENCOUNTER — Inpatient Hospital Stay: Payer: Medicare Other

## 2015-10-23 VITALS — BP 118/74 | HR 54 | Temp 96.8°F | Resp 20 | Wt 196.9 lb

## 2015-10-23 DIAGNOSIS — E7521 Fabry (-Anderson) disease: Secondary | ICD-10-CM | POA: Diagnosis not present

## 2015-10-23 DIAGNOSIS — Z79899 Other long term (current) drug therapy: Secondary | ICD-10-CM | POA: Diagnosis not present

## 2015-10-23 MED ORDER — ACETAMINOPHEN 500 MG PO TABS
1000.0000 mg | ORAL_TABLET | Freq: Once | ORAL | Status: AC
  Administered 2015-10-23: 1000 mg via ORAL
  Filled 2015-10-23: qty 2

## 2015-10-23 MED ORDER — SODIUM CHLORIDE 0.9 % IV SOLN
80.0000 mg | Freq: Once | INTRAVENOUS | Status: AC
  Administered 2015-10-23: 80 mg via INTRAVENOUS
  Filled 2015-10-23: qty 16

## 2015-10-23 MED ORDER — SODIUM CHLORIDE 0.9 % IV SOLN
Freq: Once | INTRAVENOUS | Status: AC
  Administered 2015-10-23: 10:00:00 via INTRAVENOUS
  Filled 2015-10-23: qty 1000

## 2015-10-31 DIAGNOSIS — L728 Other follicular cysts of the skin and subcutaneous tissue: Secondary | ICD-10-CM | POA: Diagnosis not present

## 2015-10-31 DIAGNOSIS — L821 Other seborrheic keratosis: Secondary | ICD-10-CM | POA: Diagnosis not present

## 2015-11-06 ENCOUNTER — Inpatient Hospital Stay: Payer: Medicare Other | Attending: Oncology

## 2015-11-06 ENCOUNTER — Inpatient Hospital Stay: Payer: Medicare Other

## 2015-11-06 VITALS — BP 109/68 | HR 64 | Temp 97.0°F | Resp 20 | Wt 199.7 lb

## 2015-11-06 DIAGNOSIS — E7521 Fabry (-Anderson) disease: Secondary | ICD-10-CM | POA: Diagnosis not present

## 2015-11-06 DIAGNOSIS — Z79899 Other long term (current) drug therapy: Secondary | ICD-10-CM | POA: Diagnosis not present

## 2015-11-06 MED ORDER — SODIUM CHLORIDE 0.9 % IV SOLN
Freq: Once | INTRAVENOUS | Status: AC
  Administered 2015-11-06: 10:00:00 via INTRAVENOUS
  Filled 2015-11-06: qty 1000

## 2015-11-06 MED ORDER — ACETAMINOPHEN 500 MG PO TABS
1000.0000 mg | ORAL_TABLET | Freq: Once | ORAL | Status: AC
  Administered 2015-11-06: 1000 mg via ORAL
  Filled 2015-11-06: qty 2

## 2015-11-06 MED ORDER — SODIUM CHLORIDE 0.9 % IV SOLN
80.0000 mg | Freq: Once | INTRAVENOUS | Status: AC
  Administered 2015-11-06: 80 mg via INTRAVENOUS
  Filled 2015-11-06: qty 16

## 2015-11-20 ENCOUNTER — Inpatient Hospital Stay: Payer: Medicare Other

## 2015-11-20 VITALS — BP 119/72 | HR 58 | Temp 97.6°F | Resp 19 | Wt 198.2 lb

## 2015-11-20 DIAGNOSIS — E7521 Fabry (-Anderson) disease: Secondary | ICD-10-CM | POA: Diagnosis not present

## 2015-11-20 DIAGNOSIS — Z79899 Other long term (current) drug therapy: Secondary | ICD-10-CM | POA: Diagnosis not present

## 2015-11-20 MED ORDER — SODIUM CHLORIDE 0.9 % IV SOLN
Freq: Once | INTRAVENOUS | Status: AC
  Administered 2015-11-20: 10:00:00 via INTRAVENOUS
  Filled 2015-11-20: qty 1000

## 2015-11-20 MED ORDER — ACETAMINOPHEN 500 MG PO TABS
1000.0000 mg | ORAL_TABLET | Freq: Once | ORAL | Status: AC
  Administered 2015-11-20: 1000 mg via ORAL
  Filled 2015-11-20: qty 2

## 2015-11-20 MED ORDER — SODIUM CHLORIDE 0.9 % IV SOLN
80.0000 mg | Freq: Once | INTRAVENOUS | Status: AC
  Administered 2015-11-20: 80 mg via INTRAVENOUS
  Filled 2015-11-20: qty 16

## 2015-11-21 DIAGNOSIS — E7521 Fabry (-Anderson) disease: Secondary | ICD-10-CM | POA: Diagnosis not present

## 2015-11-25 DIAGNOSIS — L728 Other follicular cysts of the skin and subcutaneous tissue: Secondary | ICD-10-CM | POA: Diagnosis not present

## 2015-11-25 DIAGNOSIS — R208 Other disturbances of skin sensation: Secondary | ICD-10-CM | POA: Diagnosis not present

## 2015-11-25 DIAGNOSIS — R238 Other skin changes: Secondary | ICD-10-CM | POA: Diagnosis not present

## 2015-11-25 DIAGNOSIS — L720 Epidermal cyst: Secondary | ICD-10-CM | POA: Diagnosis not present

## 2015-11-25 DIAGNOSIS — L538 Other specified erythematous conditions: Secondary | ICD-10-CM | POA: Diagnosis not present

## 2015-12-04 ENCOUNTER — Inpatient Hospital Stay: Payer: Medicare Other | Attending: Oncology

## 2015-12-04 ENCOUNTER — Inpatient Hospital Stay: Payer: Medicare Other

## 2015-12-04 VITALS — BP 119/74 | HR 73 | Temp 98.5°F | Resp 18 | Wt 201.3 lb

## 2015-12-04 DIAGNOSIS — Z79899 Other long term (current) drug therapy: Secondary | ICD-10-CM | POA: Insufficient documentation

## 2015-12-04 DIAGNOSIS — E7521 Fabry (-Anderson) disease: Secondary | ICD-10-CM

## 2015-12-04 MED ORDER — SODIUM CHLORIDE 0.9 % IV SOLN
80.0000 mg | Freq: Once | INTRAVENOUS | Status: AC
  Administered 2015-12-04: 80 mg via INTRAVENOUS
  Filled 2015-12-04: qty 16

## 2015-12-04 MED ORDER — ACETAMINOPHEN 500 MG PO TABS
1000.0000 mg | ORAL_TABLET | Freq: Once | ORAL | Status: AC
  Administered 2015-12-04: 1000 mg via ORAL
  Filled 2015-12-04: qty 2

## 2015-12-04 MED ORDER — SODIUM CHLORIDE 0.9 % IV SOLN
Freq: Once | INTRAVENOUS | Status: AC
  Administered 2015-12-04: 10:00:00 via INTRAVENOUS
  Filled 2015-12-04: qty 1000

## 2015-12-17 DIAGNOSIS — Z23 Encounter for immunization: Secondary | ICD-10-CM | POA: Diagnosis not present

## 2015-12-17 DIAGNOSIS — I159 Secondary hypertension, unspecified: Secondary | ICD-10-CM | POA: Diagnosis not present

## 2015-12-17 DIAGNOSIS — N183 Chronic kidney disease, stage 3 (moderate): Secondary | ICD-10-CM | POA: Diagnosis not present

## 2015-12-17 DIAGNOSIS — E7521 Fabry (-Anderson) disease: Secondary | ICD-10-CM | POA: Diagnosis not present

## 2015-12-18 ENCOUNTER — Inpatient Hospital Stay: Payer: Medicare Other

## 2015-12-18 VITALS — BP 136/82 | HR 57 | Temp 97.9°F | Resp 20 | Wt 200.0 lb

## 2015-12-18 DIAGNOSIS — Z79899 Other long term (current) drug therapy: Secondary | ICD-10-CM | POA: Diagnosis not present

## 2015-12-18 DIAGNOSIS — E7521 Fabry (-Anderson) disease: Secondary | ICD-10-CM | POA: Diagnosis not present

## 2015-12-18 MED ORDER — SODIUM CHLORIDE 0.9 % IV SOLN
80.0000 mg | Freq: Once | INTRAVENOUS | Status: AC
  Administered 2015-12-18: 80 mg via INTRAVENOUS
  Filled 2015-12-18: qty 16

## 2015-12-18 MED ORDER — ACETAMINOPHEN 500 MG PO TABS
1000.0000 mg | ORAL_TABLET | Freq: Once | ORAL | Status: AC
  Administered 2015-12-18: 1000 mg via ORAL

## 2015-12-18 MED ORDER — SODIUM CHLORIDE 0.9 % IV SOLN
Freq: Once | INTRAVENOUS | Status: AC
  Administered 2015-12-18: 10:00:00 via INTRAVENOUS
  Filled 2015-12-18: qty 1000

## 2016-01-01 ENCOUNTER — Inpatient Hospital Stay: Payer: Medicare Other | Attending: Oncology

## 2016-01-01 ENCOUNTER — Inpatient Hospital Stay: Payer: Medicare Other

## 2016-01-01 VITALS — BP 117/74 | HR 76 | Temp 97.9°F | Resp 16 | Wt 201.0 lb

## 2016-01-01 DIAGNOSIS — E559 Vitamin D deficiency, unspecified: Secondary | ICD-10-CM | POA: Diagnosis not present

## 2016-01-01 DIAGNOSIS — E7521 Fabry (-Anderson) disease: Secondary | ICD-10-CM | POA: Diagnosis not present

## 2016-01-01 DIAGNOSIS — R531 Weakness: Secondary | ICD-10-CM | POA: Diagnosis not present

## 2016-01-01 DIAGNOSIS — Z79899 Other long term (current) drug therapy: Secondary | ICD-10-CM | POA: Insufficient documentation

## 2016-01-01 DIAGNOSIS — R5381 Other malaise: Secondary | ICD-10-CM | POA: Insufficient documentation

## 2016-01-01 DIAGNOSIS — Z7982 Long term (current) use of aspirin: Secondary | ICD-10-CM | POA: Diagnosis not present

## 2016-01-01 DIAGNOSIS — R5383 Other fatigue: Secondary | ICD-10-CM | POA: Insufficient documentation

## 2016-01-01 DIAGNOSIS — I517 Cardiomegaly: Secondary | ICD-10-CM | POA: Diagnosis not present

## 2016-01-01 DIAGNOSIS — N182 Chronic kidney disease, stage 2 (mild): Secondary | ICD-10-CM | POA: Insufficient documentation

## 2016-01-01 DIAGNOSIS — E782 Mixed hyperlipidemia: Secondary | ICD-10-CM | POA: Diagnosis not present

## 2016-01-01 DIAGNOSIS — Z880 Allergy status to penicillin: Secondary | ICD-10-CM | POA: Diagnosis not present

## 2016-01-01 DIAGNOSIS — I129 Hypertensive chronic kidney disease with stage 1 through stage 4 chronic kidney disease, or unspecified chronic kidney disease: Secondary | ICD-10-CM | POA: Insufficient documentation

## 2016-01-01 MED ORDER — SODIUM CHLORIDE 0.9 % IV SOLN
80.0000 mg | Freq: Once | INTRAVENOUS | Status: AC
  Administered 2016-01-01: 80 mg via INTRAVENOUS
  Filled 2016-01-01: qty 16

## 2016-01-01 MED ORDER — SODIUM CHLORIDE 0.9 % IV SOLN
Freq: Once | INTRAVENOUS | Status: AC
  Administered 2016-01-01: 10:00:00 via INTRAVENOUS
  Filled 2016-01-01: qty 1000

## 2016-01-01 MED ORDER — ACETAMINOPHEN 500 MG PO TABS
1000.0000 mg | ORAL_TABLET | Freq: Once | ORAL | Status: AC
  Administered 2016-01-01: 1000 mg via ORAL
  Filled 2016-01-01: qty 2

## 2016-01-15 ENCOUNTER — Inpatient Hospital Stay: Payer: Medicare Other

## 2016-01-16 NOTE — Progress Notes (Signed)
[place]  Telephone:(336) [phone] Fax:(336) [phone]  ID: AERION BAGDASARIAN OB: 1965-09-26  MR#: [id number]  [id number]#:[id number]  Patient Care Team: [facility] as PCP - General (Family Medicine)  CHIEF COMPLAINT: Fabry disease requiring Fabrazyme infusion.  INTERVAL HISTORY: Patient returns to clinic today for further evaluation and continuation of Fabrazyme infusion every 2 weeks. He continues to notice increased weakness and fatigue 1-2 days prior to his infusion, but otherwise feels well and is asymptomatic.  He has no other neurologic complaints.  He denies any recent fevers or illnesses.  He has a good appetite and denies weight loss.  He denies any pain.  He has no chest pain or shortness of breath.  He denies any nausea, vomiting, constipation, or diarrhea.  He has no urinary complaints.  Patient feels at his baseline and offers no specific complaints today.   REVIEW OF SYSTEMS:   Review of Systems  Constitutional: Positive for malaise/fatigue. Negative for fever and weight loss.  Respiratory: Negative.  Negative for cough and shortness of breath.   Cardiovascular: Negative.  Negative for chest pain and leg swelling.  Gastrointestinal: Negative.  Negative for abdominal pain.  Genitourinary: Negative.   Musculoskeletal: Negative.  Negative for myalgias.  Neurological: Positive for weakness. Negative for sensory change and focal weakness.  Psychiatric/Behavioral: Negative.  Negative for depression. The patient is not nervous/anxious.     As per HPI. Otherwise, a complete review of systems is negative.  PAST MEDICAL HISTORY: Past Medical History:  Diagnosis Date  . CKD (chronic kidney disease), stage II    a. 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 Fabry disease ([place])    a. initially diagnosed in [place], [place] in ~ 2003 to 2004, previously treated with Fabrazyme  . Fabry disease ([place])   . Hypertension   . LVH (left ventricular hypertrophy)   . Mixed  hyperlipidemia   . Vitamin D deficiency     PAST SURGICAL HISTORY: Past Surgical History:  Procedure Laterality Date  . KNEE SURGERY Left    20+ years ago, 6+ years ago  . NO PAST SURGERIES      FAMILY HISTORY: Brother with Fabry's disease, otherwise negative.     ADVANCED DIRECTIVES:    HEALTH MAINTENANCE: Social History  Substance Use Topics  . Smoking status: Never Smoker  . Smokeless tobacco: Not on file  . Alcohol use No     Colonoscopy:  PAP:  Bone density:  Lipid panel:  Allergies  Allergen Reactions  . Penicillins Other (See Comments)    unknown    Current Outpatient Prescriptions  Medication Sig Dispense Refill  . albuterol (PROVENTIL HFA;VENTOLIN HFA) 108 (90 BASE) MCG/ACT inhaler Inhale 2 puffs into the lungs every 6 (six) hours as needed for wheezing or shortness of breath. 1 Inhaler 2  . aspirin 81 MG tablet Take 81 mg by mouth daily.    . cyanocobalamin 1000 MCG tablet Take 100 mcg by mouth daily.    . diphenoxylate-atropine (LOMOTIL) 2.5-0.025 MG per tablet Take 2 tablets by mouth 4 (four) times daily as needed.     . furosemide (LASIX) 40 MG tablet Take 40 mg by mouth daily.    Marland Kitchen guaiFENesin-codeine 100-10 MG/5ML syrup Take 10 mLs by mouth 3 (three) times daily as needed. 120 mL 0  . loratadine (CLARITIN) 10 MG tablet Take 10 mg by mouth daily as needed.     . Multiple Vitamin (MULTIVITAMIN) tablet Take 1 tablet by mouth daily.    Marland Kitchen  potassium chloride SA (K-DUR,KLOR-CON) 20 MEQ tablet Take 20 mEq by mouth 2 (two) times daily.    . valsartan (DIOVAN) 160 MG tablet Take 160 mg by mouth daily. Pt takes 1 in the morning and 1/2 tablet every evening.     No current facility-administered medications for this visit.    Facility-Administered Medications Ordered in Other Visits  Medication Dose Route Frequency Provider Last Rate Last Dose  . acetaminophen (TYLENOL) tablet 1,000 mg  1,000 mg Oral Once Lloyd Huger, MD        OBJECTIVE: There were  no vitals filed for this visit.   Body mass index is 29.95 kg/m.    ECOG FS:0 - Asymptomatic  General: Well-developed, well-nourished, no acute distress. Eyes: Pink conjunctiva, anicteric sclera. Lungs: Clear to auscultation bilaterally. Heart: Regular rate and rhythm. No rubs, murmurs, or gallops. Abdomen: Soft, nontender, nondistended. No organomegaly noted, normoactive bowel sounds. Musculoskeletal: No edema, cyanosis, or clubbing. Neuro: Alert, answering all questions appropriately. Cranial nerves grossly intact. Skin: No rashes or petechiae noted. Psych: Normal affect.   LAB RESULTS:  Lab Results  Component Value Date   NA 133 (L) 05/21/2015   K 3.1 (L) 05/21/2015   CL 109 05/21/2015   CO2 17 (L) 05/21/2015   GLUCOSE 96 05/21/2015   BUN 16 05/21/2015   CREATININE 1.62 (H) 05/21/2015   CALCIUM 9.0 05/21/2015   PROT 7.7 05/21/2015   ALBUMIN 3.7 05/21/2015   AST 55 (H) 05/21/2015   ALT 86 (H) 05/21/2015   ALKPHOS 62 05/21/2015   BILITOT 0.8 05/21/2015   GFRNONAA 48 (L) 05/21/2015   GFRAA 55 (L) 05/21/2015    Lab Results  Component Value Date   WBC 7.0 05/21/2015   HGB 14.7 05/21/2015   HCT 43.4 05/21/2015   MCV 85.9 05/21/2015   PLT 370 05/21/2015     STUDIES: No results found.  ASSESSMENT: Fabry disease requiring Fabrazyme infusion.  PLAN:    1. Fabry disease requiring Fabrazyme infusion: Patient will require 1 mg/kg of Fabrazyme every 2 weeks indefinitely.  He will receive 1000 mg Tylenol and 25 mg Benadryl approximately 30 minutes prior to each infusion. He expressed understanding that we can only provide his infusion.  Any laboratory work or questions regarding his disease or infusions must be directed towards to his primary geneticist or endocrinologist.  Return to clinic every 2 weeks for his infusion and then in 6 months for routine follow-up.   Patient expressed understanding and was in agreement with this plan. He also understands that He can call  clinic at any time with any questions, concerns, or complaints.   PCP: Dr. Ernest Pine at [facility] Geneticist:  Dr. Sherryle Lis at [place], [place]   01/18/2016 6:54 AM

## 2016-01-17 ENCOUNTER — Inpatient Hospital Stay (HOSPITAL_BASED_OUTPATIENT_CLINIC_OR_DEPARTMENT_OTHER): Payer: Medicare Other | Admitting: Oncology

## 2016-01-17 ENCOUNTER — Inpatient Hospital Stay: Payer: Medicare Other

## 2016-01-17 VITALS — Wt 202.8 lb

## 2016-01-17 VITALS — BP 124/73 | HR 51 | Temp 97.8°F | Resp 18

## 2016-01-17 DIAGNOSIS — I129 Hypertensive chronic kidney disease with stage 1 through stage 4 chronic kidney disease, or unspecified chronic kidney disease: Secondary | ICD-10-CM

## 2016-01-17 DIAGNOSIS — I517 Cardiomegaly: Secondary | ICD-10-CM

## 2016-01-17 DIAGNOSIS — R5381 Other malaise: Secondary | ICD-10-CM | POA: Diagnosis not present

## 2016-01-17 DIAGNOSIS — E559 Vitamin D deficiency, unspecified: Secondary | ICD-10-CM

## 2016-01-17 DIAGNOSIS — R5383 Other fatigue: Secondary | ICD-10-CM | POA: Diagnosis not present

## 2016-01-17 DIAGNOSIS — R531 Weakness: Secondary | ICD-10-CM | POA: Diagnosis not present

## 2016-01-17 DIAGNOSIS — Z7982 Long term (current) use of aspirin: Secondary | ICD-10-CM

## 2016-01-17 DIAGNOSIS — Z79899 Other long term (current) drug therapy: Secondary | ICD-10-CM | POA: Diagnosis not present

## 2016-01-17 DIAGNOSIS — E782 Mixed hyperlipidemia: Secondary | ICD-10-CM

## 2016-01-17 DIAGNOSIS — N182 Chronic kidney disease, stage 2 (mild): Secondary | ICD-10-CM

## 2016-01-17 DIAGNOSIS — Z880 Allergy status to penicillin: Secondary | ICD-10-CM

## 2016-01-17 DIAGNOSIS — E7521 Fabry (-Anderson) disease: Secondary | ICD-10-CM

## 2016-01-17 MED ORDER — SODIUM CHLORIDE 0.9 % IV SOLN
Freq: Once | INTRAVENOUS | Status: AC
  Administered 2016-01-17: 09:00:00 via INTRAVENOUS
  Filled 2016-01-17: qty 1000

## 2016-01-17 MED ORDER — ACETAMINOPHEN 500 MG PO TABS
1000.0000 mg | ORAL_TABLET | Freq: Once | ORAL | Status: AC
  Administered 2016-01-17: 1000 mg via ORAL
  Filled 2016-01-17: qty 2

## 2016-01-17 MED ORDER — SODIUM CHLORIDE 0.9 % IV SOLN
80.0000 mg | Freq: Once | INTRAVENOUS | Status: AC
  Administered 2016-01-17: 80 mg via INTRAVENOUS
  Filled 2016-01-17: qty 14

## 2016-01-29 ENCOUNTER — Inpatient Hospital Stay: Payer: Medicare Other

## 2016-01-29 VITALS — BP 130/75 | HR 61 | Temp 96.4°F | Resp 20 | Wt 200.1 lb

## 2016-01-29 DIAGNOSIS — E7521 Fabry (-Anderson) disease: Secondary | ICD-10-CM

## 2016-01-29 DIAGNOSIS — Z79899 Other long term (current) drug therapy: Secondary | ICD-10-CM | POA: Diagnosis not present

## 2016-01-29 DIAGNOSIS — R5381 Other malaise: Secondary | ICD-10-CM | POA: Diagnosis not present

## 2016-01-29 DIAGNOSIS — R531 Weakness: Secondary | ICD-10-CM | POA: Diagnosis not present

## 2016-01-29 DIAGNOSIS — R5383 Other fatigue: Secondary | ICD-10-CM | POA: Diagnosis not present

## 2016-01-29 DIAGNOSIS — I129 Hypertensive chronic kidney disease with stage 1 through stage 4 chronic kidney disease, or unspecified chronic kidney disease: Secondary | ICD-10-CM | POA: Diagnosis not present

## 2016-01-29 MED ORDER — SODIUM CHLORIDE 0.9 % IV SOLN
80.0000 mg | Freq: Once | INTRAVENOUS | Status: AC
  Administered 2016-01-29: 80 mg via INTRAVENOUS
  Filled 2016-01-29: qty 16

## 2016-01-29 MED ORDER — SODIUM CHLORIDE 0.9 % IV SOLN
Freq: Once | INTRAVENOUS | Status: AC
  Administered 2016-01-29: 10:00:00 via INTRAVENOUS
  Filled 2016-01-29: qty 1000

## 2016-01-29 MED ORDER — ACETAMINOPHEN 500 MG PO TABS
1000.0000 mg | ORAL_TABLET | Freq: Once | ORAL | Status: AC
  Administered 2016-01-29: 1000 mg via ORAL
  Filled 2016-01-29: qty 2

## 2016-02-12 ENCOUNTER — Inpatient Hospital Stay: Payer: Medicare Other | Attending: Oncology

## 2016-02-12 VITALS — BP 133/65 | HR 71 | Temp 98.0°F | Resp 18

## 2016-02-12 DIAGNOSIS — E7521 Fabry (-Anderson) disease: Secondary | ICD-10-CM | POA: Diagnosis not present

## 2016-02-12 DIAGNOSIS — Z79899 Other long term (current) drug therapy: Secondary | ICD-10-CM | POA: Diagnosis not present

## 2016-02-12 MED ORDER — SODIUM CHLORIDE 0.9 % IV SOLN
80.0000 mg | Freq: Once | INTRAVENOUS | Status: AC
  Administered 2016-02-12: 80 mg via INTRAVENOUS
  Filled 2016-02-12: qty 16

## 2016-02-12 MED ORDER — ACETAMINOPHEN 500 MG PO TABS
1000.0000 mg | ORAL_TABLET | Freq: Once | ORAL | Status: AC
  Administered 2016-02-12: 1000 mg via ORAL
  Filled 2016-02-12: qty 2

## 2016-02-12 MED ORDER — SODIUM CHLORIDE 0.9 % IV SOLN
Freq: Once | INTRAVENOUS | Status: AC
  Administered 2016-02-12: 10:00:00 via INTRAVENOUS
  Filled 2016-02-12: qty 1000

## 2016-02-26 ENCOUNTER — Inpatient Hospital Stay: Payer: Medicare Other

## 2016-02-26 VITALS — BP 117/81 | HR 62 | Temp 98.6°F | Resp 20 | Wt 199.4 lb

## 2016-02-26 DIAGNOSIS — E7521 Fabry (-Anderson) disease: Secondary | ICD-10-CM | POA: Diagnosis not present

## 2016-02-26 DIAGNOSIS — Z79899 Other long term (current) drug therapy: Secondary | ICD-10-CM | POA: Diagnosis not present

## 2016-02-26 MED ORDER — SODIUM CHLORIDE 0.9 % IV SOLN
80.0000 mg | Freq: Once | INTRAVENOUS | Status: AC
  Administered 2016-02-26: 80 mg via INTRAVENOUS
  Filled 2016-02-26: qty 16

## 2016-02-26 MED ORDER — ACETAMINOPHEN 500 MG PO TABS
1000.0000 mg | ORAL_TABLET | Freq: Once | ORAL | Status: AC
  Administered 2016-02-26: 1000 mg via ORAL
  Filled 2016-02-26: qty 2

## 2016-02-26 MED ORDER — SODIUM CHLORIDE 0.9 % IV SOLN
Freq: Once | INTRAVENOUS | Status: AC
  Administered 2016-02-26: 10:00:00 via INTRAVENOUS
  Filled 2016-02-26: qty 1000

## 2016-03-11 ENCOUNTER — Inpatient Hospital Stay: Payer: Medicare Other | Attending: Oncology

## 2016-03-11 DIAGNOSIS — Z79899 Other long term (current) drug therapy: Secondary | ICD-10-CM | POA: Insufficient documentation

## 2016-03-11 DIAGNOSIS — E7521 Fabry (-Anderson) disease: Secondary | ICD-10-CM | POA: Insufficient documentation

## 2016-03-11 MED ORDER — ACETAMINOPHEN 500 MG PO TABS
1000.0000 mg | ORAL_TABLET | Freq: Once | ORAL | Status: AC
  Administered 2016-03-11: 1000 mg via ORAL
  Filled 2016-03-11: qty 2

## 2016-03-11 MED ORDER — SODIUM CHLORIDE 0.9 % IV SOLN
Freq: Once | INTRAVENOUS | Status: AC
  Administered 2016-03-11: 10:00:00 via INTRAVENOUS
  Filled 2016-03-11: qty 1000

## 2016-03-11 MED ORDER — SODIUM CHLORIDE 0.9 % IV SOLN
80.0000 mg | Freq: Once | INTRAVENOUS | Status: AC
  Administered 2016-03-11: 80 mg via INTRAVENOUS
  Filled 2016-03-11: qty 14

## 2016-03-25 ENCOUNTER — Inpatient Hospital Stay: Payer: Medicare Other

## 2016-03-25 VITALS — BP 124/68 | HR 72 | Temp 96.4°F | Resp 18

## 2016-03-25 DIAGNOSIS — E7521 Fabry (-Anderson) disease: Secondary | ICD-10-CM | POA: Diagnosis not present

## 2016-03-25 DIAGNOSIS — Z79899 Other long term (current) drug therapy: Secondary | ICD-10-CM | POA: Diagnosis not present

## 2016-03-25 MED ORDER — SODIUM CHLORIDE 0.9 % IV SOLN
Freq: Once | INTRAVENOUS | Status: AC
  Administered 2016-03-25: 10:00:00 via INTRAVENOUS
  Filled 2016-03-25: qty 1000

## 2016-03-25 MED ORDER — SODIUM CHLORIDE 0.9 % IV SOLN
80.0000 mg | Freq: Once | INTRAVENOUS | Status: AC
  Administered 2016-03-25: 80 mg via INTRAVENOUS
  Filled 2016-03-25: qty 14

## 2016-03-25 MED ORDER — ACETAMINOPHEN 500 MG PO TABS
1000.0000 mg | ORAL_TABLET | Freq: Once | ORAL | Status: AC
  Administered 2016-03-25: 1000 mg via ORAL
  Filled 2016-03-25: qty 2

## 2016-03-31 DIAGNOSIS — R0982 Postnasal drip: Secondary | ICD-10-CM | POA: Diagnosis not present

## 2016-03-31 DIAGNOSIS — E876 Hypokalemia: Secondary | ICD-10-CM | POA: Diagnosis not present

## 2016-03-31 DIAGNOSIS — R0981 Nasal congestion: Secondary | ICD-10-CM | POA: Diagnosis not present

## 2016-04-02 ENCOUNTER — Encounter: Payer: Self-pay | Admitting: Emergency Medicine

## 2016-04-02 ENCOUNTER — Emergency Department: Payer: Medicare Other

## 2016-04-02 ENCOUNTER — Emergency Department
Admission: EM | Admit: 2016-04-02 | Discharge: 2016-04-02 | Disposition: A | Discharge status: Home / Self Care | Payer: Medicare Other | Attending: Emergency Medicine | Admitting: Emergency Medicine

## 2016-04-02 DIAGNOSIS — N182 Chronic kidney disease, stage 2 (mild): Secondary | ICD-10-CM | POA: Diagnosis not present

## 2016-04-02 DIAGNOSIS — J069 Acute upper respiratory infection, unspecified: Secondary | ICD-10-CM | POA: Insufficient documentation

## 2016-04-02 DIAGNOSIS — R05 Cough: Secondary | ICD-10-CM | POA: Diagnosis not present

## 2016-04-02 DIAGNOSIS — Z7982 Long term (current) use of aspirin: Secondary | ICD-10-CM | POA: Diagnosis not present

## 2016-04-02 DIAGNOSIS — Z79899 Other long term (current) drug therapy: Secondary | ICD-10-CM | POA: Insufficient documentation

## 2016-04-02 DIAGNOSIS — I129 Hypertensive chronic kidney disease with stage 1 through stage 4 chronic kidney disease, or unspecified chronic kidney disease: Secondary | ICD-10-CM | POA: Diagnosis not present

## 2016-04-02 DIAGNOSIS — B9789 Other viral agents as the cause of diseases classified elsewhere: Secondary | ICD-10-CM

## 2016-04-02 MED ORDER — GUAIFENESIN-CODEINE 100-10 MG/5ML PO SOLN: 5.0000 mL | ORAL | 0 refills | Status: DC | PRN

## 2016-04-02 NOTE — Discharge Instructions (Signed)
Begin taking Robitussin-AC for her cough every 4 hours as needed. Tylenol as needed for body aches, fever, headache. Follow up with your doctor at [facility] primary if any continued problems.

## 2016-04-02 NOTE — ED Notes (Signed)
Went to discharge patient, patient states he wants to speak to provider again. Patient unhappy that he is receiving cough medicine and states he needs to get better so that it doesn't turn into a chest infection d/t his kidney issues and infusions that he already has. PA notified and to go back to speak to patient.

## 2016-04-02 NOTE — ED Provider Notes (Signed)
[facility] Emergency Department Provider Note   ____________________________________________   First MD Initiated Contact with Patient 04/02/16 1010     (approximate)  I have reviewed the triage vital signs and the nursing notes.   HISTORY  Chief Complaint Cough   HPI Jeffery Hughes is a 51 y.o. male is here with complaint of cough for approximately one week. Patient states that he has productive green cough but has not been aware of any fever. Patient states that he saw his doctor [facility] couple days ago and was told to get some Mucinex which has not helped with any of his symptoms. Patient states the cough is worse at night and he actually is sitting in a chair to sleep because of the coughing. He denies any nausea, vomiting, or diarrhea. There is no one in his home with the flu at present. He denies any body aches. He is mostly concerned because of his chronic kidney disease and states he has had bronchitis in the past and got extremely sick. He rates his pain as 6/10.   Past Medical History:  Diagnosis Date  . CKD (chronic kidney disease), stage II    a. 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 Fabry disease ([place])    a. initially diagnosed in [place], [place] in ~ 2003 to 2004, previously treated with Fabrazyme  . Fabry disease ([place])   . Hypertension   . LVH (left ventricular hypertrophy)   . Mixed hyperlipidemia   . Vitamin D deficiency     Patient Active Problem List   Diagnosis Date Noted  . Chest pain 01/12/2015  . Elevated troponin 01/12/2015  . HTN (hypertension) 01/12/2015  . Fabry disease [facility])     Past Surgical History:  Procedure Laterality Date  . KNEE SURGERY Left    20+ years ago, 6+ years ago  . NO PAST SURGERIES      Prior to Admission medications   Medication Sig Start Date End Date Taking? Authorizing Provider  albuterol (PROVENTIL HFA;VENTOLIN HFA) 108 (90 BASE) MCG/ACT inhaler Inhale 2 puffs into the lungs every 6 (six) hours  as needed for wheezing or shortness of breath. 02/01/15   Victorino Dike, FNP  aspirin 81 MG tablet Take 81 mg by mouth daily.    Historical Provider, MD  cyanocobalamin 1000 MCG tablet Take 100 mcg by mouth daily.    Historical Provider, MD  diphenoxylate-atropine (LOMOTIL) 2.5-0.025 MG per tablet Take 2 tablets by mouth 4 (four) times daily as needed.  06/19/14   Historical Provider, MD  furosemide (LASIX) 40 MG tablet Take 40 mg by mouth daily.    Historical Provider, MD  guaiFENesin-codeine 100-10 MG/5ML syrup Take 5 mLs by mouth every 4 (four) hours as needed. 04/02/16   Johnn Hai, PA-C  loratadine (CLARITIN) 10 MG tablet Take 10 mg by mouth daily as needed.  05/14/14 05/14/15  Historical Provider, MD  Multiple Vitamin (MULTIVITAMIN) tablet Take 1 tablet by mouth daily.    Historical Provider, MD  potassium chloride SA (K-DUR,KLOR-CON) 20 MEQ tablet Take 20 mEq by mouth 2 (two) times daily.    Historical Provider, MD  valsartan (DIOVAN) 160 MG tablet Take 160 mg by mouth daily. Pt takes 1 in the morning and 1/2 tablet every evening.    Historical Provider, MD    Allergies Penicillins  Family History  Problem Relation Age of Onset  . Stroke Mother   . CAD Mother   . CAD Father   .  Skin cancer Father     Social History Social History  Substance Use Topics  . Smoking status: Never Smoker  . Smokeless tobacco: Never Used  . Alcohol use No    Review of Systems Constitutional: No known fever/no chills Eyes: No visual changes. ENT: No sore throat. Cardiovascular: Denies chest pain. Respiratory: Denies shortness of breath. Positive productive cough. Gastrointestinal: No abdominal pain.  No nausea, no vomiting.  No diarrhea.   Musculoskeletal: Negative for back pain. Skin: Negative for rash. Neurological: Negative for headaches, focal weakness or numbness.  10-point ROS otherwise negative.  ____________________________________________   PHYSICAL EXAM:  VITAL SIGNS: ED  Triage Vitals  Enc Vitals Group     BP 04/02/16 0935 (!) 143/99     Pulse Rate 04/02/16 0935 90     Resp 04/02/16 0935 18     Temp 04/02/16 0935 98 F (36.7 C)     Temp Source 04/02/16 0935 Oral     SpO2 04/02/16 0935 96 %     Weight 04/02/16 1008 186 lb (84.4 kg)     Height 04/02/16 1008 5\' 8"  (1.727 m)     Head Circumference --      Peak Flow --      Pain Score 04/02/16 1009 6     Pain Loc --      Pain Edu? --      Excl. in [place]? --     Constitutional: Alert and oriented. Well appearing and in no acute distress. Eyes: Conjunctivae are normal. PERRL. EOMI. Head: Atraumatic. Nose: Mild congestion/no rhinnorhea. Mouth/Throat: Mucous membranes are moist.  Oropharynx non-erythematous. Neck: No stridor.   Hematological/Lymphatic/Immunilogical: No cervical lymphadenopathy. Cardiovascular: Normal rate, regular rhythm. Grossly normal heart sounds.  Good peripheral circulation. Respiratory: Normal respiratory effort.  No retractions. Lungs CTAB. Patient does have an occasional  congested cough. Musculoskeletal: Moves upper and lower extremities without any difficulty. Normal gait was noted. Neurologic:  Normal speech and language. No gross focal neurologic deficits are appreciated. No gait instability. Skin:  Skin is warm, dry and intact. No rash noted. Psychiatric: Mood and affect are normal. Speech and behavior are normal.  ____________________________________________   LABS (all labs ordered are listed, but only abnormal results are displayed)  Labs Reviewed - No data to display  RADIOLOGY  Chest x-ray per radiologist shows no active cardiopulmonary disease. I, Johnn Hai, personally viewed and evaluated these images (plain radiographs) as part of my medical decision making, as well as reviewing the written report by the radiologist.  ____________________________________________   PROCEDURES  Procedure(s) performed: None  Procedures  Critical Care performed:  No  ____________________________________________   INITIAL IMPRESSION / ASSESSMENT AND PLAN / ED COURSE  Pertinent labs & imaging results that were available during my care of the patient were reviewed by me and considered in my medical decision making (see chart for details).  51 year old with complaint of productive cough. Chest x-ray showed no active cardiopulmonary disease. Patient has been taking over-the-counter medication without any relief. Patient is a nonsmoker. Patient isn't taking Mucinex over-the-counter as suggested by his PCP without any improvement. Patient became extremely argumentative when told that his chest x-ray did not show any bronchitis or pneumonia. He is very disgruntled and demanding an antibiotic despite information of viral infections not responding to antibiotics. I explained to him that his home PCP did not prescribe an antibiotic and that he has not been taking prescription cough medication prior to today. Patient was given a prescription for Robitussin-AC as  needed for cough. Patient disapproves of his treatment plan. Patient was discharged with his prescription.   ____________________________________________   FINAL CLINICAL IMPRESSION(S) / ED DIAGNOSES  Final diagnoses:  Viral URI with cough      NEW MEDICATIONS STARTED DURING THIS VISIT:  Discharge Medication List as of 04/02/2016 11:25 AM       Note:  This document was prepared using Dragon voice recognition software and may include unintentional dictation errors.    Johnn Hai, PA-C 04/02/16 1158    Harvest Dark, MD 04/02/16 1500

## 2016-04-02 NOTE — ED Triage Notes (Signed)
Presents with cough which started past weekend   Cough is prod  Green in color

## 2016-04-02 NOTE — ED Notes (Signed)
States he was seen on Monday for cough   States cough has progressed  No fever states cough is prod  Greenish phlegm

## 2016-04-08 ENCOUNTER — Inpatient Hospital Stay: Payer: Medicare Other | Attending: Oncology

## 2016-04-08 ENCOUNTER — Ambulatory Visit: Payer: Medicare Other

## 2016-04-08 DIAGNOSIS — E7521 Fabry (-Anderson) disease: Secondary | ICD-10-CM | POA: Insufficient documentation

## 2016-04-08 DIAGNOSIS — Z79899 Other long term (current) drug therapy: Secondary | ICD-10-CM | POA: Insufficient documentation

## 2016-04-22 ENCOUNTER — Ambulatory Visit: Payer: Medicare Other

## 2016-04-22 ENCOUNTER — Inpatient Hospital Stay: Payer: Medicare Other

## 2016-04-22 VITALS — BP 116/72 | HR 60 | Temp 97.3°F | Resp 18

## 2016-04-22 DIAGNOSIS — E7521 Fabry (-Anderson) disease: Secondary | ICD-10-CM | POA: Diagnosis not present

## 2016-04-22 DIAGNOSIS — Z79899 Other long term (current) drug therapy: Secondary | ICD-10-CM | POA: Diagnosis not present

## 2016-04-22 MED ORDER — ACETAMINOPHEN 500 MG PO TABS
1000.0000 mg | ORAL_TABLET | Freq: Once | ORAL | Status: AC
  Administered 2016-04-22: 1000 mg via ORAL
  Filled 2016-04-22: qty 2

## 2016-04-22 MED ORDER — SODIUM CHLORIDE 0.9 % IV SOLN
Freq: Once | INTRAVENOUS | Status: AC
  Administered 2016-04-22: 09:00:00 via INTRAVENOUS
  Filled 2016-04-22: qty 1000

## 2016-04-22 MED ORDER — SODIUM CHLORIDE 0.9 % IV SOLN
80.0000 mg | Freq: Once | INTRAVENOUS | Status: AC
  Administered 2016-04-22: 80 mg via INTRAVENOUS
  Filled 2016-04-22: qty 14

## 2016-05-06 ENCOUNTER — Inpatient Hospital Stay: Payer: Medicare Other | Attending: Oncology

## 2016-05-06 ENCOUNTER — Ambulatory Visit: Payer: Medicare Other

## 2016-05-06 VITALS — BP 122/78 | HR 75 | Temp 97.2°F | Resp 18

## 2016-05-06 DIAGNOSIS — E7521 Fabry (-Anderson) disease: Secondary | ICD-10-CM | POA: Diagnosis not present

## 2016-05-06 DIAGNOSIS — Z79899 Other long term (current) drug therapy: Secondary | ICD-10-CM | POA: Insufficient documentation

## 2016-05-06 MED ORDER — SODIUM CHLORIDE 0.9 % IV SOLN
Freq: Once | INTRAVENOUS | Status: AC
  Administered 2016-05-06: 10:00:00 via INTRAVENOUS
  Filled 2016-05-06: qty 1000

## 2016-05-06 MED ORDER — SODIUM CHLORIDE 0.9 % IV SOLN
80.0000 mg | Freq: Once | INTRAVENOUS | Status: AC
  Administered 2016-05-06: 80 mg via INTRAVENOUS
  Filled 2016-05-06: qty 14

## 2016-05-06 MED ORDER — ACETAMINOPHEN 500 MG PO TABS
1000.0000 mg | ORAL_TABLET | Freq: Once | ORAL | Status: AC
  Administered 2016-05-06: 1000 mg via ORAL
  Filled 2016-05-06: qty 2

## 2016-05-06 NOTE — Patient Instructions (Signed)
Agalsidase Beta injection What is this medicine? AGALSIDASE BETA is used to replace an enzyme that is missing in patients with Fabry disease. It is not a cure. This medicine may be used for other purposes; ask your health care provider or pharmacist if you have questions. COMMON BRAND NAME(S): Fabrazyme What should I tell my health care provider before I take this medicine? They need to know if you have any of these conditions: -heart disease -an unusual or allergic reaction to agalsidase beta, mannitol, other medicines, foods, dyes, or preservatives -pregnant or trying to get pregnant -breast-feeding How should I use this medicine? This medicine is for infusion into a vein. It is given by a health care professional in a hospital or clinic setting. Talk to your pediatrician regarding the use of this medicine in children. Special care may be needed. Overdosage: If you think you have taken too much of this medicine contact a poison control center or emergency room at once. NOTE: This medicine is only for you. Do not share this medicine with others. What if I miss a dose? It is important not to miss your dose. Call your doctor or health care professional if you are unable to keep an appointment. What may interact with this medicine? -amiodarone -chloroquine -gentamicin -hydroxychloroquine -monobenzone This list may not describe all possible interactions. Give your health care provider a list of all the medicines, herbs, non-prescription drugs, or dietary supplements you use. Also tell them if you smoke, drink alcohol, or use illegal drugs. Some items may interact with your medicine. What should I watch for while using this medicine? Visit your doctor or health care professional for regular checks on your progress. Tell your doctor or healthcare professional if your symptoms do not start to get better or if they get worse. There is a registry for patients with Fabry disease. The registry is  used to gather information about the disease and its effects. Talk to your health care provider if you would like to join the registry. What side effects may I notice from receiving this medicine? Side effects that you should report to your doctor or health care professional as soon as possible: -allergic reactions like skin rash, itching or hives, swelling of the face, lips, or tongue -breathing problems -chest pain, tightness -depression -dizziness -fast, irregular heart beat -swelling of the arms or legs Side effects that usually do not require medical attention (report to your doctor or health care professional if they continue or are bothersome): -aches or pains -anxiety -fever or chills at the time of injection -headache -nausea, vomiting -stomach pain, upset This list may not describe all possible side effects. Call your doctor for medical advice about side effects. You may report side effects to FDA at 1-800-FDA-1088. Where should I keep my medicine? This drug is given in a hospital or clinic and will not be stored at home. NOTE: This sheet is a summary. It may not cover all possible information. If you have questions about this medicine, talk to your doctor, pharmacist, or health care provider.  2018 Elsevier/Gold Standard (2005-10-26 12:25:00)  

## 2016-05-14 DIAGNOSIS — E7521 Fabry (-Anderson) disease: Secondary | ICD-10-CM | POA: Diagnosis not present

## 2016-05-14 DIAGNOSIS — I159 Secondary hypertension, unspecified: Secondary | ICD-10-CM | POA: Diagnosis not present

## 2016-05-14 DIAGNOSIS — I517 Cardiomegaly: Secondary | ICD-10-CM | POA: Diagnosis not present

## 2016-05-14 DIAGNOSIS — R808 Other proteinuria: Secondary | ICD-10-CM | POA: Diagnosis not present

## 2016-05-14 DIAGNOSIS — N183 Chronic kidney disease, stage 3 (moderate): Secondary | ICD-10-CM | POA: Diagnosis not present

## 2016-05-14 DIAGNOSIS — Z79899 Other long term (current) drug therapy: Secondary | ICD-10-CM | POA: Diagnosis not present

## 2016-05-14 DIAGNOSIS — I129 Hypertensive chronic kidney disease with stage 1 through stage 4 chronic kidney disease, or unspecified chronic kidney disease: Secondary | ICD-10-CM | POA: Diagnosis not present

## 2016-05-14 DIAGNOSIS — Z8249 Family history of ischemic heart disease and other diseases of the circulatory system: Secondary | ICD-10-CM | POA: Diagnosis not present

## 2016-05-19 MED ORDER — SODIUM CHLORIDE 0.9 % IV SOLN
Freq: Once | INTRAVENOUS | Status: AC
  Administered 2016-05-20: 10:00:00 via INTRAVENOUS
  Filled 2016-05-19: qty 1000

## 2016-05-19 MED ORDER — ACETAMINOPHEN 500 MG PO TABS
1000.0000 mg | ORAL_TABLET | Freq: Once | ORAL | Status: AC
  Administered 2016-05-20: 1000 mg via ORAL
  Filled 2016-05-19: qty 2

## 2016-05-19 MED ORDER — AGALSIDASE BETA 5 MG IV SOLR
80.0000 mg | Freq: Once | INTRAVENOUS | Status: AC
  Administered 2016-05-20: 80 mg via INTRAVENOUS
  Filled 2016-05-19: qty 14

## 2016-05-20 ENCOUNTER — Ambulatory Visit: Payer: Medicare Other

## 2016-05-20 ENCOUNTER — Inpatient Hospital Stay: Payer: Medicare Other

## 2016-05-20 VITALS — BP 126/76 | HR 74 | Temp 97.1°F | Resp 18

## 2016-05-20 DIAGNOSIS — Z79899 Other long term (current) drug therapy: Secondary | ICD-10-CM | POA: Diagnosis not present

## 2016-05-20 DIAGNOSIS — E7521 Fabry (-Anderson) disease: Secondary | ICD-10-CM

## 2016-06-03 ENCOUNTER — Ambulatory Visit: Payer: Medicare Other

## 2016-06-04 MED ORDER — SODIUM CHLORIDE 0.9 % IV SOLN
Freq: Once | INTRAVENOUS | Status: AC
  Administered 2016-06-05: 09:00:00 via INTRAVENOUS
  Filled 2016-06-04: qty 1000

## 2016-06-04 MED ORDER — ACETAMINOPHEN 500 MG PO TABS
1000.0000 mg | ORAL_TABLET | Freq: Once | ORAL | Status: AC
  Administered 2016-06-05: 1000 mg via ORAL
  Filled 2016-06-04: qty 2

## 2016-06-04 MED ORDER — SODIUM CHLORIDE 0.9 % IV SOLN
80.0000 mg | Freq: Once | INTRAVENOUS | Status: AC
  Administered 2016-06-05: 80 mg via INTRAVENOUS
  Filled 2016-06-04: qty 2

## 2016-06-05 ENCOUNTER — Inpatient Hospital Stay: Payer: Medicare Other | Attending: Oncology

## 2016-06-05 VITALS — BP 126/85 | HR 76 | Temp 97.1°F | Resp 18

## 2016-06-05 DIAGNOSIS — Z79899 Other long term (current) drug therapy: Secondary | ICD-10-CM | POA: Diagnosis not present

## 2016-06-05 DIAGNOSIS — E7521 Fabry (-Anderson) disease: Secondary | ICD-10-CM | POA: Insufficient documentation

## 2016-06-05 NOTE — Patient Instructions (Signed)
Agalsidase Beta injection What is this medicine? AGALSIDASE BETA is used to replace an enzyme that is missing in patients with Fabry disease. It is not a cure. This medicine may be used for other purposes; ask your health care provider or pharmacist if you have questions. COMMON BRAND NAME(S): Fabrazyme What should I tell my health care provider before I take this medicine? They need to know if you have any of these conditions: -heart disease -an unusual or allergic reaction to agalsidase beta, mannitol, other medicines, foods, dyes, or preservatives -pregnant or trying to get pregnant -breast-feeding How should I use this medicine? This medicine is for infusion into a vein. It is given by a health care professional in a hospital or clinic setting. Talk to your pediatrician regarding the use of this medicine in children. Special care may be needed. Overdosage: If you think you have taken too much of this medicine contact a poison control center or emergency room at once. NOTE: This medicine is only for you. Do not share this medicine with others. What if I miss a dose? It is important not to miss your dose. Call your doctor or health care professional if you are unable to keep an appointment. What may interact with this medicine? -amiodarone -chloroquine -gentamicin -hydroxychloroquine -monobenzone This list may not describe all possible interactions. Give your health care provider a list of all the medicines, herbs, non-prescription drugs, or dietary supplements you use. Also tell them if you smoke, drink alcohol, or use illegal drugs. Some items may interact with your medicine. What should I watch for while using this medicine? Visit your doctor or health care professional for regular checks on your progress. Tell your doctor or healthcare professional if your symptoms do not start to get better or if they get worse. There is a registry for patients with Fabry disease. The registry is  used to gather information about the disease and its effects. Talk to your health care provider if you would like to join the registry. What side effects may I notice from receiving this medicine? Side effects that you should report to your doctor or health care professional as soon as possible: -allergic reactions like skin rash, itching or hives, swelling of the face, lips, or tongue -breathing problems -chest pain, tightness -depression -dizziness -fast, irregular heart beat -swelling of the arms or legs Side effects that usually do not require medical attention (report to your doctor or health care professional if they continue or are bothersome): -aches or pains -anxiety -fever or chills at the time of injection -headache -nausea, vomiting -stomach pain, upset This list may not describe all possible side effects. Call your doctor for medical advice about side effects. You may report side effects to FDA at 1-800-FDA-1088. Where should I keep my medicine? This drug is given in a hospital or clinic and will not be stored at home. NOTE: This sheet is a summary. It may not cover all possible information. If you have questions about this medicine, talk to your doctor, pharmacist, or health care provider.  2018 Elsevier/Gold Standard (2005-10-26 12:25:00)  

## 2016-06-16 MED ORDER — SODIUM CHLORIDE 0.9 % IV SOLN
Freq: Once | INTRAVENOUS | Status: AC
  Administered 2016-06-17: 09:00:00 via INTRAVENOUS
  Filled 2016-06-16: qty 1000

## 2016-06-16 MED ORDER — ACETAMINOPHEN 500 MG PO TABS
1000.0000 mg | ORAL_TABLET | Freq: Once | ORAL | Status: AC
  Administered 2016-06-17: 1000 mg via ORAL
  Filled 2016-06-16: qty 2

## 2016-06-16 MED ORDER — SODIUM CHLORIDE 0.9 % IV SOLN
80.0000 mg | Freq: Once | INTRAVENOUS | Status: AC
  Administered 2016-06-17: 80 mg via INTRAVENOUS
  Filled 2016-06-16: qty 14

## 2016-06-17 ENCOUNTER — Ambulatory Visit: Payer: Medicare Other

## 2016-06-17 ENCOUNTER — Inpatient Hospital Stay: Payer: Medicare Other

## 2016-06-17 VITALS — BP 134/79 | HR 58 | Temp 97.8°F | Resp 24 | Wt 201.9 lb

## 2016-06-17 DIAGNOSIS — Z79899 Other long term (current) drug therapy: Secondary | ICD-10-CM | POA: Diagnosis not present

## 2016-06-17 DIAGNOSIS — E7521 Fabry (-Anderson) disease: Secondary | ICD-10-CM

## 2016-06-17 NOTE — Patient Instructions (Signed)
Agalsidase Beta injection What is this medicine? AGALSIDASE BETA is used to replace an enzyme that is missing in patients with Fabry disease. It is not a cure. This medicine may be used for other purposes; ask your health care provider or pharmacist if you have questions. COMMON BRAND NAME(S): Fabrazyme What should I tell my health care provider before I take this medicine? They need to know if you have any of these conditions: -heart disease -an unusual or allergic reaction to agalsidase beta, mannitol, other medicines, foods, dyes, or preservatives -pregnant or trying to get pregnant -breast-feeding How should I use this medicine? This medicine is for infusion into a vein. It is given by a health care professional in a hospital or clinic setting. Talk to your pediatrician regarding the use of this medicine in children. Special care may be needed. Overdosage: If you think you have taken too much of this medicine contact a poison control center or emergency room at once. NOTE: This medicine is only for you. Do not share this medicine with others. What if I miss a dose? It is important not to miss your dose. Call your doctor or health care professional if you are unable to keep an appointment. What may interact with this medicine? -amiodarone -chloroquine -gentamicin -hydroxychloroquine -monobenzone This list may not describe all possible interactions. Give your health care provider a list of all the medicines, herbs, non-prescription drugs, or dietary supplements you use. Also tell them if you smoke, drink alcohol, or use illegal drugs. Some items may interact with your medicine. What should I watch for while using this medicine? Visit your doctor or health care professional for regular checks on your progress. Tell your doctor or healthcare professional if your symptoms do not start to get better or if they get worse. There is a registry for patients with Fabry disease. The registry is  used to gather information about the disease and its effects. Talk to your health care provider if you would like to join the registry. What side effects may I notice from receiving this medicine? Side effects that you should report to your doctor or health care professional as soon as possible: -allergic reactions like skin rash, itching or hives, swelling of the face, lips, or tongue -breathing problems -chest pain, tightness -depression -dizziness -fast, irregular heart beat -swelling of the arms or legs Side effects that usually do not require medical attention (report to your doctor or health care professional if they continue or are bothersome): -aches or pains -anxiety -fever or chills at the time of injection -headache -nausea, vomiting -stomach pain, upset This list may not describe all possible side effects. Call your doctor for medical advice about side effects. You may report side effects to FDA at 1-800-FDA-1088. Where should I keep my medicine? This drug is given in a hospital or clinic and will not be stored at home. NOTE: This sheet is a summary. It may not cover all possible information. If you have questions about this medicine, talk to your doctor, pharmacist, or health care provider.  2018 Elsevier/Gold Standard (2005-10-26 12:25:00)  

## 2016-07-01 ENCOUNTER — Inpatient Hospital Stay: Payer: Medicare Other | Attending: Oncology

## 2016-07-01 ENCOUNTER — Ambulatory Visit: Payer: Medicare Other

## 2016-07-01 VITALS — BP 119/76 | HR 61 | Temp 97.7°F | Resp 24 | Wt 198.3 lb

## 2016-07-01 DIAGNOSIS — Z79899 Other long term (current) drug therapy: Secondary | ICD-10-CM | POA: Insufficient documentation

## 2016-07-01 DIAGNOSIS — Z7982 Long term (current) use of aspirin: Secondary | ICD-10-CM | POA: Diagnosis not present

## 2016-07-01 DIAGNOSIS — N182 Chronic kidney disease, stage 2 (mild): Secondary | ICD-10-CM | POA: Diagnosis not present

## 2016-07-01 DIAGNOSIS — E559 Vitamin D deficiency, unspecified: Secondary | ICD-10-CM | POA: Diagnosis not present

## 2016-07-01 DIAGNOSIS — E782 Mixed hyperlipidemia: Secondary | ICD-10-CM | POA: Insufficient documentation

## 2016-07-01 DIAGNOSIS — Z880 Allergy status to penicillin: Secondary | ICD-10-CM | POA: Insufficient documentation

## 2016-07-01 DIAGNOSIS — R5381 Other malaise: Secondary | ICD-10-CM | POA: Diagnosis not present

## 2016-07-01 DIAGNOSIS — I517 Cardiomegaly: Secondary | ICD-10-CM | POA: Insufficient documentation

## 2016-07-01 DIAGNOSIS — E7521 Fabry (-Anderson) disease: Secondary | ICD-10-CM | POA: Diagnosis not present

## 2016-07-01 DIAGNOSIS — R5383 Other fatigue: Secondary | ICD-10-CM | POA: Diagnosis not present

## 2016-07-01 DIAGNOSIS — R531 Weakness: Secondary | ICD-10-CM | POA: Diagnosis not present

## 2016-07-01 DIAGNOSIS — I129 Hypertensive chronic kidney disease with stage 1 through stage 4 chronic kidney disease, or unspecified chronic kidney disease: Secondary | ICD-10-CM | POA: Diagnosis not present

## 2016-07-01 MED ORDER — ACETAMINOPHEN 500 MG PO TABS
1000.0000 mg | ORAL_TABLET | Freq: Once | ORAL | Status: AC
  Administered 2016-07-01: 1000 mg via ORAL
  Filled 2016-07-01: qty 2

## 2016-07-01 MED ORDER — SODIUM CHLORIDE 0.9 % IV SOLN
Freq: Once | INTRAVENOUS | Status: AC
  Administered 2016-07-01: 09:00:00 via INTRAVENOUS
  Filled 2016-07-01: qty 1000

## 2016-07-01 MED ORDER — AGALSIDASE BETA 5 MG IV SOLR
80.0000 mg | Freq: Once | INTRAVENOUS | Status: AC
  Administered 2016-07-01: 80 mg via INTRAVENOUS
  Filled 2016-07-01: qty 2

## 2016-07-01 NOTE — Patient Instructions (Signed)
Agalsidase Beta injection What is this medicine? AGALSIDASE BETA is used to replace an enzyme that is missing in patients with Fabry disease. It is not a cure. This medicine may be used for other purposes; ask your health care provider or pharmacist if you have questions. COMMON BRAND NAME(S): Fabrazyme What should I tell my health care provider before I take this medicine? They need to know if you have any of these conditions: -heart disease -an unusual or allergic reaction to agalsidase beta, mannitol, other medicines, foods, dyes, or preservatives -pregnant or trying to get pregnant -breast-feeding How should I use this medicine? This medicine is for infusion into a vein. It is given by a health care professional in a hospital or clinic setting. Talk to your pediatrician regarding the use of this medicine in children. Special care may be needed. Overdosage: If you think you have taken too much of this medicine contact a poison control center or emergency room at once. NOTE: This medicine is only for you. Do not share this medicine with others. What if I miss a dose? It is important not to miss your dose. Call your doctor or health care professional if you are unable to keep an appointment. What may interact with this medicine? -amiodarone -chloroquine -gentamicin -hydroxychloroquine -monobenzone This list may not describe all possible interactions. Give your health care provider a list of all the medicines, herbs, non-prescription drugs, or dietary supplements you use. Also tell them if you smoke, drink alcohol, or use illegal drugs. Some items may interact with your medicine. What should I watch for while using this medicine? Visit your doctor or health care professional for regular checks on your progress. Tell your doctor or healthcare professional if your symptoms do not start to get better or if they get worse. There is a registry for patients with Fabry disease. The registry is  used to gather information about the disease and its effects. Talk to your health care provider if you would like to join the registry. What side effects may I notice from receiving this medicine? Side effects that you should report to your doctor or health care professional as soon as possible: -allergic reactions like skin rash, itching or hives, swelling of the face, lips, or tongue -breathing problems -chest pain, tightness -depression -dizziness -fast, irregular heart beat -swelling of the arms or legs Side effects that usually do not require medical attention (report to your doctor or health care professional if they continue or are bothersome): -aches or pains -anxiety -fever or chills at the time of injection -headache -nausea, vomiting -stomach pain, upset This list may not describe all possible side effects. Call your doctor for medical advice about side effects. You may report side effects to FDA at 1-800-FDA-1088. Where should I keep my medicine? This drug is given in a hospital or clinic and will not be stored at home. NOTE: This sheet is a summary. It may not cover all possible information. If you have questions about this medicine, talk to your doctor, pharmacist, or health care provider.  2018 Elsevier/Gold Standard (2005-10-26 12:25:00)  

## 2016-07-15 NOTE — Progress Notes (Signed)
[place]  Telephone:(336) [phone] [phone]) [phone]  ID: Jeffery Hughes OB: [birth year]  MR#: [id number]  BHA#:[id number]  Patient Care Team: [facility] as PCP - General (Family Medicine)  CHIEF COMPLAINT: Fabry disease requiring Fabrazyme infusion.  INTERVAL HISTORY: Patient returns to clinic today for further evaluation and continuation of Fabrazyme infusion every 2 weeks. He continues to notice increased weakness and fatigue 1-2 days prior to his infusion, but otherwise feels well and is asymptomatic.  He has no other neurologic complaints.  He denies any recent fevers or illnesses.  He has a good appetite and denies weight loss.  He denies any pain.  He has no chest pain or shortness of breath.  He denies any nausea, vomiting, constipation, or diarrhea.  He has no urinary complaints.  Patient feels at his baseline and offers no specific complaints today.   REVIEW OF SYSTEMS:   Review of Systems  Constitutional: Positive for malaise/fatigue. Negative for fever and weight loss.  Respiratory: Negative.  Negative for cough and shortness of breath.   Cardiovascular: Negative.  Negative for chest pain and leg swelling.  Gastrointestinal: Negative.  Negative for abdominal pain.  Genitourinary: Negative.   Musculoskeletal: Negative.  Negative for myalgias.  Skin: Negative.  Negative for rash.  Neurological: Positive for weakness. Negative for sensory change and focal weakness.  Psychiatric/Behavioral: Negative.  Negative for depression. The patient is not nervous/anxious.     As per HPI. Otherwise, a complete review of systems is negative.  PAST MEDICAL HISTORY: Past Medical History:  Diagnosis Date  . CKD (chronic kidney disease), stage II    a. 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 Fabry disease ([place])    a. initially diagnosed in [place], [place] in ~ 2003 to 2004, previously treated with Fabrazyme  . Fabry disease ([place])   . Hypertension   . LVH  (left ventricular hypertrophy)   . Mixed hyperlipidemia   . Vitamin D deficiency     PAST SURGICAL HISTORY: Past Surgical History:  Procedure Laterality Date  . KNEE SURGERY Left    20+ years ago, 6+ years ago  . NO PAST SURGERIES      FAMILY HISTORY: Brother with Fabry's disease, otherwise negative.     ADVANCED DIRECTIVES:    HEALTH MAINTENANCE: Social History  Substance Use Topics  . Smoking status: Never Smoker  . Smokeless tobacco: Never Used  . Alcohol use No     Colonoscopy:  PAP:  Bone density:  Lipid panel:  Allergies  Allergen Reactions  . Other Other (See Comments)    Nephrologist recommended avoiding contrast.  . Penicillins Other (See Comments)    unknown    Current Outpatient Prescriptions  Medication Sig Dispense Refill  . albuterol (PROVENTIL HFA;VENTOLIN HFA) 108 (90 BASE) MCG/ACT inhaler Inhale 2 puffs into the lungs every 6 (six) hours as needed for wheezing or shortness of breath. 1 Inhaler 2  . aspirin 81 MG tablet Take 81 mg by mouth daily.    . cyanocobalamin 1000 MCG tablet Take 100 mcg by mouth daily.    . diphenoxylate-atropine (LOMOTIL) 2.5-0.025 MG per tablet Take 2 tablets by mouth 4 (four) times daily as needed.     . furosemide (LASIX) 40 MG tablet Take 40 mg by mouth daily.    Marland Kitchen guaiFENesin-codeine 100-10 MG/5ML syrup Take 5 mLs by mouth every 4 (four) hours as needed. 120 mL 0  . Multiple Vitamin (MULTIVITAMIN) tablet Take 1 tablet by mouth daily.    Marland Kitchen  potassium chloride SA (K-DUR,KLOR-CON) 20 MEQ tablet Take 20 mEq by mouth 2 (two) times daily.    . valsartan (DIOVAN) 160 MG tablet Take 160 mg by mouth daily. Pt takes 1 in the morning and 1/2 tablet every evening.    . loratadine (CLARITIN) 10 MG tablet Take 10 mg by mouth daily as needed.      No current facility-administered medications for this visit.    Facility-Administered Medications Ordered in Other Visits  Medication Dose Route Frequency Provider Last Rate Last Dose    . acetaminophen (TYLENOL) tablet 1,000 mg  1,000 mg Oral Once Lloyd Huger, MD        OBJECTIVE: Vitals:   07/17/16 0933  BP: 130/83  Pulse: 80  Temp: 97.9 F (36.6 C)     Body mass index is 27.81 kg/m.    ECOG FS:0 - Asymptomatic  General: Well-developed, well-nourished, no acute distress. Eyes: Pink conjunctiva, anicteric sclera. Lungs: Clear to auscultation bilaterally. Heart: Regular rate and rhythm. No rubs, murmurs, or gallops. Abdomen: Soft, nontender, nondistended. No organomegaly noted, normoactive bowel sounds. Musculoskeletal: No edema, cyanosis, or clubbing. Neuro: Alert, answering all questions appropriately. Cranial nerves grossly intact. Skin: No rashes or petechiae noted. Psych: Normal affect.   LAB RESULTS:  Lab Results  Component Value Date   NA 133 (L) 05/21/2015   K 3.1 (L) 05/21/2015   CL 109 05/21/2015   CO2 17 (L) 05/21/2015   GLUCOSE 96 05/21/2015   BUN 16 05/21/2015   CREATININE 1.62 (H) 05/21/2015   CALCIUM 9.0 05/21/2015   PROT 7.7 05/21/2015   ALBUMIN 3.7 05/21/2015   AST 55 (H) 05/21/2015   ALT 86 (H) 05/21/2015   ALKPHOS 62 05/21/2015   BILITOT 0.8 05/21/2015   GFRNONAA 48 (L) 05/21/2015   GFRAA 55 (L) 05/21/2015    Lab Results  Component Value Date   WBC 7.0 05/21/2015   HGB 14.7 05/21/2015   HCT 43.4 05/21/2015   MCV 85.9 05/21/2015   PLT 370 05/21/2015     STUDIES: No results found.  ASSESSMENT: Fabry disease requiring Fabrazyme infusion.  PLAN:    1. Fabry disease requiring Fabrazyme infusion: Patient will require 1 mg/kg of Fabrazyme every 2 weeks indefinitely.  He will receive 1000 mg Tylenol and 25 mg Benadryl approximately 30 minutes prior to each infusion. He expressed understanding that we can only provide his infusion.  Any laboratory work or questions regarding his disease or infusions must be directed towards to his primary geneticist or endocrinologist.  Return to clinic every 2 weeks for his infusion  and then in 6 months for routine follow-up.   Patient expressed understanding and was in agreement with this plan. He also understands that He can call clinic at any time with any questions, concerns, or complaints.   PCP: Dr. Ernest Pine at [facility]:  Dr. Karenann Cai at [place], MD   07/17/2016 10:03 AM

## 2016-07-16 MED ORDER — SODIUM CHLORIDE 0.9 % IV SOLN
80.0000 mg | Freq: Once | INTRAVENOUS | Status: AC
  Administered 2016-07-17: 80 mg via INTRAVENOUS
  Filled 2016-07-16: qty 14

## 2016-07-16 MED ORDER — SODIUM CHLORIDE 0.9 % IV SOLN
Freq: Once | INTRAVENOUS | Status: AC
  Administered 2016-07-17: 10:00:00 via INTRAVENOUS
  Filled 2016-07-16: qty 1000

## 2016-07-16 MED ORDER — ACETAMINOPHEN 500 MG PO TABS
1000.0000 mg | ORAL_TABLET | Freq: Once | ORAL | Status: AC
  Administered 2016-07-17: 1000 mg via ORAL
  Filled 2016-07-16: qty 2

## 2016-07-17 ENCOUNTER — Ambulatory Visit: Payer: Medicare Other | Admitting: Oncology

## 2016-07-17 ENCOUNTER — Ambulatory Visit: Payer: Medicare Other

## 2016-07-17 ENCOUNTER — Inpatient Hospital Stay (HOSPITAL_BASED_OUTPATIENT_CLINIC_OR_DEPARTMENT_OTHER): Payer: Medicare Other | Admitting: Oncology

## 2016-07-17 ENCOUNTER — Inpatient Hospital Stay: Payer: Medicare Other

## 2016-07-17 VITALS — BP 130/83 | HR 80 | Temp 97.9°F | Ht 71.0 in | Wt 199.4 lb

## 2016-07-17 VITALS — BP 119/74 | HR 66 | Temp 97.6°F | Resp 18

## 2016-07-17 DIAGNOSIS — I129 Hypertensive chronic kidney disease with stage 1 through stage 4 chronic kidney disease, or unspecified chronic kidney disease: Secondary | ICD-10-CM | POA: Diagnosis not present

## 2016-07-17 DIAGNOSIS — R5381 Other malaise: Secondary | ICD-10-CM

## 2016-07-17 DIAGNOSIS — N182 Chronic kidney disease, stage 2 (mild): Secondary | ICD-10-CM

## 2016-07-17 DIAGNOSIS — E559 Vitamin D deficiency, unspecified: Secondary | ICD-10-CM

## 2016-07-17 DIAGNOSIS — R5383 Other fatigue: Secondary | ICD-10-CM

## 2016-07-17 DIAGNOSIS — E7521 Fabry (-Anderson) disease: Secondary | ICD-10-CM | POA: Diagnosis not present

## 2016-07-17 DIAGNOSIS — R531 Weakness: Secondary | ICD-10-CM | POA: Diagnosis not present

## 2016-07-17 DIAGNOSIS — Z7982 Long term (current) use of aspirin: Secondary | ICD-10-CM

## 2016-07-17 DIAGNOSIS — E782 Mixed hyperlipidemia: Secondary | ICD-10-CM | POA: Diagnosis not present

## 2016-07-17 DIAGNOSIS — I517 Cardiomegaly: Secondary | ICD-10-CM | POA: Diagnosis not present

## 2016-07-17 DIAGNOSIS — Z79899 Other long term (current) drug therapy: Secondary | ICD-10-CM | POA: Diagnosis not present

## 2016-07-17 DIAGNOSIS — Z880 Allergy status to penicillin: Secondary | ICD-10-CM | POA: Diagnosis not present

## 2016-07-17 NOTE — Progress Notes (Signed)
Patient here for follow up no changes since last appointment 

## 2016-07-17 NOTE — Patient Instructions (Signed)
Agalsidase Beta injection What is this medicine? AGALSIDASE BETA is used to replace an enzyme that is missing in patients with Fabry disease. It is not a cure. This medicine may be used for other purposes; ask your health care provider or pharmacist if you have questions. COMMON BRAND NAME(S): Fabrazyme What should I tell my health care provider before I take this medicine? They need to know if you have any of these conditions: -heart disease -an unusual or allergic reaction to agalsidase beta, mannitol, other medicines, foods, dyes, or preservatives -pregnant or trying to get pregnant -breast-feeding How should I use this medicine? This medicine is for infusion into a vein. It is given by a health care professional in a hospital or clinic setting. Talk to your pediatrician regarding the use of this medicine in children. Special care may be needed. Overdosage: If you think you have taken too much of this medicine contact a poison control center or emergency room at once. NOTE: This medicine is only for you. Do not share this medicine with others. What if I miss a dose? It is important not to miss your dose. Call your doctor or health care professional if you are unable to keep an appointment. What may interact with this medicine? -amiodarone -chloroquine -gentamicin -hydroxychloroquine -monobenzone This list may not describe all possible interactions. Give your health care provider a list of all the medicines, herbs, non-prescription drugs, or dietary supplements you use. Also tell them if you smoke, drink alcohol, or use illegal drugs. Some items may interact with your medicine. What should I watch for while using this medicine? Visit your doctor or health care professional for regular checks on your progress. Tell your doctor or healthcare professional if your symptoms do not start to get better or if they get worse. There is a registry for patients with Fabry disease. The registry is  used to gather information about the disease and its effects. Talk to your health care provider if you would like to join the registry. What side effects may I notice from receiving this medicine? Side effects that you should report to your doctor or health care professional as soon as possible: -allergic reactions like skin rash, itching or hives, swelling of the face, lips, or tongue -breathing problems -chest pain, tightness -depression -dizziness -fast, irregular heart beat -swelling of the arms or legs Side effects that usually do not require medical attention (report to your doctor or health care professional if they continue or are bothersome): -aches or pains -anxiety -fever or chills at the time of injection -headache -nausea, vomiting -stomach pain, upset This list may not describe all possible side effects. Call your doctor for medical advice about side effects. You may report side effects to FDA at 1-800-FDA-1088. Where should I keep my medicine? This drug is given in a hospital or clinic and will not be stored at home. NOTE: This sheet is a summary. It may not cover all possible information. If you have questions about this medicine, talk to your doctor, pharmacist, or health care provider.  2018 Elsevier/Gold Standard (2005-10-26 12:25:00)  

## 2016-07-28 MED ORDER — SODIUM CHLORIDE 0.9 % IV SOLN
Freq: Once | INTRAVENOUS | Status: AC
  Administered 2016-07-29: 09:00:00 via INTRAVENOUS
  Filled 2016-07-28: qty 1000

## 2016-07-28 MED ORDER — SODIUM CHLORIDE 0.9 % IV SOLN
80.0000 mg | Freq: Once | INTRAVENOUS | Status: AC
  Administered 2016-07-29: 80 mg via INTRAVENOUS
  Filled 2016-07-28: qty 14

## 2016-07-28 MED ORDER — ACETAMINOPHEN 500 MG PO TABS
1000.0000 mg | ORAL_TABLET | Freq: Once | ORAL | Status: AC
  Administered 2016-07-29: 1000 mg via ORAL
  Filled 2016-07-28: qty 2

## 2016-07-29 ENCOUNTER — Inpatient Hospital Stay: Payer: Medicare Other

## 2016-07-29 VITALS — BP 123/78 | HR 76 | Temp 96.6°F | Resp 18 | Wt 197.5 lb

## 2016-07-29 DIAGNOSIS — R5381 Other malaise: Secondary | ICD-10-CM | POA: Diagnosis not present

## 2016-07-29 DIAGNOSIS — I129 Hypertensive chronic kidney disease with stage 1 through stage 4 chronic kidney disease, or unspecified chronic kidney disease: Secondary | ICD-10-CM | POA: Diagnosis not present

## 2016-07-29 DIAGNOSIS — E7521 Fabry (-Anderson) disease: Secondary | ICD-10-CM

## 2016-07-29 DIAGNOSIS — R5383 Other fatigue: Secondary | ICD-10-CM | POA: Diagnosis not present

## 2016-07-29 DIAGNOSIS — N182 Chronic kidney disease, stage 2 (mild): Secondary | ICD-10-CM | POA: Diagnosis not present

## 2016-07-29 DIAGNOSIS — R531 Weakness: Secondary | ICD-10-CM | POA: Diagnosis not present

## 2016-07-29 NOTE — Patient Instructions (Signed)
Agalsidase Beta injection What is this medicine? AGALSIDASE BETA is used to replace an enzyme that is missing in patients with Fabry disease. It is not a cure. This medicine may be used for other purposes; ask your health care provider or pharmacist if you have questions. COMMON BRAND NAME(S): Fabrazyme What should I tell my health care provider before I take this medicine? They need to know if you have any of these conditions: -heart disease -an unusual or allergic reaction to agalsidase beta, mannitol, other medicines, foods, dyes, or preservatives -pregnant or trying to get pregnant -breast-feeding How should I use this medicine? This medicine is for infusion into a vein. It is given by a health care professional in a hospital or clinic setting. Talk to your pediatrician regarding the use of this medicine in children. Special care may be needed. Overdosage: If you think you have taken too much of this medicine contact a poison control center or emergency room at once. NOTE: This medicine is only for you. Do not share this medicine with others. What if I miss a dose? It is important not to miss your dose. Call your doctor or health care professional if you are unable to keep an appointment. What may interact with this medicine? -amiodarone -chloroquine -gentamicin -hydroxychloroquine -monobenzone This list may not describe all possible interactions. Give your health care provider a list of all the medicines, herbs, non-prescription drugs, or dietary supplements you use. Also tell them if you smoke, drink alcohol, or use illegal drugs. Some items may interact with your medicine. What should I watch for while using this medicine? Visit your doctor or health care professional for regular checks on your progress. Tell your doctor or healthcare professional if your symptoms do not start to get better or if they get worse. There is a registry for patients with Fabry disease. The registry is  used to gather information about the disease and its effects. Talk to your health care provider if you would like to join the registry. What side effects may I notice from receiving this medicine? Side effects that you should report to your doctor or health care professional as soon as possible: -allergic reactions like skin rash, itching or hives, swelling of the face, lips, or tongue -breathing problems -chest pain, tightness -depression -dizziness -fast, irregular heart beat -swelling of the arms or legs Side effects that usually do not require medical attention (report to your doctor or health care professional if they continue or are bothersome): -aches or pains -anxiety -fever or chills at the time of injection -headache -nausea, vomiting -stomach pain, upset This list may not describe all possible side effects. Call your doctor for medical advice about side effects. You may report side effects to FDA at 1-800-FDA-1088. Where should I keep my medicine? This drug is given in a hospital or clinic and will not be stored at home. NOTE: This sheet is a summary. It may not cover all possible information. If you have questions about this medicine, talk to your doctor, pharmacist, or health care provider.  2018 Elsevier/Gold Standard (2005-10-26 12:25:00)  

## 2016-08-12 ENCOUNTER — Inpatient Hospital Stay: Payer: Medicare Other | Attending: Oncology

## 2016-08-12 ENCOUNTER — Inpatient Hospital Stay: Payer: Medicare Other

## 2016-08-12 VITALS — BP 98/60 | HR 65 | Temp 96.7°F | Resp 18 | Wt 195.1 lb

## 2016-08-12 DIAGNOSIS — Z79899 Other long term (current) drug therapy: Secondary | ICD-10-CM | POA: Insufficient documentation

## 2016-08-12 DIAGNOSIS — E7521 Fabry (-Anderson) disease: Secondary | ICD-10-CM

## 2016-08-12 MED ORDER — SODIUM CHLORIDE 0.9 % IV SOLN
80.0000 mg | Freq: Once | INTRAVENOUS | Status: AC
  Administered 2016-08-12: 80 mg via INTRAVENOUS
  Filled 2016-08-12: qty 2

## 2016-08-12 MED ORDER — SODIUM CHLORIDE 0.9 % IV SOLN
Freq: Once | INTRAVENOUS | Status: AC
  Administered 2016-08-12: 10:00:00 via INTRAVENOUS
  Filled 2016-08-12: qty 1000

## 2016-08-12 MED ORDER — ACETAMINOPHEN 500 MG PO TABS
1000.0000 mg | ORAL_TABLET | Freq: Once | ORAL | Status: AC
  Administered 2016-08-12: 1000 mg via ORAL
  Filled 2016-08-12: qty 2

## 2016-08-27 ENCOUNTER — Inpatient Hospital Stay: Payer: Medicare Other

## 2016-08-27 MED ORDER — SODIUM CHLORIDE 0.9 % IV SOLN
80.0000 mg | Freq: Once | INTRAVENOUS | Status: AC
  Administered 2016-08-28: 80 mg via INTRAVENOUS
  Filled 2016-08-27: qty 2

## 2016-08-27 MED ORDER — SODIUM CHLORIDE 0.9 % IV SOLN
Freq: Once | INTRAVENOUS | Status: AC
  Administered 2016-08-28: 10:00:00 via INTRAVENOUS
  Filled 2016-08-27: qty 1000

## 2016-08-27 MED ORDER — ACETAMINOPHEN 500 MG PO TABS
1000.0000 mg | ORAL_TABLET | Freq: Once | ORAL | Status: AC
  Administered 2016-08-28: 1000 mg via ORAL
  Filled 2016-08-27: qty 2

## 2016-08-28 ENCOUNTER — Inpatient Hospital Stay: Payer: Medicare Other

## 2016-08-28 VITALS — BP 110/72 | HR 63 | Temp 97.2°F | Resp 18 | Wt 195.5 lb

## 2016-08-28 DIAGNOSIS — E7521 Fabry (-Anderson) disease: Secondary | ICD-10-CM | POA: Diagnosis not present

## 2016-08-28 DIAGNOSIS — Z79899 Other long term (current) drug therapy: Secondary | ICD-10-CM | POA: Diagnosis not present

## 2016-09-08 MED ORDER — AGALSIDASE BETA 5 MG IV SOLR
80.0000 mg | Freq: Once | INTRAVENOUS | Status: AC
  Administered 2016-09-09: 80 mg via INTRAVENOUS
  Filled 2016-09-08: qty 14

## 2016-09-08 MED ORDER — ACETAMINOPHEN 500 MG PO TABS
1000.0000 mg | ORAL_TABLET | Freq: Once | ORAL | Status: AC
  Administered 2016-09-09: 1000 mg via ORAL

## 2016-09-08 MED ORDER — SODIUM CHLORIDE 0.9 % IV SOLN
Freq: Once | INTRAVENOUS | Status: AC
  Administered 2016-09-09: 10:00:00 via INTRAVENOUS
  Filled 2016-09-08: qty 1000

## 2016-09-09 ENCOUNTER — Inpatient Hospital Stay: Payer: Medicare Other

## 2016-09-09 ENCOUNTER — Inpatient Hospital Stay: Payer: Medicare Other | Attending: Oncology

## 2016-09-09 VITALS — BP 122/70 | HR 68 | Temp 97.4°F | Resp 18

## 2016-09-09 DIAGNOSIS — E7521 Fabry (-Anderson) disease: Secondary | ICD-10-CM | POA: Diagnosis not present

## 2016-09-09 DIAGNOSIS — Z79899 Other long term (current) drug therapy: Secondary | ICD-10-CM | POA: Insufficient documentation

## 2016-09-22 MED ORDER — SODIUM CHLORIDE 0.9 % IV SOLN
80.0000 mg | Freq: Once | INTRAVENOUS | Status: AC
  Administered 2016-09-23: 80 mg via INTRAVENOUS
  Filled 2016-09-22: qty 14

## 2016-09-22 MED ORDER — ACETAMINOPHEN 500 MG PO TABS
1000.0000 mg | ORAL_TABLET | Freq: Once | ORAL | Status: AC
  Administered 2016-09-23: 1000 mg via ORAL

## 2016-09-22 MED ORDER — SODIUM CHLORIDE 0.9 % IV SOLN
Freq: Once | INTRAVENOUS | Status: AC
  Administered 2016-09-23: 10:00:00 via INTRAVENOUS
  Filled 2016-09-22: qty 1000

## 2016-09-23 ENCOUNTER — Inpatient Hospital Stay: Payer: Medicare Other

## 2016-09-23 VITALS — BP 128/74 | HR 70 | Temp 97.0°F | Resp 18 | Wt 197.8 lb

## 2016-09-23 DIAGNOSIS — E7521 Fabry (-Anderson) disease: Secondary | ICD-10-CM

## 2016-09-23 DIAGNOSIS — Z79899 Other long term (current) drug therapy: Secondary | ICD-10-CM | POA: Diagnosis not present

## 2016-10-06 MED ORDER — ACETAMINOPHEN 500 MG PO TABS
1000.0000 mg | ORAL_TABLET | Freq: Once | ORAL | Status: AC
  Administered 2016-10-07: 1000 mg via ORAL

## 2016-10-06 MED ORDER — SODIUM CHLORIDE 0.9 % IV SOLN
80.0000 mg | Freq: Once | INTRAVENOUS | Status: AC
  Administered 2016-10-07: 80 mg via INTRAVENOUS
  Filled 2016-10-06: qty 2

## 2016-10-06 MED ORDER — SODIUM CHLORIDE 0.9 % IV SOLN
Freq: Once | INTRAVENOUS | Status: AC
  Administered 2016-10-07: 10:00:00 via INTRAVENOUS
  Filled 2016-10-06: qty 1000

## 2016-10-07 ENCOUNTER — Inpatient Hospital Stay: Payer: Medicare Other | Attending: Oncology

## 2016-10-07 ENCOUNTER — Inpatient Hospital Stay: Payer: Medicare Other

## 2016-10-07 VITALS — BP 105/67 | HR 54 | Temp 97.5°F | Resp 18

## 2016-10-07 DIAGNOSIS — Z79899 Other long term (current) drug therapy: Secondary | ICD-10-CM | POA: Insufficient documentation

## 2016-10-07 DIAGNOSIS — E7521 Fabry (-Anderson) disease: Secondary | ICD-10-CM | POA: Diagnosis not present

## 2016-10-07 MED ORDER — ACETAMINOPHEN 500 MG PO TABS
ORAL_TABLET | ORAL | Status: AC
  Filled 2016-10-07: qty 2

## 2016-10-20 MED ORDER — SODIUM CHLORIDE 0.9 % IV SOLN
80.0000 mg | Freq: Once | INTRAVENOUS | Status: AC
  Administered 2016-10-21: 80 mg via INTRAVENOUS
  Filled 2016-10-20: qty 14

## 2016-10-21 ENCOUNTER — Inpatient Hospital Stay: Payer: Medicare Other

## 2016-10-21 VITALS — BP 110/70 | HR 65 | Temp 97.6°F | Resp 18

## 2016-10-21 DIAGNOSIS — E7521 Fabry (-Anderson) disease: Secondary | ICD-10-CM | POA: Diagnosis not present

## 2016-10-21 DIAGNOSIS — Z79899 Other long term (current) drug therapy: Secondary | ICD-10-CM | POA: Diagnosis not present

## 2016-10-21 MED ORDER — SODIUM CHLORIDE 0.9 % IV SOLN
Freq: Once | INTRAVENOUS | Status: AC
  Administered 2016-10-21: 10:00:00 via INTRAVENOUS
  Filled 2016-10-21: qty 1000

## 2016-10-21 MED ORDER — ACETAMINOPHEN 500 MG PO TABS
1000.0000 mg | ORAL_TABLET | Freq: Once | ORAL | Status: AC
  Administered 2016-10-21: 1000 mg via ORAL
  Filled 2016-10-21: qty 2

## 2016-11-04 ENCOUNTER — Inpatient Hospital Stay: Payer: Medicare Other | Attending: Oncology

## 2016-11-04 ENCOUNTER — Inpatient Hospital Stay: Payer: Medicare Other

## 2016-11-04 VITALS — BP 122/74 | HR 57 | Temp 98.1°F | Resp 18 | Wt 197.6 lb

## 2016-11-04 DIAGNOSIS — E7521 Fabry (-Anderson) disease: Secondary | ICD-10-CM

## 2016-11-04 DIAGNOSIS — Z79899 Other long term (current) drug therapy: Secondary | ICD-10-CM | POA: Insufficient documentation

## 2016-11-04 MED ORDER — ACETAMINOPHEN 500 MG PO TABS
1000.0000 mg | ORAL_TABLET | Freq: Once | ORAL | Status: AC
  Administered 2016-11-04: 1000 mg via ORAL
  Filled 2016-11-04: qty 2

## 2016-11-04 MED ORDER — SODIUM CHLORIDE 0.9 % IV SOLN
Freq: Once | INTRAVENOUS | Status: AC
  Administered 2016-11-04: 10:00:00 via INTRAVENOUS
  Filled 2016-11-04: qty 1000

## 2016-11-04 MED ORDER — SODIUM CHLORIDE 0.9 % IV SOLN
80.0000 mg | Freq: Once | INTRAVENOUS | Status: AC
  Administered 2016-11-04: 80 mg via INTRAVENOUS
  Filled 2016-11-04: qty 14

## 2016-11-04 NOTE — Patient Instructions (Signed)
Agalsidase Beta injection What is this medicine? AGALSIDASE BETA is used to replace an enzyme that is missing in patients with Fabry disease. It is not a cure. This medicine may be used for other purposes; ask your health care provider or pharmacist if you have questions. COMMON BRAND NAME(S): Fabrazyme What should I tell my health care provider before I take this medicine? They need to know if you have any of these conditions: -heart disease -an unusual or allergic reaction to agalsidase beta, mannitol, other medicines, foods, dyes, or preservatives -pregnant or trying to get pregnant -breast-feeding How should I use this medicine? This medicine is for infusion into a vein. It is given by a health care professional in a hospital or clinic setting. Talk to your pediatrician regarding the use of this medicine in children. Special care may be needed. Overdosage: If you think you have taken too much of this medicine contact a poison control center or emergency room at once. NOTE: This medicine is only for you. Do not share this medicine with others. What if I miss a dose? It is important not to miss your dose. Call your doctor or health care professional if you are unable to keep an appointment. What may interact with this medicine? -amiodarone -chloroquine -gentamicin -hydroxychloroquine -monobenzone This list may not describe all possible interactions. Give your health care provider a list of all the medicines, herbs, non-prescription drugs, or dietary supplements you use. Also tell them if you smoke, drink alcohol, or use illegal drugs. Some items may interact with your medicine. What should I watch for while using this medicine? Visit your doctor or health care professional for regular checks on your progress. Tell your doctor or healthcare professional if your symptoms do not start to get better or if they get worse. There is a registry for patients with Fabry disease. The registry is  used to gather information about the disease and its effects. Talk to your health care provider if you would like to join the registry. What side effects may I notice from receiving this medicine? Side effects that you should report to your doctor or health care professional as soon as possible: -allergic reactions like skin rash, itching or hives, swelling of the face, lips, or tongue -breathing problems -chest pain, tightness -depression -dizziness -fast, irregular heart beat -swelling of the arms or legs Side effects that usually do not require medical attention (report to your doctor or health care professional if they continue or are bothersome): -aches or pains -anxiety -fever or chills at the time of injection -headache -nausea, vomiting -stomach pain, upset This list may not describe all possible side effects. Call your doctor for medical advice about side effects. You may report side effects to FDA at 1-800-FDA-1088. Where should I keep my medicine? This drug is given in a hospital or clinic and will not be stored at home. NOTE: This sheet is a summary. It may not cover all possible information. If you have questions about this medicine, talk to your doctor, pharmacist, or health care provider.  2018 Elsevier/Gold Standard (2005-10-26 12:25:00)  

## 2016-11-18 ENCOUNTER — Inpatient Hospital Stay: Payer: Medicare Other

## 2016-11-18 VITALS — BP 113/76 | HR 68 | Temp 96.9°F | Resp 18 | Wt 198.0 lb

## 2016-11-18 DIAGNOSIS — E7521 Fabry (-Anderson) disease: Secondary | ICD-10-CM | POA: Diagnosis not present

## 2016-11-18 DIAGNOSIS — Z79899 Other long term (current) drug therapy: Secondary | ICD-10-CM | POA: Diagnosis not present

## 2016-11-18 MED ORDER — ACETAMINOPHEN 500 MG PO TABS
ORAL_TABLET | ORAL | Status: AC
  Filled 2016-11-18: qty 2

## 2016-11-18 MED ORDER — ACETAMINOPHEN 500 MG PO TABS
1000.0000 mg | ORAL_TABLET | Freq: Once | ORAL | Status: AC
  Administered 2016-11-18: 1000 mg via ORAL

## 2016-11-18 MED ORDER — SODIUM CHLORIDE 0.9 % IV SOLN
Freq: Once | INTRAVENOUS | Status: AC
  Administered 2016-11-18: 10:00:00 via INTRAVENOUS
  Filled 2016-11-18: qty 1000

## 2016-11-18 MED ORDER — SODIUM CHLORIDE 0.9 % IV SOLN
80.0000 mg | Freq: Once | INTRAVENOUS | Status: AC
  Administered 2016-11-18: 80 mg via INTRAVENOUS
  Filled 2016-11-18: qty 2

## 2016-11-19 DIAGNOSIS — N189 Chronic kidney disease, unspecified: Secondary | ICD-10-CM | POA: Diagnosis not present

## 2016-11-19 DIAGNOSIS — Z114 Encounter for screening for human immunodeficiency virus [HIV]: Secondary | ICD-10-CM | POA: Diagnosis not present

## 2016-11-19 DIAGNOSIS — E7521 Fabry (-Anderson) disease: Secondary | ICD-10-CM | POA: Diagnosis not present

## 2016-11-19 DIAGNOSIS — Z1159 Encounter for screening for other viral diseases: Secondary | ICD-10-CM | POA: Diagnosis not present

## 2016-11-19 DIAGNOSIS — I517 Cardiomegaly: Secondary | ICD-10-CM | POA: Diagnosis not present

## 2016-11-19 DIAGNOSIS — Z578 Occupational exposure to other risk factors: Secondary | ICD-10-CM | POA: Diagnosis not present

## 2016-12-02 ENCOUNTER — Inpatient Hospital Stay: Payer: Medicare Other

## 2016-12-02 ENCOUNTER — Inpatient Hospital Stay: Payer: Medicare Other | Attending: Oncology

## 2016-12-02 VITALS — BP 130/76 | HR 49 | Temp 97.5°F | Resp 18 | Wt 198.4 lb

## 2016-12-02 DIAGNOSIS — Z79899 Other long term (current) drug therapy: Secondary | ICD-10-CM | POA: Diagnosis not present

## 2016-12-02 DIAGNOSIS — E7521 Fabry (-Anderson) disease: Secondary | ICD-10-CM | POA: Diagnosis not present

## 2016-12-02 MED ORDER — SODIUM CHLORIDE 0.9 % IV SOLN
Freq: Once | INTRAVENOUS | Status: AC
  Administered 2016-12-02: 10:00:00 via INTRAVENOUS
  Filled 2016-12-02: qty 1000

## 2016-12-02 MED ORDER — SODIUM CHLORIDE 0.9 % IV SOLN
80.0000 mg | Freq: Once | INTRAVENOUS | Status: AC
  Administered 2016-12-02: 80 mg via INTRAVENOUS
  Filled 2016-12-02: qty 2

## 2016-12-02 MED ORDER — ACETAMINOPHEN 500 MG PO TABS
1000.0000 mg | ORAL_TABLET | Freq: Once | ORAL | Status: AC
  Administered 2016-12-02: 1000 mg via ORAL
  Filled 2016-12-02: qty 2

## 2016-12-16 ENCOUNTER — Inpatient Hospital Stay: Payer: Medicare Other

## 2016-12-16 VITALS — BP 120/70 | HR 62 | Temp 97.0°F | Resp 18 | Wt 202.6 lb

## 2016-12-16 DIAGNOSIS — E7521 Fabry (-Anderson) disease: Secondary | ICD-10-CM

## 2016-12-16 DIAGNOSIS — Z79899 Other long term (current) drug therapy: Secondary | ICD-10-CM | POA: Diagnosis not present

## 2016-12-16 MED ORDER — SODIUM CHLORIDE 0.9 % IV SOLN
Freq: Once | INTRAVENOUS | Status: AC
  Administered 2016-12-16: 10:00:00 via INTRAVENOUS
  Filled 2016-12-16: qty 1000

## 2016-12-16 MED ORDER — SODIUM CHLORIDE 0.9 % IV SOLN
80.0000 mg | Freq: Once | INTRAVENOUS | Status: AC
  Administered 2016-12-16: 80 mg via INTRAVENOUS
  Filled 2016-12-16: qty 14

## 2016-12-16 MED ORDER — ACETAMINOPHEN 500 MG PO TABS
1000.0000 mg | ORAL_TABLET | Freq: Once | ORAL | Status: AC
  Administered 2016-12-16: 1000 mg via ORAL
  Filled 2016-12-16: qty 2

## 2016-12-17 DIAGNOSIS — Z23 Encounter for immunization: Secondary | ICD-10-CM | POA: Diagnosis not present

## 2016-12-17 DIAGNOSIS — R809 Proteinuria, unspecified: Secondary | ICD-10-CM | POA: Diagnosis not present

## 2016-12-17 DIAGNOSIS — N183 Chronic kidney disease, stage 3 (moderate): Secondary | ICD-10-CM | POA: Diagnosis not present

## 2016-12-17 DIAGNOSIS — E7521 Fabry (-Anderson) disease: Secondary | ICD-10-CM | POA: Diagnosis not present

## 2016-12-31 MED ORDER — SODIUM CHLORIDE 0.9 % IV SOLN
80.0000 mg | Freq: Once | INTRAVENOUS | Status: AC
  Administered 2017-01-01: 80 mg via INTRAVENOUS
  Filled 2016-12-31: qty 16

## 2017-01-01 ENCOUNTER — Inpatient Hospital Stay: Payer: Medicare Other | Attending: Oncology | Admitting: Oncology

## 2017-01-01 ENCOUNTER — Inpatient Hospital Stay: Payer: Medicare Other

## 2017-01-01 VITALS — BP 120/65 | HR 70 | Temp 98.0°F | Resp 18

## 2017-01-01 VITALS — BP 144/86 | HR 81 | Resp 20 | Wt 200.6 lb

## 2017-01-01 DIAGNOSIS — Z79899 Other long term (current) drug therapy: Secondary | ICD-10-CM | POA: Diagnosis not present

## 2017-01-01 DIAGNOSIS — E7521 Fabry (-Anderson) disease: Secondary | ICD-10-CM

## 2017-01-01 DIAGNOSIS — E782 Mixed hyperlipidemia: Secondary | ICD-10-CM

## 2017-01-01 DIAGNOSIS — Z880 Allergy status to penicillin: Secondary | ICD-10-CM | POA: Diagnosis not present

## 2017-01-01 DIAGNOSIS — I129 Hypertensive chronic kidney disease with stage 1 through stage 4 chronic kidney disease, or unspecified chronic kidney disease: Secondary | ICD-10-CM | POA: Insufficient documentation

## 2017-01-01 DIAGNOSIS — R531 Weakness: Secondary | ICD-10-CM | POA: Insufficient documentation

## 2017-01-01 DIAGNOSIS — N182 Chronic kidney disease, stage 2 (mild): Secondary | ICD-10-CM | POA: Insufficient documentation

## 2017-01-01 DIAGNOSIS — R5383 Other fatigue: Secondary | ICD-10-CM | POA: Insufficient documentation

## 2017-01-01 DIAGNOSIS — E559 Vitamin D deficiency, unspecified: Secondary | ICD-10-CM

## 2017-01-01 DIAGNOSIS — Z7982 Long term (current) use of aspirin: Secondary | ICD-10-CM | POA: Insufficient documentation

## 2017-01-01 DIAGNOSIS — R600 Localized edema: Secondary | ICD-10-CM | POA: Insufficient documentation

## 2017-01-01 DIAGNOSIS — R5381 Other malaise: Secondary | ICD-10-CM | POA: Diagnosis not present

## 2017-01-01 MED ORDER — ACETAMINOPHEN 500 MG PO TABS
1000.0000 mg | ORAL_TABLET | Freq: Once | ORAL | Status: AC
  Administered 2017-01-01: 1000 mg via ORAL
  Filled 2017-01-01: qty 2

## 2017-01-01 MED ORDER — SODIUM CHLORIDE 0.9 % IV SOLN
Freq: Once | INTRAVENOUS | Status: AC
  Administered 2017-01-01: 11:00:00 via INTRAVENOUS
  Filled 2017-01-01: qty 1000

## 2017-01-01 NOTE — Progress Notes (Signed)
Patient denies any concerns today.  

## 2017-01-01 NOTE — Progress Notes (Signed)
[place]  Telephone:(336) [phone] [phone]) [phone]  ID: Jeffery Hughes OB: 05-26-1965  MR#: [id number]  IZT#:[id number]  Patient Care Team: [facility] as PCP - General (Family Medicine)  CHIEF COMPLAINT: Fabry disease requiring Fabrazyme infusion.  INTERVAL HISTORY: Patient returns to clinic today for further evaluation and continuation of Fabrazyme infusion every 2 weeks. He continues to notice increased weakness and fatigue 1-2 days prior to his infusion and notes chronic left leg swelling relived with compression stocking and elevation. Otherwise her feels well and is asymptomatic.He has no other neurologic complaints.  He denies any recent fevers or illnesses.  He has a good appetite and denies weight loss.  He denies any pain.  He has no chest pain or shortness of breath.  He denies any nausea, vomiting, constipation, or diarrhea.  He has no urinary complaints.  Patient feels at his baseline and offers no specific complaints today.   REVIEW OF SYSTEMS:   Review of Systems  Constitutional: Positive for malaise/fatigue. Negative for fever and weight loss.  Respiratory: Negative.  Negative for cough and shortness of breath.   Cardiovascular: Positive for leg swelling. Negative for chest pain.       Left leg only  Gastrointestinal: Negative.  Negative for abdominal pain.  Genitourinary: Negative.   Musculoskeletal: Negative.  Negative for myalgias.  Skin: Negative.  Negative for rash.  Neurological: Positive for weakness. Negative for sensory change and focal weakness.  Psychiatric/Behavioral: Negative.  Negative for depression. The patient is not nervous/anxious.     As per HPI. Otherwise, a complete review of systems is negative.  PAST MEDICAL HISTORY: Past Medical History:  Diagnosis Date  . CKD (chronic kidney disease), stage II    a. 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 Fabry disease ([place])    a. initially diagnosed in [place], [place] in ~  2003 to 2004, previously treated with Fabrazyme  . Fabry disease ([place])   . Hypertension   . LVH (left ventricular hypertrophy)   . Mixed hyperlipidemia   . Vitamin D deficiency     PAST SURGICAL HISTORY: Past Surgical History:  Procedure Laterality Date  . KNEE SURGERY Left    20+ years ago, 6+ years ago  . NO PAST SURGERIES      FAMILY HISTORY: Brother with Fabry's disease, otherwise negative.     ADVANCED DIRECTIVES:    HEALTH MAINTENANCE: Social History  Substance Use Topics  . Smoking status: Never Smoker  . Smokeless tobacco: Never Used  . Alcohol use No     Colonoscopy:  PAP:  Bone density:  Lipid panel:  Allergies  Allergen Reactions  . Other Other (See Comments)    Nephrologist recommended avoiding contrast.  . Penicillins Other (See Comments)    unknown    Current Outpatient Prescriptions  Medication Sig Dispense Refill  . albuterol (PROVENTIL HFA;VENTOLIN HFA) 108 (90 BASE) MCG/ACT inhaler Inhale 2 puffs into the lungs every 6 (six) hours as needed for wheezing or shortness of breath. 1 Inhaler 2  . aspirin 81 MG tablet Take 81 mg by mouth daily.    . cyanocobalamin 1000 MCG tablet Take 100 mcg by mouth daily.    . diphenoxylate-atropine (LOMOTIL) 2.5-0.025 MG per tablet Take 2 tablets by mouth 4 (four) times daily as needed.     . furosemide (LASIX) 40 MG tablet Take 40 mg by mouth daily.    Marland Kitchen guaiFENesin-codeine 100-10 MG/5ML syrup Take 5 mLs by mouth every 4 (four) hours as needed.  120 mL 0  . Multiple Vitamin (MULTIVITAMIN) tablet Take 1 tablet by mouth daily.    . potassium chloride SA (K-DUR,KLOR-CON) 20 MEQ tablet Take 20 mEq by mouth 2 (two) times daily.    . valsartan (DIOVAN) 160 MG tablet Take 160 mg by mouth daily. Pt takes 1 in the morning and 1/2 tablet every evening.    . loratadine (CLARITIN) 10 MG tablet Take 10 mg by mouth daily as needed.      No current facility-administered medications for this visit.    Facility-Administered  Medications Ordered in Other Visits  Medication Dose Route Frequency Provider Last Rate Last Dose  . acetaminophen (TYLENOL) tablet 1,000 mg  1,000 mg Oral Once Lloyd Huger, MD        OBJECTIVE: Vitals:   01/01/17 1021  BP: (!) 144/86  Pulse: 81  Resp: 20     Body mass index is 27.98 kg/m.    ECOG FS:0 - Asymptomatic  General: Well-developed, well-nourished, no acute distress. Eyes: Pink conjunctiva, anicteric sclera. Lungs: Clear to auscultation bilaterally. Heart: Regular rate and rhythm. No rubs, murmurs, or gallops. Abdomen: Soft, nontender, nondistended. No organomegaly noted, normoactive bowel sounds. Musculoskeletal: No edema, cyanosis, or clubbing. Neuro: Alert, answering all questions appropriately. Cranial nerves grossly intact. Skin: No rashes or petechiae noted. Psych: Normal affect.   LAB RESULTS:  Lab Results  Component Value Date   NA 133 (L) 05/21/2015   K 3.1 (L) 05/21/2015   CL 109 05/21/2015   CO2 17 (L) 05/21/2015   GLUCOSE 96 05/21/2015   BUN 16 05/21/2015   CREATININE 1.62 (H) 05/21/2015   CALCIUM 9.0 05/21/2015   PROT 7.7 05/21/2015   ALBUMIN 3.7 05/21/2015   AST 55 (H) 05/21/2015   ALT 86 (H) 05/21/2015   ALKPHOS 62 05/21/2015   BILITOT 0.8 05/21/2015   GFRNONAA 48 (L) 05/21/2015   GFRAA 55 (L) 05/21/2015    Lab Results  Component Value Date   WBC 7.0 05/21/2015   HGB 14.7 05/21/2015   HCT 43.4 05/21/2015   MCV 85.9 05/21/2015   PLT 370 05/21/2015     STUDIES: No results found.  ASSESSMENT: Fabry disease requiring Fabrazyme infusion.  PLAN:    1. Fabry disease requiring Fabrazyme infusion: Patient will require 1 mg/kg of Fabrazyme every 2 weeks indefinitely.  He will receive 1000 mg Tylenol and 25 mg Benadryl approximately 30 minutes prior to each infusion. He expressed understanding that we can only provide his infusion.  Any laboratory work or questions regarding his disease or infusions must be directed towards to his  primary geneticist or endocrinologist. Proceed with infusion today. Return to clinic every 2 weeks for his infusion and then in 6 months for routine follow-up.   Patient expressed understanding and was in agreement with this plan. He also understands that He can call clinic at any time with any questions, concerns, or complaints.   PCP: Dr. Ernest Pine at [facility] Geneticist:  Dr. Karenann Cai at [facility] Nephrologist: Dr. Thalia Party, NP   01/01/2017 11:55 AM

## 2017-01-05 DIAGNOSIS — E7521 Fabry (-Anderson) disease: Secondary | ICD-10-CM | POA: Diagnosis not present

## 2017-01-05 DIAGNOSIS — I517 Cardiomegaly: Secondary | ICD-10-CM | POA: Diagnosis not present

## 2017-01-07 MED ORDER — FENTANYL CITRATE (PF) 100 MCG/2ML IJ SOLN
INTRAMUSCULAR | Status: AC
  Filled 2017-01-07: qty 4

## 2017-01-07 MED ORDER — MIDAZOLAM HCL 5 MG/5ML IJ SOLN
INTRAMUSCULAR | Status: AC
  Filled 2017-01-07: qty 5

## 2017-01-18 ENCOUNTER — Inpatient Hospital Stay: Payer: Medicare Other

## 2017-01-18 VITALS — BP 119/76 | HR 60 | Temp 96.8°F | Resp 20 | Wt 201.8 lb

## 2017-01-18 DIAGNOSIS — E7521 Fabry (-Anderson) disease: Secondary | ICD-10-CM | POA: Diagnosis not present

## 2017-01-18 DIAGNOSIS — R531 Weakness: Secondary | ICD-10-CM | POA: Diagnosis not present

## 2017-01-18 DIAGNOSIS — R5381 Other malaise: Secondary | ICD-10-CM | POA: Diagnosis not present

## 2017-01-18 DIAGNOSIS — R5383 Other fatigue: Secondary | ICD-10-CM | POA: Diagnosis not present

## 2017-01-18 DIAGNOSIS — R600 Localized edema: Secondary | ICD-10-CM | POA: Diagnosis not present

## 2017-01-18 DIAGNOSIS — I129 Hypertensive chronic kidney disease with stage 1 through stage 4 chronic kidney disease, or unspecified chronic kidney disease: Secondary | ICD-10-CM | POA: Diagnosis not present

## 2017-01-18 MED ORDER — SODIUM CHLORIDE 0.9 % IV SOLN
80.0000 mg | Freq: Once | INTRAVENOUS | Status: AC
  Administered 2017-01-18: 80 mg via INTRAVENOUS
  Filled 2017-01-18: qty 14

## 2017-01-18 MED ORDER — SODIUM CHLORIDE 0.9 % IV SOLN
Freq: Once | INTRAVENOUS | Status: AC
  Administered 2017-01-18: 10:00:00 via INTRAVENOUS
  Filled 2017-01-18: qty 1000

## 2017-01-18 MED ORDER — ACETAMINOPHEN 500 MG PO TABS
1000.0000 mg | ORAL_TABLET | Freq: Once | ORAL | Status: AC
  Administered 2017-01-18: 1000 mg via ORAL
  Filled 2017-01-18: qty 2

## 2017-01-20 ENCOUNTER — Inpatient Hospital Stay: Payer: Medicare Other

## 2017-02-03 ENCOUNTER — Inpatient Hospital Stay: Payer: Medicare Other | Attending: Oncology

## 2017-02-03 VITALS — BP 124/76 | HR 59 | Temp 97.2°F | Resp 20 | Wt 202.8 lb

## 2017-02-03 DIAGNOSIS — E7521 Fabry (-Anderson) disease: Secondary | ICD-10-CM | POA: Diagnosis not present

## 2017-02-03 DIAGNOSIS — Z79899 Other long term (current) drug therapy: Secondary | ICD-10-CM | POA: Insufficient documentation

## 2017-02-03 MED ORDER — SODIUM CHLORIDE 0.9 % IV SOLN
80.0000 mg | Freq: Once | INTRAVENOUS | Status: AC
  Administered 2017-02-03: 80 mg via INTRAVENOUS
  Filled 2017-02-03: qty 14

## 2017-02-03 MED ORDER — SODIUM CHLORIDE 0.9 % IV SOLN
Freq: Once | INTRAVENOUS | Status: AC
  Administered 2017-02-03: 10:00:00 via INTRAVENOUS
  Filled 2017-02-03: qty 1000

## 2017-02-03 MED ORDER — ACETAMINOPHEN 500 MG PO TABS
ORAL_TABLET | ORAL | Status: AC
  Filled 2017-02-03: qty 2

## 2017-02-03 MED ORDER — ACETAMINOPHEN 500 MG PO TABS
1000.0000 mg | ORAL_TABLET | Freq: Once | ORAL | Status: AC
  Administered 2017-02-03: 1000 mg via ORAL

## 2017-02-03 NOTE — Patient Instructions (Signed)
Agalsidase Beta injection What is this medicine? AGALSIDASE BETA is used to replace an enzyme that is missing in patients with Fabry disease. It is not a cure. This medicine may be used for other purposes; ask your health care provider or pharmacist if you have questions. COMMON BRAND NAME(S): Fabrazyme What should I tell my health care provider before I take this medicine? They need to know if you have any of these conditions: -heart disease -an unusual or allergic reaction to agalsidase beta, mannitol, other medicines, foods, dyes, or preservatives -pregnant or trying to get pregnant -breast-feeding How should I use this medicine? This medicine is for infusion into a vein. It is given by a health care professional in a hospital or clinic setting. Talk to your pediatrician regarding the use of this medicine in children. Special care may be needed. Overdosage: If you think you have taken too much of this medicine contact a poison control center or emergency room at once. NOTE: This medicine is only for you. Do not share this medicine with others. What if I miss a dose? It is important not to miss your dose. Call your doctor or health care professional if you are unable to keep an appointment. What may interact with this medicine? -amiodarone -chloroquine -gentamicin -hydroxychloroquine -monobenzone This list may not describe all possible interactions. Give your health care provider a list of all the medicines, herbs, non-prescription drugs, or dietary supplements you use. Also tell them if you smoke, drink alcohol, or use illegal drugs. Some items may interact with your medicine. What should I watch for while using this medicine? Visit your doctor or health care professional for regular checks on your progress. Tell your doctor or healthcare professional if your symptoms do not start to get better or if they get worse. There is a registry for patients with Fabry disease. The registry is  used to gather information about the disease and its effects. Talk to your health care provider if you would like to join the registry. What side effects may I notice from receiving this medicine? Side effects that you should report to your doctor or health care professional as soon as possible: -allergic reactions like skin rash, itching or hives, swelling of the face, lips, or tongue -breathing problems -chest pain, tightness -depression -dizziness -fast, irregular heart beat -swelling of the arms or legs Side effects that usually do not require medical attention (report to your doctor or health care professional if they continue or are bothersome): -aches or pains -anxiety -fever or chills at the time of injection -headache -nausea, vomiting -stomach pain, upset This list may not describe all possible side effects. Call your doctor for medical advice about side effects. You may report side effects to FDA at 1-800-FDA-1088. Where should I keep my medicine? This drug is given in a hospital or clinic and will not be stored at home. NOTE: This sheet is a summary. It may not cover all possible information. If you have questions about this medicine, talk to your doctor, pharmacist, or health care provider.  2018 Elsevier/Gold Standard (2005-10-26 12:25:00)  

## 2017-02-15 DIAGNOSIS — E7521 Fabry (-Anderson) disease: Secondary | ICD-10-CM | POA: Diagnosis not present

## 2017-02-15 DIAGNOSIS — R600 Localized edema: Secondary | ICD-10-CM | POA: Diagnosis not present

## 2017-02-15 DIAGNOSIS — R809 Proteinuria, unspecified: Secondary | ICD-10-CM | POA: Diagnosis not present

## 2017-02-15 DIAGNOSIS — N183 Chronic kidney disease, stage 3 (moderate): Secondary | ICD-10-CM | POA: Diagnosis not present

## 2017-02-17 ENCOUNTER — Inpatient Hospital Stay: Payer: Medicare Other

## 2017-02-17 VITALS — BP 106/75 | HR 62 | Temp 97.1°F | Resp 18 | Wt 202.6 lb

## 2017-02-17 DIAGNOSIS — Z79899 Other long term (current) drug therapy: Secondary | ICD-10-CM | POA: Diagnosis not present

## 2017-02-17 DIAGNOSIS — E7521 Fabry (-Anderson) disease: Secondary | ICD-10-CM

## 2017-02-17 MED ORDER — ACETAMINOPHEN 500 MG PO TABS
1000.0000 mg | ORAL_TABLET | Freq: Once | ORAL | Status: AC
  Administered 2017-02-17: 1000 mg via ORAL
  Filled 2017-02-17: qty 2

## 2017-02-17 MED ORDER — SODIUM CHLORIDE 0.9 % IV SOLN
80.0000 mg | Freq: Once | INTRAVENOUS | Status: AC
  Administered 2017-02-17: 80 mg via INTRAVENOUS
  Filled 2017-02-17: qty 16

## 2017-02-17 MED ORDER — SODIUM CHLORIDE 0.9 % IV SOLN
Freq: Once | INTRAVENOUS | Status: AC
Start: 2017-02-17 — End: 2017-02-17
  Administered 2017-02-17: 10:00:00 via INTRAVENOUS
  Filled 2017-02-17: qty 1000

## 2017-03-03 ENCOUNTER — Inpatient Hospital Stay: Payer: Medicare Other | Attending: Oncology

## 2017-03-03 VITALS — BP 115/75 | HR 65 | Temp 96.7°F | Resp 18

## 2017-03-03 DIAGNOSIS — E7521 Fabry (-Anderson) disease: Secondary | ICD-10-CM | POA: Insufficient documentation

## 2017-03-03 DIAGNOSIS — Z79899 Other long term (current) drug therapy: Secondary | ICD-10-CM | POA: Diagnosis not present

## 2017-03-03 MED ORDER — ACETAMINOPHEN 500 MG PO TABS
1000.0000 mg | ORAL_TABLET | Freq: Once | ORAL | Status: AC
  Administered 2017-03-03: 1000 mg via ORAL

## 2017-03-03 MED ORDER — SODIUM CHLORIDE 0.9 % IV SOLN
80.0000 mg | Freq: Once | INTRAVENOUS | Status: AC
  Administered 2017-03-03: 80 mg via INTRAVENOUS
  Filled 2017-03-03: qty 16

## 2017-03-03 MED ORDER — SODIUM CHLORIDE 0.9 % IV SOLN
Freq: Once | INTRAVENOUS | Status: AC
  Administered 2017-03-03: 10:00:00 via INTRAVENOUS
  Filled 2017-03-03: qty 1000

## 2017-03-03 MED ORDER — ACETAMINOPHEN 500 MG PO TABS
ORAL_TABLET | ORAL | Status: AC
  Filled 2017-03-03: qty 2

## 2017-03-15 DIAGNOSIS — N183 Chronic kidney disease, stage 3 (moderate): Secondary | ICD-10-CM | POA: Diagnosis not present

## 2017-03-15 DIAGNOSIS — I7789 Other specified disorders of arteries and arterioles: Secondary | ICD-10-CM | POA: Diagnosis not present

## 2017-03-15 DIAGNOSIS — E876 Hypokalemia: Secondary | ICD-10-CM | POA: Diagnosis not present

## 2017-03-15 DIAGNOSIS — I517 Cardiomegaly: Secondary | ICD-10-CM | POA: Diagnosis not present

## 2017-03-15 DIAGNOSIS — E7521 Fabry (-Anderson) disease: Secondary | ICD-10-CM | POA: Diagnosis not present

## 2017-03-17 ENCOUNTER — Inpatient Hospital Stay: Payer: Medicare Other | Attending: Oncology

## 2017-03-17 VITALS — BP 111/73 | HR 63 | Temp 96.0°F | Resp 18 | Wt 200.8 lb

## 2017-03-17 DIAGNOSIS — E7521 Fabry (-Anderson) disease: Secondary | ICD-10-CM | POA: Diagnosis not present

## 2017-03-17 MED ORDER — SODIUM CHLORIDE 0.9 % IV SOLN
Freq: Once | INTRAVENOUS | Status: AC
  Administered 2017-03-17: 09:00:00 via INTRAVENOUS
  Filled 2017-03-17: qty 1000

## 2017-03-17 MED ORDER — SODIUM CHLORIDE 0.9 % IV SOLN
80.0000 mg | Freq: Once | INTRAVENOUS | Status: AC
  Administered 2017-03-17: 80 mg via INTRAVENOUS
  Filled 2017-03-17: qty 16

## 2017-03-17 MED ORDER — ACETAMINOPHEN 500 MG PO TABS
1000.0000 mg | ORAL_TABLET | Freq: Once | ORAL | Status: AC
  Administered 2017-03-17: 1000 mg via ORAL
  Filled 2017-03-17: qty 2

## 2017-03-31 ENCOUNTER — Inpatient Hospital Stay: Payer: Medicare Other

## 2017-03-31 VITALS — BP 125/86 | HR 82 | Temp 97.0°F | Resp 18 | Wt 201.7 lb

## 2017-03-31 DIAGNOSIS — E7521 Fabry (-Anderson) disease: Secondary | ICD-10-CM | POA: Diagnosis not present

## 2017-03-31 MED ORDER — ACETAMINOPHEN 500 MG PO TABS
1000.0000 mg | ORAL_TABLET | Freq: Once | ORAL | Status: AC
  Administered 2017-03-31: 1000 mg via ORAL
  Filled 2017-03-31: qty 2

## 2017-03-31 MED ORDER — SODIUM CHLORIDE 0.9 % IV SOLN
80.0000 mg | Freq: Once | INTRAVENOUS | Status: AC
  Administered 2017-03-31: 80 mg via INTRAVENOUS
  Filled 2017-03-31: qty 2
  Filled 2017-03-31: qty 16

## 2017-03-31 MED ORDER — SODIUM CHLORIDE 0.9 % IV SOLN
Freq: Once | INTRAVENOUS | Status: AC
  Administered 2017-03-31: 10:00:00 via INTRAVENOUS
  Filled 2017-03-31: qty 1000

## 2017-03-31 NOTE — Patient Instructions (Signed)
Agalsidase Beta injection What is this medicine? AGALSIDASE BETA is used to replace an enzyme that is missing in patients with Fabry disease. It is not a cure. This medicine may be used for other purposes; ask your health care provider or pharmacist if you have questions. COMMON BRAND NAME(S): Fabrazyme What should I tell my health care provider before I take this medicine? They need to know if you have any of these conditions: -heart disease -an unusual or allergic reaction to agalsidase beta, mannitol, other medicines, foods, dyes, or preservatives -pregnant or trying to get pregnant -breast-feeding How should I use this medicine? This medicine is for infusion into a vein. It is given by a health care professional in a hospital or clinic setting. Talk to your pediatrician regarding the use of this medicine in children. Special care may be needed. Overdosage: If you think you have taken too much of this medicine contact a poison control center or emergency room at once. NOTE: This medicine is only for you. Do not share this medicine with others. What if I miss a dose? It is important not to miss your dose. Call your doctor or health care professional if you are unable to keep an appointment. What may interact with this medicine? -amiodarone -chloroquine -gentamicin -hydroxychloroquine -monobenzone This list may not describe all possible interactions. Give your health care provider a list of all the medicines, herbs, non-prescription drugs, or dietary supplements you use. Also tell them if you smoke, drink alcohol, or use illegal drugs. Some items may interact with your medicine. What should I watch for while using this medicine? Visit your doctor or health care professional for regular checks on your progress. Tell your doctor or healthcare professional if your symptoms do not start to get better or if they get worse. There is a registry for patients with Fabry disease. The registry is  used to gather information about the disease and its effects. Talk to your health care provider if you would like to join the registry. What side effects may I notice from receiving this medicine? Side effects that you should report to your doctor or health care professional as soon as possible: -allergic reactions like skin rash, itching or hives, swelling of the face, lips, or tongue -breathing problems -chest pain, tightness -depression -dizziness -fast, irregular heart beat -swelling of the arms or legs Side effects that usually do not require medical attention (report to your doctor or health care professional if they continue or are bothersome): -aches or pains -anxiety -fever or chills at the time of injection -headache -nausea, vomiting -stomach pain, upset This list may not describe all possible side effects. Call your doctor for medical advice about side effects. You may report side effects to FDA at 1-800-FDA-1088. Where should I keep my medicine? This drug is given in a hospital or clinic and will not be stored at home. NOTE: This sheet is a summary. It may not cover all possible information. If you have questions about this medicine, talk to your doctor, pharmacist, or health care provider.  2018 Elsevier/Gold Standard (2005-10-26 12:25:00)  

## 2017-04-14 ENCOUNTER — Inpatient Hospital Stay: Payer: Medicare Other | Attending: Oncology

## 2017-04-14 VITALS — BP 116/68 | HR 76 | Temp 96.8°F | Resp 18

## 2017-04-14 DIAGNOSIS — E7521 Fabry (-Anderson) disease: Secondary | ICD-10-CM | POA: Diagnosis not present

## 2017-04-14 MED ORDER — SODIUM CHLORIDE 0.9 % IV SOLN
80.0000 mg | Freq: Once | INTRAVENOUS | Status: AC
  Administered 2017-04-14: 80 mg via INTRAVENOUS
  Filled 2017-04-14: qty 16

## 2017-04-14 MED ORDER — ACETAMINOPHEN 500 MG PO TABS
1000.0000 mg | ORAL_TABLET | Freq: Once | ORAL | Status: AC
  Administered 2017-04-14: 1000 mg via ORAL

## 2017-04-14 MED ORDER — ACETAMINOPHEN 500 MG PO TABS
ORAL_TABLET | ORAL | Status: AC
  Filled 2017-04-14: qty 2

## 2017-04-14 MED ORDER — SODIUM CHLORIDE 0.9 % IV SOLN
Freq: Once | INTRAVENOUS | Status: AC
  Administered 2017-04-14: 10:00:00 via INTRAVENOUS
  Filled 2017-04-14: qty 1000

## 2017-04-28 ENCOUNTER — Inpatient Hospital Stay: Payer: Medicare Other

## 2017-04-28 VITALS — BP 112/73 | HR 64 | Temp 97.0°F | Resp 18 | Wt 203.5 lb

## 2017-04-28 DIAGNOSIS — E7521 Fabry (-Anderson) disease: Secondary | ICD-10-CM | POA: Diagnosis not present

## 2017-04-28 MED ORDER — ACETAMINOPHEN 500 MG PO TABS
1000.0000 mg | ORAL_TABLET | Freq: Once | ORAL | Status: AC
  Administered 2017-04-28: 1000 mg via ORAL
  Filled 2017-04-28: qty 2

## 2017-04-28 MED ORDER — SODIUM CHLORIDE 0.9 % IV SOLN
80.0000 mg | Freq: Once | INTRAVENOUS | Status: AC
  Administered 2017-04-28: 80 mg via INTRAVENOUS
  Filled 2017-04-28: qty 2

## 2017-04-28 MED ORDER — SODIUM CHLORIDE 0.9 % IV SOLN
Freq: Once | INTRAVENOUS | Status: AC
  Administered 2017-04-28: 09:00:00 via INTRAVENOUS
  Filled 2017-04-28: qty 1000

## 2017-05-12 ENCOUNTER — Inpatient Hospital Stay: Payer: Medicare Other | Attending: Oncology

## 2017-05-12 VITALS — BP 115/77 | HR 62 | Temp 96.8°F | Resp 18 | Wt 202.9 lb

## 2017-05-12 DIAGNOSIS — E7521 Fabry (-Anderson) disease: Secondary | ICD-10-CM | POA: Insufficient documentation

## 2017-05-12 MED ORDER — SODIUM CHLORIDE 0.9 % IV SOLN
Freq: Once | INTRAVENOUS | Status: AC
  Administered 2017-05-12: 10:00:00 via INTRAVENOUS
  Filled 2017-05-12: qty 1000

## 2017-05-12 MED ORDER — ACETAMINOPHEN 500 MG PO TABS
1000.0000 mg | ORAL_TABLET | Freq: Once | ORAL | Status: AC
  Administered 2017-05-12: 1000 mg via ORAL

## 2017-05-12 MED ORDER — ACETAMINOPHEN 500 MG PO TABS
ORAL_TABLET | ORAL | Status: AC
  Filled 2017-05-12: qty 2

## 2017-05-12 MED ORDER — SODIUM CHLORIDE 0.9 % IV SOLN
80.0000 mg | Freq: Once | INTRAVENOUS | Status: AC
  Administered 2017-05-12: 80 mg via INTRAVENOUS
  Filled 2017-05-12: qty 14

## 2017-05-12 NOTE — Patient Instructions (Signed)
Agalsidase Beta injection What is this medicine? AGALSIDASE BETA is used to replace an enzyme that is missing in patients with Fabry disease. It is not a cure. This medicine may be used for other purposes; ask your health care provider or pharmacist if you have questions. COMMON BRAND NAME(S): Fabrazyme What should I tell my health care provider before I take this medicine? They need to know if you have any of these conditions: -heart disease -an unusual or allergic reaction to agalsidase beta, mannitol, other medicines, foods, dyes, or preservatives -pregnant or trying to get pregnant -breast-feeding How should I use this medicine? This medicine is for infusion into a vein. It is given by a health care professional in a hospital or clinic setting. Talk to your pediatrician regarding the use of this medicine in children. Special care may be needed. Overdosage: If you think you have taken too much of this medicine contact a poison control center or emergency room at once. NOTE: This medicine is only for you. Do not share this medicine with others. What if I miss a dose? It is important not to miss your dose. Call your doctor or health care professional if you are unable to keep an appointment. What may interact with this medicine? -amiodarone -chloroquine -gentamicin -hydroxychloroquine -monobenzone This list may not describe all possible interactions. Give your health care provider a list of all the medicines, herbs, non-prescription drugs, or dietary supplements you use. Also tell them if you smoke, drink alcohol, or use illegal drugs. Some items may interact with your medicine. What should I watch for while using this medicine? Visit your doctor or health care professional for regular checks on your progress. Tell your doctor or healthcare professional if your symptoms do not start to get better or if they get worse. There is a registry for patients with Fabry disease. The registry is  used to gather information about the disease and its effects. Talk to your health care provider if you would like to join the registry. What side effects may I notice from receiving this medicine? Side effects that you should report to your doctor or health care professional as soon as possible: -allergic reactions like skin rash, itching or hives, swelling of the face, lips, or tongue -breathing problems -chest pain, tightness -depression -dizziness -fast, irregular heart beat -swelling of the arms or legs Side effects that usually do not require medical attention (report to your doctor or health care professional if they continue or are bothersome): -aches or pains -anxiety -fever or chills at the time of injection -headache -nausea, vomiting -stomach pain, upset This list may not describe all possible side effects. Call your doctor for medical advice about side effects. You may report side effects to FDA at 1-800-FDA-1088. Where should I keep my medicine? This drug is given in a hospital or clinic and will not be stored at home. NOTE: This sheet is a summary. It may not cover all possible information. If you have questions about this medicine, talk to your doctor, pharmacist, or health care provider.  2018 Elsevier/Gold Standard (2005-10-26 12:25:00)  

## 2017-05-13 DIAGNOSIS — N189 Chronic kidney disease, unspecified: Secondary | ICD-10-CM | POA: Diagnosis not present

## 2017-05-13 DIAGNOSIS — E7521 Fabry (-Anderson) disease: Secondary | ICD-10-CM | POA: Diagnosis not present

## 2017-05-13 DIAGNOSIS — I517 Cardiomegaly: Secondary | ICD-10-CM | POA: Diagnosis not present

## 2017-05-13 DIAGNOSIS — E782 Mixed hyperlipidemia: Secondary | ICD-10-CM | POA: Diagnosis not present

## 2017-05-21 ENCOUNTER — Encounter: Payer: Self-pay | Admitting: *Deleted

## 2017-05-26 ENCOUNTER — Inpatient Hospital Stay: Payer: Medicare Other

## 2017-05-26 VITALS — BP 116/77 | HR 69 | Temp 96.7°F | Resp 18

## 2017-05-26 DIAGNOSIS — E7521 Fabry (-Anderson) disease: Secondary | ICD-10-CM

## 2017-05-26 MED ORDER — SODIUM CHLORIDE 0.9 % IV SOLN
80.0000 mg | Freq: Once | INTRAVENOUS | Status: AC
  Administered 2017-05-26: 80 mg via INTRAVENOUS
  Filled 2017-05-26: qty 14
  Filled 2017-05-26: qty 16

## 2017-05-26 MED ORDER — ACETAMINOPHEN 500 MG PO TABS
1000.0000 mg | ORAL_TABLET | Freq: Once | ORAL | Status: AC
  Administered 2017-05-26: 1000 mg via ORAL
  Filled 2017-05-26: qty 2

## 2017-05-26 MED ORDER — SODIUM CHLORIDE 0.9 % IV SOLN
Freq: Once | INTRAVENOUS | Status: AC
  Administered 2017-05-26: 10:00:00 via INTRAVENOUS
  Filled 2017-05-26: qty 1000

## 2017-06-09 ENCOUNTER — Inpatient Hospital Stay: Payer: Medicare Other | Attending: Oncology

## 2017-06-09 VITALS — BP 124/71 | HR 65 | Temp 97.0°F | Resp 18

## 2017-06-09 DIAGNOSIS — E7521 Fabry (-Anderson) disease: Secondary | ICD-10-CM | POA: Diagnosis not present

## 2017-06-09 MED ORDER — SODIUM CHLORIDE 0.9 % IV SOLN
80.0000 mg | Freq: Once | INTRAVENOUS | Status: AC
  Administered 2017-06-09: 80 mg via INTRAVENOUS
  Filled 2017-06-09: qty 16

## 2017-06-09 MED ORDER — SODIUM CHLORIDE 0.9 % IV SOLN
Freq: Once | INTRAVENOUS | Status: AC
  Administered 2017-06-09: 10:00:00 via INTRAVENOUS
  Filled 2017-06-09: qty 1000

## 2017-06-09 MED ORDER — ACETAMINOPHEN 500 MG PO TABS
1000.0000 mg | ORAL_TABLET | Freq: Once | ORAL | Status: AC
  Administered 2017-06-09: 1000 mg via ORAL
  Filled 2017-06-09: qty 2

## 2017-06-22 DIAGNOSIS — E7521 Fabry (-Anderson) disease: Secondary | ICD-10-CM | POA: Diagnosis not present

## 2017-06-22 DIAGNOSIS — R809 Proteinuria, unspecified: Secondary | ICD-10-CM | POA: Diagnosis not present

## 2017-06-22 DIAGNOSIS — N183 Chronic kidney disease, stage 3 (moderate): Secondary | ICD-10-CM | POA: Diagnosis not present

## 2017-06-23 ENCOUNTER — Inpatient Hospital Stay: Payer: Medicare Other

## 2017-06-23 VITALS — BP 117/76 | HR 75 | Temp 97.8°F | Resp 18 | Wt 198.4 lb

## 2017-06-23 DIAGNOSIS — E7521 Fabry (-Anderson) disease: Secondary | ICD-10-CM | POA: Diagnosis not present

## 2017-06-23 MED ORDER — SODIUM CHLORIDE 0.9 % IV SOLN
Freq: Once | INTRAVENOUS | Status: AC
  Administered 2017-06-23: 10:00:00 via INTRAVENOUS
  Filled 2017-06-23: qty 1000

## 2017-06-23 MED ORDER — SODIUM CHLORIDE 0.9 % IV SOLN
80.0000 mg | Freq: Once | INTRAVENOUS | Status: AC
  Administered 2017-06-23: 80 mg via INTRAVENOUS
  Filled 2017-06-23: qty 16
  Filled 2017-06-23: qty 14
  Filled 2017-06-23: qty 16

## 2017-06-23 MED ORDER — ACETAMINOPHEN 500 MG PO TABS
1000.0000 mg | ORAL_TABLET | Freq: Once | ORAL | Status: AC
  Administered 2017-06-23: 1000 mg via ORAL

## 2017-06-23 NOTE — Patient Instructions (Signed)
Agalsidase Beta injection What is this medicine? AGALSIDASE BETA is used to replace an enzyme that is missing in patients with Fabry disease. It is not a cure. This medicine may be used for other purposes; ask your health care provider or pharmacist if you have questions. COMMON BRAND NAME(S): Fabrazyme What should I tell my health care provider before I take this medicine? They need to know if you have any of these conditions: -heart disease -an unusual or allergic reaction to agalsidase beta, mannitol, other medicines, foods, dyes, or preservatives -pregnant or trying to get pregnant -breast-feeding How should I use this medicine? This medicine is for infusion into a vein. It is given by a health care professional in a hospital or clinic setting. Talk to your pediatrician regarding the use of this medicine in children. Special care may be needed. Overdosage: If you think you have taken too much of this medicine contact a poison control center or emergency room at once. NOTE: This medicine is only for you. Do not share this medicine with others. What if I miss a dose? It is important not to miss your dose. Call your doctor or health care professional if you are unable to keep an appointment. What may interact with this medicine? -amiodarone -chloroquine -gentamicin -hydroxychloroquine -monobenzone This list may not describe all possible interactions. Give your health care provider a list of all the medicines, herbs, non-prescription drugs, or dietary supplements you use. Also tell them if you smoke, drink alcohol, or use illegal drugs. Some items may interact with your medicine. What should I watch for while using this medicine? Visit your doctor or health care professional for regular checks on your progress. Tell your doctor or healthcare professional if your symptoms do not start to get better or if they get worse. There is a registry for patients with Fabry disease. The registry is  used to gather information about the disease and its effects. Talk to your health care provider if you would like to join the registry. What side effects may I notice from receiving this medicine? Side effects that you should report to your doctor or health care professional as soon as possible: -allergic reactions like skin rash, itching or hives, swelling of the face, lips, or tongue -breathing problems -chest pain, tightness -depression -dizziness -fast, irregular heart beat -swelling of the arms or legs Side effects that usually do not require medical attention (report to your doctor or health care professional if they continue or are bothersome): -aches or pains -anxiety -fever or chills at the time of injection -headache -nausea, vomiting -stomach pain, upset This list may not describe all possible side effects. Call your doctor for medical advice about side effects. You may report side effects to FDA at 1-800-FDA-1088. Where should I keep my medicine? This drug is given in a hospital or clinic and will not be stored at home. NOTE: This sheet is a summary. It may not cover all possible information. If you have questions about this medicine, talk to your doctor, pharmacist, or health care provider.  2018 Elsevier/Gold Standard (2005-10-26 12:25:00)  

## 2017-07-05 NOTE — Progress Notes (Signed)
[place]  Telephone:(336) [phone] [phone]) [phone]  ID: Jeffery Hughes OB: 1965-04-04  MR#: [id number]  QZE#:[id number]  Patient Care Team: [facility] as PCP - General (Family Medicine)  CHIEF COMPLAINT: Fabry disease requiring Fabrazyme infusion.  INTERVAL HISTORY: Patient returns to clinic today for routine six-month evaluation and continuation of Fabrazyme.  He continues to receive infusions every 2 weeks and admits to increased weakness and fatigue several days prior to each infusion.  Currently he feels well and is asymptomatic.  He denies any new neurologic complaints.  He denies any recent fevers or illnesses.  He has a good appetite and denies weight loss.  He denies any pain.  He has no chest pain or shortness of breath.  He denies any nausea, vomiting, constipation, or diarrhea.  He has no urinary complaints.  Patient offers no further specific complaints today.   REVIEW OF SYSTEMS:   Review of Systems  Constitutional: Negative for fever, malaise/fatigue and weight loss.  Respiratory: Negative.  Negative for cough and shortness of breath.   Cardiovascular: Negative.  Negative for chest pain and leg swelling.  Gastrointestinal: Negative.  Negative for abdominal pain.  Genitourinary: Negative.  Negative for dysuria.  Musculoskeletal: Negative.  Negative for myalgias.  Skin: Negative.  Negative for rash.  Neurological: Negative for sensory change, focal weakness and weakness.  Psychiatric/Behavioral: Negative.  Negative for depression. The patient is not nervous/anxious.     As per HPI. Otherwise, a complete review of systems is negative.  PAST MEDICAL HISTORY: Past Medical History:  Diagnosis Date  . CKD (chronic kidney disease), stage II    a. 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 Fabry disease ([place])    a. initially diagnosed in [place], [place] in ~ 2003 to 2004, previously treated with Fabrazyme  . Fabry disease ([place])   . Hypertension    . LVH (left ventricular hypertrophy)   . Mixed hyperlipidemia   . Vitamin D deficiency     PAST SURGICAL HISTORY: Past Surgical History:  Procedure Laterality Date  . KNEE SURGERY Left    20+ years ago, 6+ years ago  . NO PAST SURGERIES      FAMILY HISTORY: Brother with Fabry's disease, otherwise negative.     ADVANCED DIRECTIVES:    HEALTH MAINTENANCE: Social History   Tobacco Use  . Smoking status: Never Smoker  . Smokeless tobacco: Never Used  Substance Use Topics  . Alcohol use: No    Alcohol/week: 0.0 oz  . Drug use: No     Colonoscopy:  PAP:  Bone density:  Lipid panel:  Allergies  Allergen Reactions  . Other Other (See Comments)    Nephrologist recommended avoiding contrast.  . Penicillins Other (See Comments)    unknown    Current Outpatient Medications  Medication Sig Dispense Refill  . albuterol (PROVENTIL HFA;VENTOLIN HFA) 108 (90 BASE) MCG/ACT inhaler Inhale 2 puffs into the lungs every 6 (six) hours as needed for wheezing or shortness of breath. (Patient not taking: Reported on 03/17/2017) 1 Inhaler 2  . aspirin 81 MG tablet Take 81 mg by mouth daily.    . cyanocobalamin 1000 MCG tablet Take 100 mcg by mouth daily.    . diphenoxylate-atropine (LOMOTIL) 2.5-0.025 MG per tablet Take 2 tablets by mouth 4 (four) times daily as needed.     . furosemide (LASIX) 40 MG tablet Take 40 mg by mouth daily.    Marland Kitchen guaiFENesin-codeine 100-10 MG/5ML syrup Take 5 mLs by mouth every 4 (  four) hours as needed. (Patient not taking: Reported on 03/17/2017) 120 mL 0  . loratadine (CLARITIN) 10 MG tablet Take 10 mg by mouth daily as needed.     . loratadine (CLARITIN) 10 MG tablet Take 10 mg by mouth.    . Multiple Vitamin (MULTIVITAMIN) tablet Take 1 tablet by mouth daily.    . potassium chloride SA (K-DUR,KLOR-CON) 20 MEQ tablet Take 20 mEq by mouth 2 (two) times daily.    . valsartan (DIOVAN) 160 MG tablet Take 160 mg by mouth daily. Pt takes 1 in the morning and 1/2  tablet every evening.     No current facility-administered medications for this visit.    Facility-Administered Medications Ordered in Other Visits  Medication Dose Route Frequency Provider Last Rate Last Dose  . acetaminophen (TYLENOL) tablet 1,000 mg  1,000 mg Oral Once Lloyd Huger, MD        OBJECTIVE: There were no vitals filed for this visit.   There is no height or weight on file to calculate BMI.    ECOG FS:0 - Asymptomatic  General: Well-developed, well-nourished, no acute distress. Eyes: Pink conjunctiva, anicteric sclera. HEENT: Normocephalic, moist mucous membranes, clear oropharnyx. Lungs: Clear to auscultation bilaterally. Heart: Regular rate and rhythm. No rubs, murmurs, or gallops. Abdomen: Soft, nontender, nondistended. No organomegaly noted, normoactive bowel sounds. Musculoskeletal: No edema, cyanosis, or clubbing. Neuro: Alert, answering all questions appropriately. Cranial nerves grossly intact. Skin: No rashes or petechiae noted. Psych: Normal affect. Lymphatics: No cervical, calvicular, axillary or inguinal LAD.   LAB RESULTS:  Lab Results  Component Value Date   NA 133 (L) 05/21/2015   K 3.1 (L) 05/21/2015   CL 109 05/21/2015   CO2 17 (L) 05/21/2015   GLUCOSE 96 05/21/2015   BUN 16 05/21/2015   CREATININE 1.62 (H) 05/21/2015   CALCIUM 9.0 05/21/2015   PROT 7.7 05/21/2015   ALBUMIN 3.7 05/21/2015   AST 55 (H) 05/21/2015   ALT 86 (H) 05/21/2015   ALKPHOS 62 05/21/2015   BILITOT 0.8 05/21/2015   GFRNONAA 48 (L) 05/21/2015   GFRAA 55 (L) 05/21/2015    Lab Results  Component Value Date   WBC 7.0 05/21/2015   HGB 14.7 05/21/2015   HCT 43.4 05/21/2015   MCV 85.9 05/21/2015   PLT 370 05/21/2015     STUDIES: No results found.  ASSESSMENT: Fabry disease requiring Fabrazyme infusion.  PLAN:    1. Fabry disease requiring Fabrazyme infusion: Patient will require 1 mg/kg of Fabrazyme every 2 weeks indefinitely.  He will receive 1000 mg  Tylenol and 25 mg Benadryl approximately 30 minutes prior to each infusion. He expressed understanding that we can only provide his infusion.  Any laboratory work or questions regarding his disease or infusions must be directed towards to his primary geneticist or endocrinologist.  Return to clinic every 2 weeks for his infusion and then in 6 months for routine follow-up. 2.  Chronic renal insufficiency: Continue monitoring and treatment per nephrology.  Approximately 30 minutes was spent in discussion of which greater than 50% was consultation.  Patient expressed understanding and was in agreement with this plan. He also understands that He can call clinic at any time with any questions, concerns, or complaints.   PCP: Dr. Ernest Pine at [facility] Geneticist:  Dr. Karenann Cai at [place], [place]   07/09/2017 12:47 PM

## 2017-07-09 ENCOUNTER — Inpatient Hospital Stay: Payer: Medicare Other

## 2017-07-09 ENCOUNTER — Inpatient Hospital Stay: Payer: Medicare Other | Attending: Oncology | Admitting: Oncology

## 2017-07-09 VITALS — BP 123/75 | HR 69 | Temp 97.8°F | Resp 18

## 2017-07-09 DIAGNOSIS — I129 Hypertensive chronic kidney disease with stage 1 through stage 4 chronic kidney disease, or unspecified chronic kidney disease: Secondary | ICD-10-CM | POA: Diagnosis not present

## 2017-07-09 DIAGNOSIS — E7521 Fabry (-Anderson) disease: Secondary | ICD-10-CM | POA: Insufficient documentation

## 2017-07-09 DIAGNOSIS — N182 Chronic kidney disease, stage 2 (mild): Secondary | ICD-10-CM

## 2017-07-09 MED ORDER — SODIUM CHLORIDE 0.9 % IV SOLN
80.0000 mg | Freq: Once | INTRAVENOUS | Status: AC
  Administered 2017-07-09: 80 mg via INTRAVENOUS
  Filled 2017-07-09 (×2): qty 16

## 2017-07-09 MED ORDER — SODIUM CHLORIDE 0.9 % IV SOLN
Freq: Once | INTRAVENOUS | Status: AC
  Administered 2017-07-09: 10:00:00 via INTRAVENOUS
  Filled 2017-07-09: qty 1000

## 2017-07-09 MED ORDER — ACETAMINOPHEN 500 MG PO TABS
1000.0000 mg | ORAL_TABLET | Freq: Once | ORAL | Status: AC
  Administered 2017-07-09: 1000 mg via ORAL
  Filled 2017-07-09: qty 2

## 2017-07-09 NOTE — Progress Notes (Signed)
Patient seen in infusion.  

## 2017-07-23 ENCOUNTER — Inpatient Hospital Stay: Payer: Medicare Other

## 2017-07-23 VITALS — BP 134/69 | HR 76 | Temp 98.1°F | Resp 18

## 2017-07-23 DIAGNOSIS — E7521 Fabry (-Anderson) disease: Secondary | ICD-10-CM | POA: Diagnosis not present

## 2017-07-23 MED ORDER — SODIUM CHLORIDE 0.9 % IV SOLN
80.0000 mg | Freq: Once | INTRAVENOUS | Status: AC
  Administered 2017-07-23: 80 mg via INTRAVENOUS
  Filled 2017-07-23 (×2): qty 16

## 2017-07-23 MED ORDER — ACETAMINOPHEN 500 MG PO TABS
1000.0000 mg | ORAL_TABLET | Freq: Once | ORAL | Status: AC
  Administered 2017-07-23: 1000 mg via ORAL
  Filled 2017-07-23: qty 2

## 2017-07-23 MED ORDER — SODIUM CHLORIDE 0.9 % IV SOLN
Freq: Once | INTRAVENOUS | Status: AC
  Administered 2017-07-23: 09:00:00 via INTRAVENOUS
  Filled 2017-07-23: qty 1000

## 2017-08-04 ENCOUNTER — Inpatient Hospital Stay: Payer: Medicare Other | Attending: Oncology

## 2017-08-04 VITALS — BP 108/69 | HR 56 | Temp 97.5°F | Resp 18 | Wt 199.8 lb

## 2017-08-04 DIAGNOSIS — E7521 Fabry (-Anderson) disease: Secondary | ICD-10-CM | POA: Insufficient documentation

## 2017-08-04 MED ORDER — SODIUM CHLORIDE 0.9 % IV SOLN
80.0000 mg | Freq: Once | INTRAVENOUS | Status: AC
  Administered 2017-08-04: 80 mg via INTRAVENOUS
  Filled 2017-08-04: qty 16

## 2017-08-04 MED ORDER — ACETAMINOPHEN 500 MG PO TABS
1000.0000 mg | ORAL_TABLET | Freq: Once | ORAL | Status: AC
  Administered 2017-08-04: 1000 mg via ORAL
  Filled 2017-08-04: qty 2

## 2017-08-04 MED ORDER — SODIUM CHLORIDE 0.9 % IV SOLN
Freq: Once | INTRAVENOUS | Status: AC
  Administered 2017-08-04: 10:00:00 via INTRAVENOUS
  Filled 2017-08-04: qty 1000

## 2017-08-04 NOTE — Patient Instructions (Signed)
Agalsidase Beta injection What is this medicine? AGALSIDASE BETA is used to replace an enzyme that is missing in patients with Fabry disease. It is not a cure. This medicine may be used for other purposes; ask your health care provider or pharmacist if you have questions. COMMON BRAND NAME(S): Fabrazyme What should I tell my health care provider before I take this medicine? They need to know if you have any of these conditions: -heart disease -an unusual or allergic reaction to agalsidase beta, mannitol, other medicines, foods, dyes, or preservatives -pregnant or trying to get pregnant -breast-feeding How should I use this medicine? This medicine is for infusion into a vein. It is given by a health care professional in a hospital or clinic setting. Talk to your pediatrician regarding the use of this medicine in children. Special care may be needed. Overdosage: If you think you have taken too much of this medicine contact a poison control center or emergency room at once. NOTE: This medicine is only for you. Do not share this medicine with others. What if I miss a dose? It is important not to miss your dose. Call your doctor or health care professional if you are unable to keep an appointment. What may interact with this medicine? -amiodarone -chloroquine -gentamicin -hydroxychloroquine -monobenzone This list may not describe all possible interactions. Give your health care provider a list of all the medicines, herbs, non-prescription drugs, or dietary supplements you use. Also tell them if you smoke, drink alcohol, or use illegal drugs. Some items may interact with your medicine. What should I watch for while using this medicine? Visit your doctor or health care professional for regular checks on your progress. Tell your doctor or healthcare professional if your symptoms do not start to get better or if they get worse. There is a registry for patients with Fabry disease. The registry is  used to gather information about the disease and its effects. Talk to your health care provider if you would like to join the registry. What side effects may I notice from receiving this medicine? Side effects that you should report to your doctor or health care professional as soon as possible: -allergic reactions like skin rash, itching or hives, swelling of the face, lips, or tongue -breathing problems -chest pain, tightness -depression -dizziness -fast, irregular heart beat -swelling of the arms or legs Side effects that usually do not require medical attention (report to your doctor or health care professional if they continue or are bothersome): -aches or pains -anxiety -fever or chills at the time of injection -headache -nausea, vomiting -stomach pain, upset This list may not describe all possible side effects. Call your doctor for medical advice about side effects. You may report side effects to FDA at 1-800-FDA-1088. Where should I keep my medicine? This drug is given in a hospital or clinic and will not be stored at home. NOTE: This sheet is a summary. It may not cover all possible information. If you have questions about this medicine, talk to your doctor, pharmacist, or health care provider.  2018 Elsevier/Gold Standard (2005-10-26 12:25:00)  

## 2017-08-06 ENCOUNTER — Inpatient Hospital Stay: Payer: Medicare Other

## 2017-08-18 ENCOUNTER — Inpatient Hospital Stay: Payer: Medicare Other

## 2017-08-18 VITALS — BP 126/81 | HR 76 | Temp 97.0°F | Resp 18 | Wt 198.9 lb

## 2017-08-18 DIAGNOSIS — E7521 Fabry (-Anderson) disease: Secondary | ICD-10-CM

## 2017-08-18 MED ORDER — ACETAMINOPHEN 500 MG PO TABS
1000.0000 mg | ORAL_TABLET | Freq: Once | ORAL | Status: AC
  Administered 2017-08-18: 1000 mg via ORAL
  Filled 2017-08-18: qty 2

## 2017-08-18 MED ORDER — SODIUM CHLORIDE 0.9 % IV SOLN
80.0000 mg | Freq: Once | INTRAVENOUS | Status: AC
  Administered 2017-08-18: 80 mg via INTRAVENOUS
  Filled 2017-08-18: qty 16

## 2017-08-18 MED ORDER — SODIUM CHLORIDE 0.9 % IV SOLN
Freq: Once | INTRAVENOUS | Status: AC
  Administered 2017-08-18: 10:00:00 via INTRAVENOUS
  Filled 2017-08-18: qty 1000

## 2017-08-18 NOTE — Patient Instructions (Signed)
Agalsidase Beta injection What is this medicine? AGALSIDASE BETA is used to replace an enzyme that is missing in patients with Fabry disease. It is not a cure. This medicine may be used for other purposes; ask your health care provider or pharmacist if you have questions. COMMON BRAND NAME(S): Fabrazyme What should I tell my health care provider before I take this medicine? They need to know if you have any of these conditions: -heart disease -an unusual or allergic reaction to agalsidase beta, mannitol, other medicines, foods, dyes, or preservatives -pregnant or trying to get pregnant -breast-feeding How should I use this medicine? This medicine is for infusion into a vein. It is given by a health care professional in a hospital or clinic setting. Talk to your pediatrician regarding the use of this medicine in children. Special care may be needed. Overdosage: If you think you have taken too much of this medicine contact a poison control center or emergency room at once. NOTE: This medicine is only for you. Do not share this medicine with others. What if I miss a dose? It is important not to miss your dose. Call your doctor or health care professional if you are unable to keep an appointment. What may interact with this medicine? -amiodarone -chloroquine -gentamicin -hydroxychloroquine -monobenzone This list may not describe all possible interactions. Give your health care provider a list of all the medicines, herbs, non-prescription drugs, or dietary supplements you use. Also tell them if you smoke, drink alcohol, or use illegal drugs. Some items may interact with your medicine. What should I watch for while using this medicine? Visit your doctor or health care professional for regular checks on your progress. Tell your doctor or healthcare professional if your symptoms do not start to get better or if they get worse. There is a registry for patients with Fabry disease. The registry is  used to gather information about the disease and its effects. Talk to your health care provider if you would like to join the registry. What side effects may I notice from receiving this medicine? Side effects that you should report to your doctor or health care professional as soon as possible: -allergic reactions like skin rash, itching or hives, swelling of the face, lips, or tongue -breathing problems -chest pain, tightness -depression -dizziness -fast, irregular heart beat -swelling of the arms or legs Side effects that usually do not require medical attention (report to your doctor or health care professional if they continue or are bothersome): -aches or pains -anxiety -fever or chills at the time of injection -headache -nausea, vomiting -stomach pain, upset This list may not describe all possible side effects. Call your doctor for medical advice about side effects. You may report side effects to FDA at 1-800-FDA-1088. Where should I keep my medicine? This drug is given in a hospital or clinic and will not be stored at home. NOTE: This sheet is a summary. It may not cover all possible information. If you have questions about this medicine, talk to your doctor, pharmacist, or health care provider.  2018 Elsevier/Gold Standard (2005-10-26 12:25:00)  

## 2017-08-20 ENCOUNTER — Inpatient Hospital Stay: Payer: Medicare Other

## 2017-09-01 ENCOUNTER — Inpatient Hospital Stay: Payer: Medicare Other | Attending: Oncology

## 2017-09-01 VITALS — BP 118/77 | HR 57 | Temp 97.4°F | Resp 18 | Wt 202.0 lb

## 2017-09-01 DIAGNOSIS — E7521 Fabry (-Anderson) disease: Secondary | ICD-10-CM | POA: Diagnosis not present

## 2017-09-01 MED ORDER — ACETAMINOPHEN 500 MG PO TABS
1000.0000 mg | ORAL_TABLET | Freq: Once | ORAL | Status: AC
  Administered 2017-09-01: 1000 mg via ORAL

## 2017-09-01 MED ORDER — SODIUM CHLORIDE 0.9 % IV SOLN
80.0000 mg | Freq: Once | INTRAVENOUS | Status: AC
  Administered 2017-09-01: 80 mg via INTRAVENOUS
  Filled 2017-09-01: qty 14

## 2017-09-01 MED ORDER — SODIUM CHLORIDE 0.9 % IV SOLN
Freq: Once | INTRAVENOUS | Status: AC
  Administered 2017-09-01: 09:00:00 via INTRAVENOUS
  Filled 2017-09-01: qty 1000

## 2017-09-03 ENCOUNTER — Inpatient Hospital Stay: Payer: Medicare Other

## 2017-09-15 ENCOUNTER — Inpatient Hospital Stay: Payer: Medicare Other

## 2017-09-15 VITALS — BP 116/78 | HR 67 | Temp 97.4°F | Resp 18

## 2017-09-15 DIAGNOSIS — E7521 Fabry (-Anderson) disease: Secondary | ICD-10-CM

## 2017-09-15 MED ORDER — ACETAMINOPHEN 500 MG PO TABS
1000.0000 mg | ORAL_TABLET | Freq: Once | ORAL | Status: AC
  Administered 2017-09-15: 1000 mg via ORAL
  Filled 2017-09-15: qty 2

## 2017-09-15 MED ORDER — SODIUM CHLORIDE 0.9 % IV SOLN
80.0000 mg | Freq: Once | INTRAVENOUS | Status: AC
  Administered 2017-09-15: 80 mg via INTRAVENOUS
  Filled 2017-09-15: qty 16

## 2017-09-15 MED ORDER — SODIUM CHLORIDE 0.9 % IV SOLN
Freq: Once | INTRAVENOUS | Status: AC
  Administered 2017-09-15: 10:00:00 via INTRAVENOUS
  Filled 2017-09-15: qty 1000

## 2017-09-17 ENCOUNTER — Inpatient Hospital Stay: Payer: Medicare Other

## 2017-09-17 IMAGING — CR DG CHEST 2V
2 series · 2 of 2 positions shown · non-contrast
Comparison: None.

CLINICAL DATA: Productive cough and increasing anterior chest
tightness for 2 weeks. Initial encounter.

EXAM:
CHEST  2 VIEW

[chest pa]
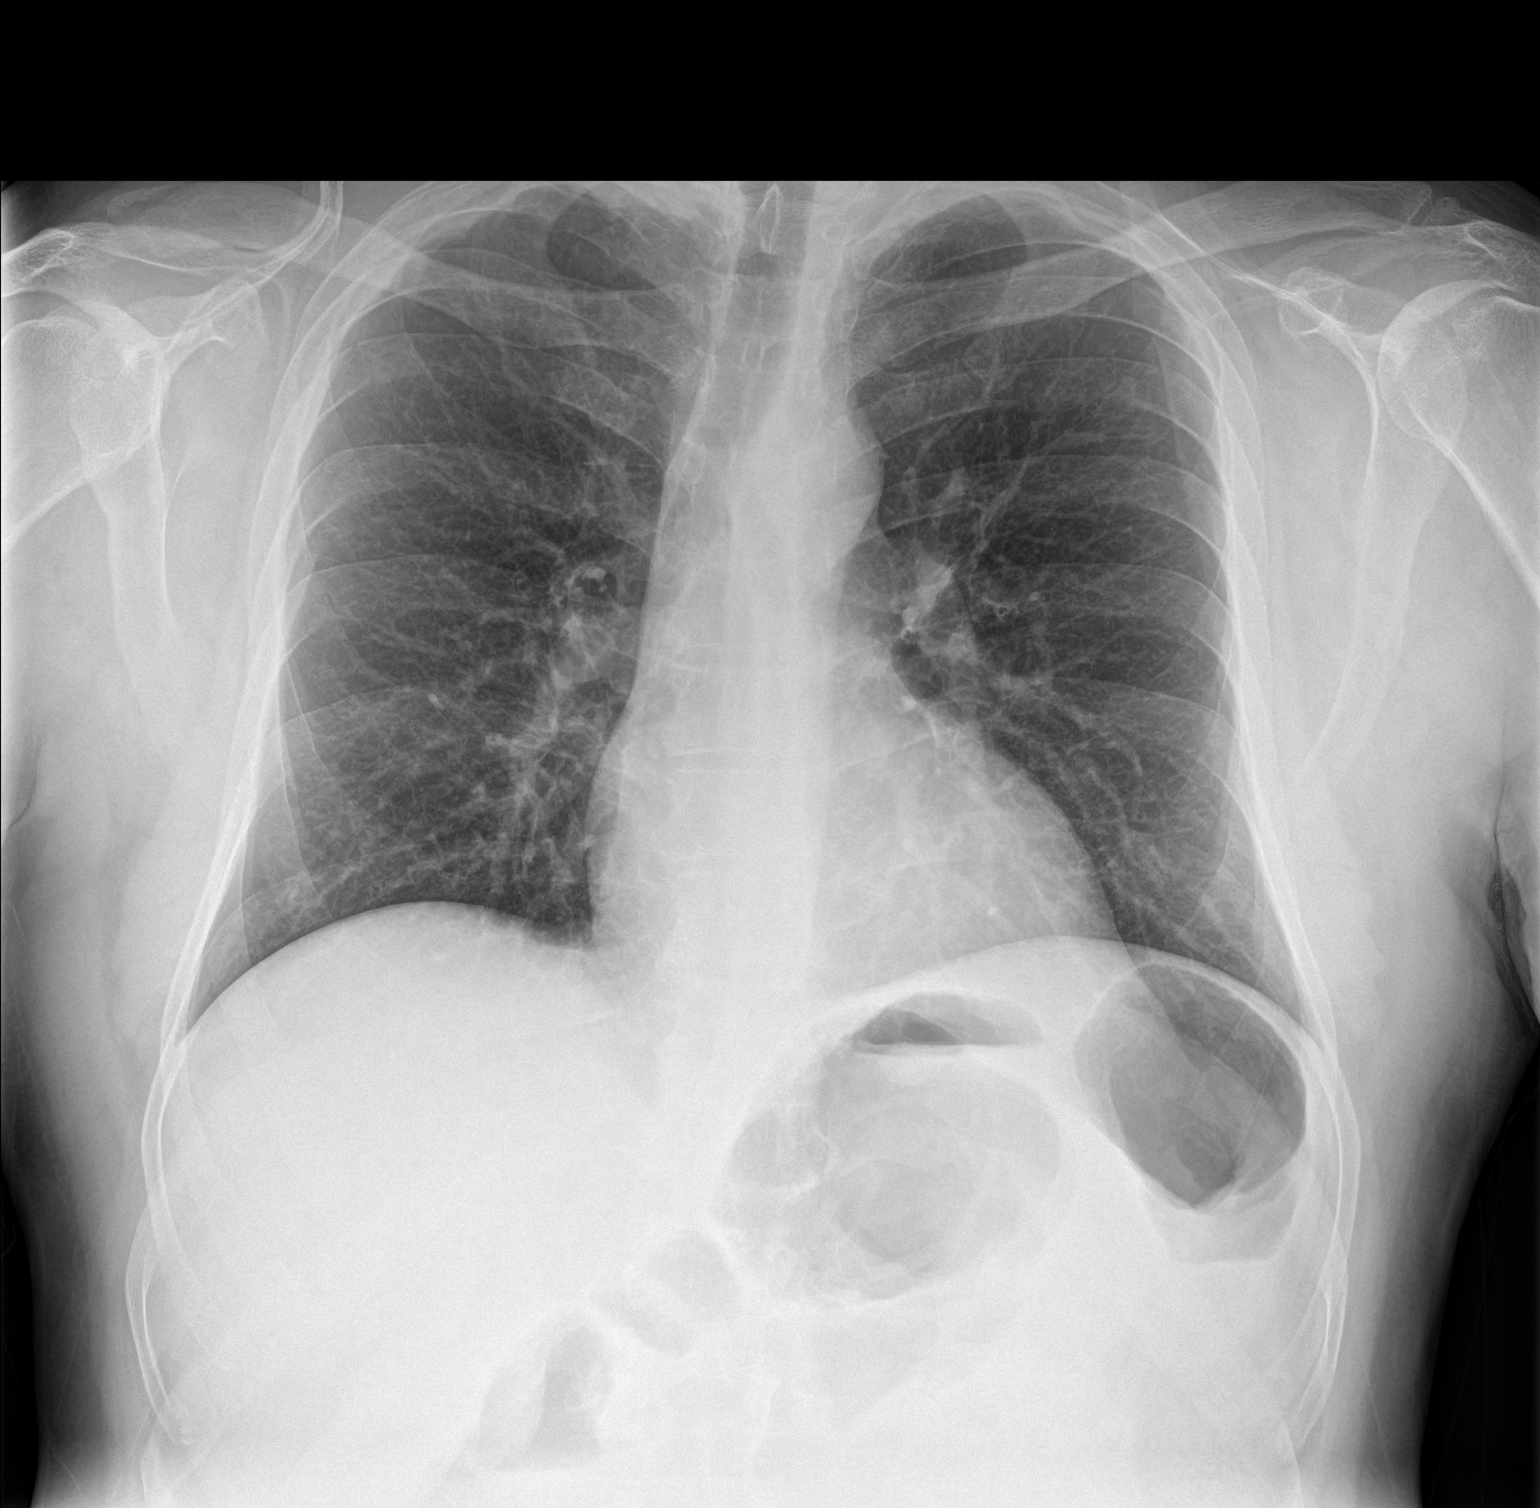

[chest lat]
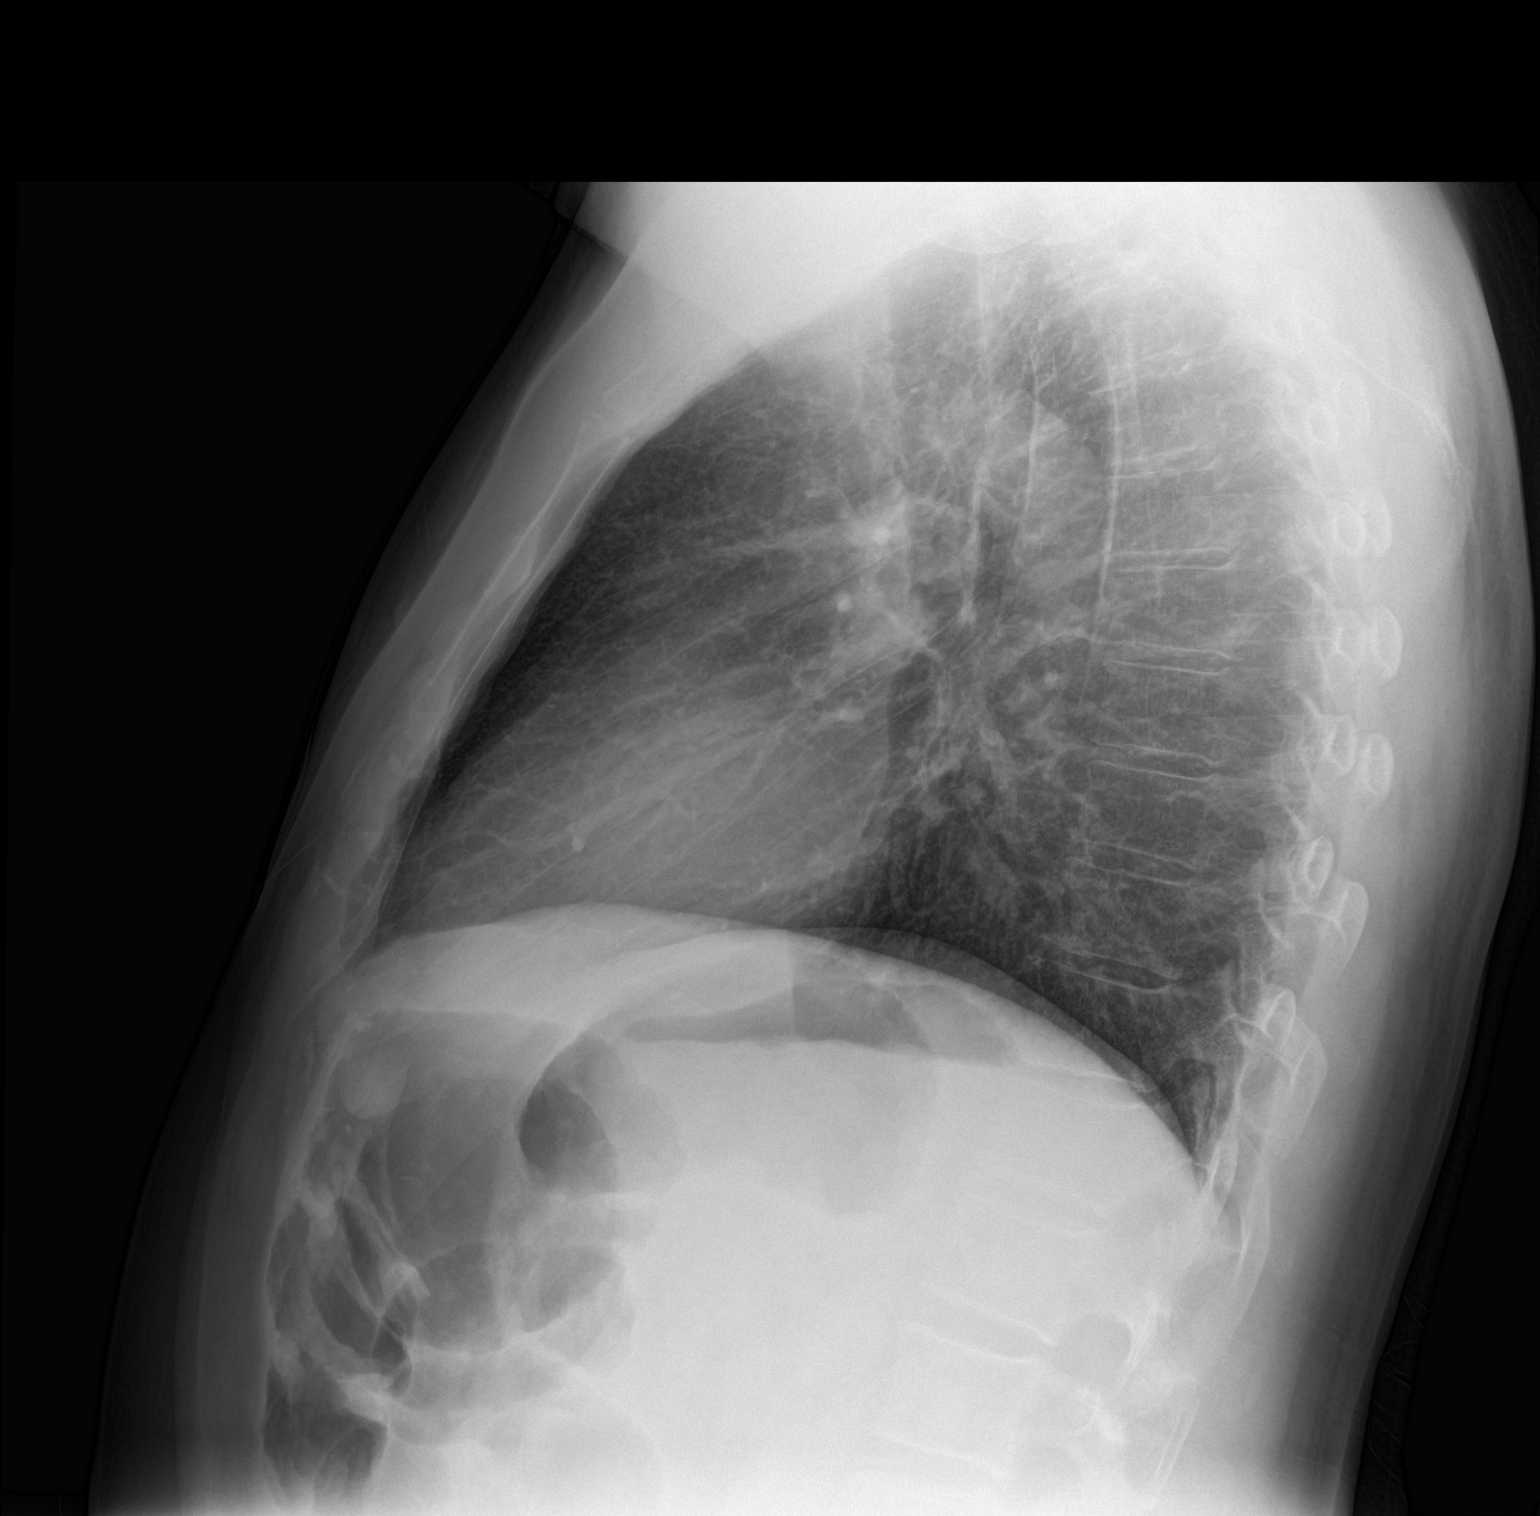

[2 of 2 positions shown; findings below may reference images not displayed]

FINDINGS: The lungs are clear. Heart size is normal. There is no pneumothorax
or pleural effusion. No focal bony abnormality is identified.
IMPRESSION: Negative chest.

## 2017-09-29 ENCOUNTER — Inpatient Hospital Stay: Payer: Medicare Other

## 2017-09-29 VITALS — BP 127/81 | HR 70 | Temp 98.1°F | Resp 18 | Wt 202.4 lb

## 2017-09-29 DIAGNOSIS — E7521 Fabry (-Anderson) disease: Secondary | ICD-10-CM | POA: Diagnosis not present

## 2017-09-29 MED ORDER — SODIUM CHLORIDE 0.9 % IV SOLN
80.0000 mg | Freq: Once | INTRAVENOUS | Status: AC
  Administered 2017-09-29: 80 mg via INTRAVENOUS
  Filled 2017-09-29: qty 16

## 2017-09-29 MED ORDER — ACETAMINOPHEN 500 MG PO TABS
ORAL_TABLET | ORAL | Status: AC
  Filled 2017-09-29: qty 2

## 2017-09-29 MED ORDER — ACETAMINOPHEN 500 MG PO TABS
1000.0000 mg | ORAL_TABLET | Freq: Once | ORAL | Status: AC
  Administered 2017-09-29: 1000 mg via ORAL

## 2017-09-29 MED ORDER — SODIUM CHLORIDE 0.9 % IV SOLN
Freq: Once | INTRAVENOUS | Status: AC
  Administered 2017-09-29: 10:00:00 via INTRAVENOUS
  Filled 2017-09-29: qty 1000

## 2017-10-01 ENCOUNTER — Inpatient Hospital Stay: Payer: Medicare Other

## 2017-10-13 ENCOUNTER — Inpatient Hospital Stay: Payer: Medicare Other | Attending: Oncology

## 2017-10-13 VITALS — BP 119/93 | HR 69 | Temp 97.1°F | Resp 20 | Wt 202.8 lb

## 2017-10-13 DIAGNOSIS — E7521 Fabry (-Anderson) disease: Secondary | ICD-10-CM

## 2017-10-13 MED ORDER — SODIUM CHLORIDE 0.9 % IV SOLN
Freq: Once | INTRAVENOUS | Status: AC
  Administered 2017-10-13: 09:00:00 via INTRAVENOUS
  Filled 2017-10-13: qty 1000

## 2017-10-13 MED ORDER — ACETAMINOPHEN 500 MG PO TABS
1000.0000 mg | ORAL_TABLET | Freq: Once | ORAL | Status: AC
  Administered 2017-10-13: 1000 mg via ORAL
  Filled 2017-10-13: qty 2

## 2017-10-13 MED ORDER — SODIUM CHLORIDE 0.9 % IV SOLN
80.0000 mg | Freq: Once | INTRAVENOUS | Status: AC
  Administered 2017-10-13: 80 mg via INTRAVENOUS
  Filled 2017-10-13: qty 14
  Filled 2017-10-13: qty 16

## 2017-10-13 NOTE — Patient Instructions (Signed)
Agalsidase Beta injection What is this medicine? AGALSIDASE BETA is used to replace an enzyme that is missing in patients with Fabry disease. It is not a cure. This medicine may be used for other purposes; ask your health care provider or pharmacist if you have questions. COMMON BRAND NAME(S): Fabrazyme What should I tell my health care provider before I take this medicine? They need to know if you have any of these conditions: -heart disease -an unusual or allergic reaction to agalsidase beta, mannitol, other medicines, foods, dyes, or preservatives -pregnant or trying to get pregnant -breast-feeding How should I use this medicine? This medicine is for infusion into a vein. It is given by a health care professional in a hospital or clinic setting. Talk to your pediatrician regarding the use of this medicine in children. Special care may be needed. Overdosage: If you think you have taken too much of this medicine contact a poison control center or emergency room at once. NOTE: This medicine is only for you. Do not share this medicine with others. What if I miss a dose? It is important not to miss your dose. Call your doctor or health care professional if you are unable to keep an appointment. What may interact with this medicine? -amiodarone -chloroquine -gentamicin -hydroxychloroquine -monobenzone This list may not describe all possible interactions. Give your health care provider a list of all the medicines, herbs, non-prescription drugs, or dietary supplements you use. Also tell them if you smoke, drink alcohol, or use illegal drugs. Some items may interact with your medicine. What should I watch for while using this medicine? Visit your doctor or health care professional for regular checks on your progress. Tell your doctor or healthcare professional if your symptoms do not start to get better or if they get worse. There is a registry for patients with Fabry disease. The registry is  used to gather information about the disease and its effects. Talk to your health care provider if you would like to join the registry. What side effects may I notice from receiving this medicine? Side effects that you should report to your doctor or health care professional as soon as possible: -allergic reactions like skin rash, itching or hives, swelling of the face, lips, or tongue -breathing problems -chest pain, tightness -depression -dizziness -fast, irregular heart beat -swelling of the arms or legs Side effects that usually do not require medical attention (report to your doctor or health care professional if they continue or are bothersome): -aches or pains -anxiety -fever or chills at the time of injection -headache -nausea, vomiting -stomach pain, upset This list may not describe all possible side effects. Call your doctor for medical advice about side effects. You may report side effects to FDA at 1-800-FDA-1088. Where should I keep my medicine? This drug is given in a hospital or clinic and will not be stored at home. NOTE: This sheet is a summary. It may not cover all possible information. If you have questions about this medicine, talk to your doctor, pharmacist, or health care provider.  2018 Elsevier/Gold Standard (2005-10-26 12:25:00)

## 2017-10-15 ENCOUNTER — Inpatient Hospital Stay: Payer: Medicare Other

## 2017-10-27 ENCOUNTER — Inpatient Hospital Stay: Payer: Medicare Other

## 2017-10-27 VITALS — BP 112/73 | HR 72 | Temp 96.5°F | Resp 20 | Wt 198.6 lb

## 2017-10-27 DIAGNOSIS — E7521 Fabry (-Anderson) disease: Secondary | ICD-10-CM | POA: Diagnosis not present

## 2017-10-27 MED ORDER — SODIUM CHLORIDE 0.9 % IV SOLN
Freq: Once | INTRAVENOUS | Status: AC
  Administered 2017-10-27: 10:00:00 via INTRAVENOUS
  Filled 2017-10-27: qty 250

## 2017-10-27 MED ORDER — SODIUM CHLORIDE 0.9 % IV SOLN
80.0000 mg | Freq: Once | INTRAVENOUS | Status: AC
  Administered 2017-10-27: 80 mg via INTRAVENOUS
  Filled 2017-10-27: qty 14

## 2017-10-27 MED ORDER — ACETAMINOPHEN 500 MG PO TABS
1000.0000 mg | ORAL_TABLET | Freq: Once | ORAL | Status: AC
  Administered 2017-10-27: 1000 mg via ORAL
  Filled 2017-10-27: qty 2

## 2017-10-27 NOTE — Patient Instructions (Signed)
Agalsidase Beta injection What is this medicine? AGALSIDASE BETA is used to replace an enzyme that is missing in patients with Fabry disease. It is not a cure. This medicine may be used for other purposes; ask your health care provider or pharmacist if you have questions. COMMON BRAND NAME(S): Fabrazyme What should I tell my health care provider before I take this medicine? They need to know if you have any of these conditions: -heart disease -an unusual or allergic reaction to agalsidase beta, mannitol, other medicines, foods, dyes, or preservatives -pregnant or trying to get pregnant -breast-feeding How should I use this medicine? This medicine is for infusion into a vein. It is given by a health care professional in a hospital or clinic setting. Talk to your pediatrician regarding the use of this medicine in children. Special care may be needed. Overdosage: If you think you have taken too much of this medicine contact a poison control center or emergency room at once. NOTE: This medicine is only for you. Do not share this medicine with others. What if I miss a dose? It is important not to miss your dose. Call your doctor or health care professional if you are unable to keep an appointment. What may interact with this medicine? -amiodarone -chloroquine -gentamicin -hydroxychloroquine -monobenzone This list may not describe all possible interactions. Give your health care provider a list of all the medicines, herbs, non-prescription drugs, or dietary supplements you use. Also tell them if you smoke, drink alcohol, or use illegal drugs. Some items may interact with your medicine. What should I watch for while using this medicine? Visit your doctor or health care professional for regular checks on your progress. Tell your doctor or healthcare professional if your symptoms do not start to get better or if they get worse. There is a registry for patients with Fabry disease. The registry is  used to gather information about the disease and its effects. Talk to your health care provider if you would like to join the registry. What side effects may I notice from receiving this medicine? Side effects that you should report to your doctor or health care professional as soon as possible: -allergic reactions like skin rash, itching or hives, swelling of the face, lips, or tongue -breathing problems -chest pain, tightness -depression -dizziness -fast, irregular heart beat -swelling of the arms or legs Side effects that usually do not require medical attention (report to your doctor or health care professional if they continue or are bothersome): -aches or pains -anxiety -fever or chills at the time of injection -headache -nausea, vomiting -stomach pain, upset This list may not describe all possible side effects. Call your doctor for medical advice about side effects. You may report side effects to FDA at 1-800-FDA-1088. Where should I keep my medicine? This drug is given in a hospital or clinic and will not be stored at home. NOTE: This sheet is a summary. It may not cover all possible information. If you have questions about this medicine, talk to your doctor, pharmacist, or health care provider.  2018 Elsevier/Gold Standard (2005-10-26 12:25:00)

## 2017-10-28 DIAGNOSIS — R600 Localized edema: Secondary | ICD-10-CM | POA: Diagnosis not present

## 2017-10-28 DIAGNOSIS — E7521 Fabry (-Anderson) disease: Secondary | ICD-10-CM | POA: Diagnosis not present

## 2017-10-28 DIAGNOSIS — R809 Proteinuria, unspecified: Secondary | ICD-10-CM | POA: Diagnosis not present

## 2017-10-28 DIAGNOSIS — N183 Chronic kidney disease, stage 3 (moderate): Secondary | ICD-10-CM | POA: Diagnosis not present

## 2017-10-29 ENCOUNTER — Inpatient Hospital Stay: Payer: Medicare Other

## 2017-11-10 ENCOUNTER — Inpatient Hospital Stay: Payer: Medicare Other | Attending: Oncology

## 2017-11-10 VITALS — BP 121/74 | HR 60 | Temp 96.7°F | Resp 18

## 2017-11-10 DIAGNOSIS — E7521 Fabry (-Anderson) disease: Secondary | ICD-10-CM | POA: Insufficient documentation

## 2017-11-10 MED ORDER — SODIUM CHLORIDE 0.9 % IV SOLN
Freq: Once | INTRAVENOUS | Status: AC
  Administered 2017-11-10: 10:00:00 via INTRAVENOUS
  Filled 2017-11-10: qty 250

## 2017-11-10 MED ORDER — ACETAMINOPHEN 500 MG PO TABS
ORAL_TABLET | ORAL | Status: AC
  Filled 2017-11-10: qty 2

## 2017-11-10 MED ORDER — SODIUM CHLORIDE 0.9 % IV SOLN
80.0000 mg | Freq: Once | INTRAVENOUS | Status: AC
  Administered 2017-11-10: 80 mg via INTRAVENOUS
  Filled 2017-11-10: qty 2

## 2017-11-10 MED ORDER — ACETAMINOPHEN 500 MG PO TABS
1000.0000 mg | ORAL_TABLET | Freq: Once | ORAL | Status: AC
  Administered 2017-11-10: 1000 mg via ORAL

## 2017-11-12 ENCOUNTER — Inpatient Hospital Stay: Payer: Medicare Other

## 2017-11-18 DIAGNOSIS — E782 Mixed hyperlipidemia: Secondary | ICD-10-CM | POA: Diagnosis not present

## 2017-11-18 DIAGNOSIS — N189 Chronic kidney disease, unspecified: Secondary | ICD-10-CM | POA: Diagnosis not present

## 2017-11-18 DIAGNOSIS — I517 Cardiomegaly: Secondary | ICD-10-CM | POA: Diagnosis not present

## 2017-11-18 DIAGNOSIS — E7521 Fabry (-Anderson) disease: Secondary | ICD-10-CM | POA: Diagnosis not present

## 2017-11-24 ENCOUNTER — Inpatient Hospital Stay: Payer: Medicare Other

## 2017-11-24 VITALS — BP 124/71 | HR 65 | Temp 97.7°F | Resp 18 | Wt 200.8 lb

## 2017-11-24 DIAGNOSIS — E7521 Fabry (-Anderson) disease: Secondary | ICD-10-CM | POA: Diagnosis not present

## 2017-11-24 MED ORDER — SODIUM CHLORIDE 0.9 % IV SOLN
90.0000 mg | Freq: Once | INTRAVENOUS | Status: AC
  Administered 2017-11-24: 90 mg via INTRAVENOUS
  Filled 2017-11-24: qty 14

## 2017-11-24 MED ORDER — SODIUM CHLORIDE 0.9 % IV SOLN
Freq: Once | INTRAVENOUS | Status: AC
  Administered 2017-11-24: 10:00:00 via INTRAVENOUS
  Filled 2017-11-24: qty 250

## 2017-11-24 MED ORDER — ACETAMINOPHEN 500 MG PO TABS
1000.0000 mg | ORAL_TABLET | Freq: Once | ORAL | Status: AC
  Administered 2017-11-24: 1000 mg via ORAL
  Filled 2017-11-24: qty 2

## 2017-11-26 ENCOUNTER — Inpatient Hospital Stay: Payer: Medicare Other

## 2017-11-30 ENCOUNTER — Ambulatory Visit: Payer: Self-pay | Admitting: Family Medicine

## 2017-12-08 ENCOUNTER — Inpatient Hospital Stay: Payer: Medicare Other | Attending: Oncology

## 2017-12-08 VITALS — BP 144/84 | HR 73 | Temp 97.5°F | Resp 18

## 2017-12-08 DIAGNOSIS — E7521 Fabry (-Anderson) disease: Secondary | ICD-10-CM | POA: Diagnosis not present

## 2017-12-08 MED ORDER — SODIUM CHLORIDE 0.9 % IV SOLN
90.0000 mg | Freq: Once | INTRAVENOUS | Status: AC
  Administered 2017-12-08: 90 mg via INTRAVENOUS
  Filled 2017-12-08: qty 14

## 2017-12-08 MED ORDER — SODIUM CHLORIDE 0.9 % IV SOLN
Freq: Once | INTRAVENOUS | Status: AC
  Administered 2017-12-08: 10:00:00 via INTRAVENOUS
  Filled 2017-12-08: qty 250

## 2017-12-08 MED ORDER — ACETAMINOPHEN 500 MG PO TABS
1000.0000 mg | ORAL_TABLET | Freq: Once | ORAL | Status: AC
  Administered 2017-12-08: 1000 mg via ORAL
  Filled 2017-12-08: qty 2

## 2017-12-08 NOTE — Patient Instructions (Signed)
Agalsidase Beta injection What is this medicine? AGALSIDASE BETA is used to replace an enzyme that is missing in patients with Fabry disease. It is not a cure. This medicine may be used for other purposes; ask your health care provider or pharmacist if you have questions. COMMON BRAND NAME(S): Fabrazyme What should I tell my health care provider before I take this medicine? They need to know if you have any of these conditions: -heart disease -an unusual or allergic reaction to agalsidase beta, mannitol, other medicines, foods, dyes, or preservatives -pregnant or trying to get pregnant -breast-feeding How should I use this medicine? This medicine is for infusion into a vein. It is given by a health care professional in a hospital or clinic setting. Talk to your pediatrician regarding the use of this medicine in children. Special care may be needed. Overdosage: If you think you have taken too much of this medicine contact a poison control center or emergency room at once. NOTE: This medicine is only for you. Do not share this medicine with others. What if I miss a dose? It is important not to miss your dose. Call your doctor or health care professional if you are unable to keep an appointment. What may interact with this medicine? -amiodarone -chloroquine -gentamicin -hydroxychloroquine -monobenzone This list may not describe all possible interactions. Give your health care provider a list of all the medicines, herbs, non-prescription drugs, or dietary supplements you use. Also tell them if you smoke, drink alcohol, or use illegal drugs. Some items may interact with your medicine. What should I watch for while using this medicine? Visit your doctor or health care professional for regular checks on your progress. Tell your doctor or healthcare professional if your symptoms do not start to get better or if they get worse. There is a registry for patients with Fabry disease. The registry is  used to gather information about the disease and its effects. Talk to your health care provider if you would like to join the registry. What side effects may I notice from receiving this medicine? Side effects that you should report to your doctor or health care professional as soon as possible: -allergic reactions like skin rash, itching or hives, swelling of the face, lips, or tongue -breathing problems -chest pain, tightness -depression -dizziness -fast, irregular heart beat -swelling of the arms or legs Side effects that usually do not require medical attention (report to your doctor or health care professional if they continue or are bothersome): -aches or pains -anxiety -fever or chills at the time of injection -headache -nausea, vomiting -stomach pain, upset This list may not describe all possible side effects. Call your doctor for medical advice about side effects. You may report side effects to FDA at 1-800-FDA-1088. Where should I keep my medicine? This drug is given in a hospital or clinic and will not be stored at home. NOTE: This sheet is a summary. It may not cover all possible information. If you have questions about this medicine, talk to your doctor, pharmacist, or health care provider.  2018 Elsevier/Gold Standard (2005-10-26 12:25:00)

## 2017-12-10 ENCOUNTER — Inpatient Hospital Stay: Payer: Medicare Other

## 2017-12-22 ENCOUNTER — Inpatient Hospital Stay: Payer: Medicare Other

## 2017-12-22 VITALS — BP 107/65 | HR 63 | Temp 96.4°F | Resp 18 | Wt 201.3 lb

## 2017-12-22 DIAGNOSIS — E7521 Fabry (-Anderson) disease: Secondary | ICD-10-CM

## 2017-12-22 MED ORDER — SODIUM CHLORIDE 0.9 % IV SOLN
Freq: Once | INTRAVENOUS | Status: AC
  Administered 2017-12-22: 09:00:00 via INTRAVENOUS
  Filled 2017-12-22: qty 250

## 2017-12-22 MED ORDER — SODIUM CHLORIDE 0.9 % IV SOLN
90.0000 mg | Freq: Once | INTRAVENOUS | Status: AC
  Administered 2017-12-22: 90 mg via INTRAVENOUS
  Filled 2017-12-22: qty 4

## 2017-12-22 MED ORDER — ACETAMINOPHEN 500 MG PO TABS
1000.0000 mg | ORAL_TABLET | Freq: Once | ORAL | Status: AC
  Administered 2017-12-22: 1000 mg via ORAL
  Filled 2017-12-22: qty 2

## 2017-12-22 NOTE — Progress Notes (Signed)
Patient reports he seems to be handling the dosage increase well. No added side effects noted.

## 2017-12-24 ENCOUNTER — Inpatient Hospital Stay: Payer: Medicare Other

## 2018-01-02 NOTE — Progress Notes (Signed)
[place]  Telephone:(336) [phone] Fax:(336) [phone]  ID: Jeffery Hughes OB: October 23, 1965  MR#: [id number]  JOI#:[id number]  Patient Care Team: [facility] as PCP - General (Family Medicine)  CHIEF COMPLAINT: Fabry disease requiring Fabrazyme infusion.  INTERVAL HISTORY: Patient returns to clinic today for routine six-month evaluation and continuation of Fabrazyme.  He currently feels well and is at his baseline.  He does not complain of weakness or fatigue today.  He continues to receive infusions every 2 weeks. He denies any new neurologic complaints.  He denies any recent fevers or illnesses.  He has a good appetite and denies weight loss.  He denies any pain.  He has no chest pain or shortness of breath.  He denies any nausea, vomiting, constipation, or diarrhea.  He has no urinary complaints.  Patient feels at his baseline offers no specific complaints today.  REVIEW OF SYSTEMS:   Review of Systems  Constitutional: Negative for fever, malaise/fatigue and weight loss.  Respiratory: Negative.  Negative for cough and shortness of breath.   Cardiovascular: Negative.  Negative for chest pain and leg swelling.  Gastrointestinal: Negative.  Negative for abdominal pain.  Genitourinary: Negative.  Negative for dysuria.  Musculoskeletal: Negative.  Negative for myalgias.  Skin: Negative.  Negative for rash.  Neurological: Negative.  Negative for sensory change, focal weakness, weakness and headaches.  Psychiatric/Behavioral: Negative.  Negative for depression. The patient is not nervous/anxious.     As per HPI. Otherwise, a complete review of systems is negative.  PAST MEDICAL HISTORY: Past Medical History:  Diagnosis Date  . CKD (chronic kidney disease), stage II    a. 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 Fabry disease ([place])    a. initially diagnosed in [place], [place] in ~ 2003 to 2004, previously treated with Fabrazyme  . Fabry disease ([place])   .  Hypertension   . LVH (left ventricular hypertrophy)   . Mixed hyperlipidemia   . Vitamin D deficiency     PAST SURGICAL HISTORY: Past Surgical History:  Procedure Laterality Date  . KNEE SURGERY Left    20+ years ago, 6+ years ago  . NO PAST SURGERIES      FAMILY HISTORY: Brother with Fabry's disease, otherwise negative.     ADVANCED DIRECTIVES:    HEALTH MAINTENANCE: Social History   Tobacco Use  . Smoking status: Never Smoker  . Smokeless tobacco: Never Used  Substance Use Topics  . Alcohol use: No    Alcohol/week: 0.0 standard drinks  . Drug use: No     Colonoscopy:  PAP:  Bone density:  Lipid panel:  Allergies  Allergen Reactions  . Other Other (See Comments)    Nephrologist recommended avoiding contrast.  . Penicillins Other (See Comments)    unknown    Current Outpatient Medications  Medication Sig Dispense Refill  . albuterol (PROVENTIL HFA;VENTOLIN HFA) 108 (90 BASE) MCG/ACT inhaler Inhale 2 puffs into the lungs every 6 (six) hours as needed for wheezing or shortness of breath. 1 Inhaler 2  . aspirin 81 MG tablet Take 81 mg by mouth daily.    . cyanocobalamin 1000 MCG tablet Take 100 mcg by mouth daily.    . diphenoxylate-atropine (LOMOTIL) 2.5-0.025 MG per tablet Take 2 tablets by mouth 4 (four) times daily as needed.     . furosemide (LASIX) 40 MG tablet Take 40 mg by mouth daily.    Marland Kitchen loratadine (CLARITIN) 10 MG tablet Take 10 mg by mouth.    Marland Kitchen  Multiple Vitamin (MULTIVITAMIN) tablet Take 1 tablet by mouth daily.    . potassium chloride SA (K-DUR,KLOR-CON) 20 MEQ tablet Take 20 mEq by mouth 2 (two) times daily.    . valsartan (DIOVAN) 160 MG tablet Take 160 mg by mouth daily. Pt takes 1 in the morning and 1/2 tablet every evening.    Marland Kitchen guaiFENesin-codeine 100-10 MG/5ML syrup Take 5 mLs by mouth every 4 (four) hours as needed. (Patient not taking: Reported on 03/17/2017) 120 mL 0  . loratadine (CLARITIN) 10 MG tablet Take 10 mg by mouth daily as  needed.      No current facility-administered medications for this visit.    Facility-Administered Medications Ordered in Other Visits  Medication Dose Route Frequency Provider Last Rate Last Dose  . acetaminophen (TYLENOL) tablet 1,000 mg  1,000 mg Oral Once Lloyd Huger, MD        OBJECTIVE: Vitals:   01/07/18 1011  BP: 139/81  Pulse: 81  Resp: 18  Temp: 97.8 F (36.6 C)     Body mass index is 28.07 kg/m.    ECOG FS:0 - Asymptomatic  General: Well-developed, well-nourished, no acute distress. Eyes: Pink conjunctiva, anicteric sclera. HEENT: Normocephalic, moist mucous membranes, clear oropharnyx. Lungs: Clear to auscultation bilaterally. Heart: Regular rate and rhythm. No rubs, murmurs, or gallops. Abdomen: Soft, nontender, nondistended. No organomegaly noted, normoactive bowel sounds. Musculoskeletal: No edema, cyanosis, or clubbing. Neuro: Alert, answering all questions appropriately. Cranial nerves grossly intact. Skin: No rashes or petechiae noted. Psych: Normal affect. Lymphatics: No cervical, calvicular, axillary or inguinal LAD.   LAB RESULTS:  Lab Results  Component Value Date   NA 133 (L) 05/21/2015   K 3.1 (L) 05/21/2015   CL 109 05/21/2015   CO2 17 (L) 05/21/2015   GLUCOSE 96 05/21/2015   BUN 16 05/21/2015   CREATININE 1.62 (H) 05/21/2015   CALCIUM 9.0 05/21/2015   PROT 7.7 05/21/2015   ALBUMIN 3.7 05/21/2015   AST 55 (H) 05/21/2015   ALT 86 (H) 05/21/2015   ALKPHOS 62 05/21/2015   BILITOT 0.8 05/21/2015   GFRNONAA 48 (L) 05/21/2015   GFRAA 55 (L) 05/21/2015    Lab Results  Component Value Date   WBC 7.0 05/21/2015   HGB 14.7 05/21/2015   HCT 43.4 05/21/2015   MCV 85.9 05/21/2015   PLT 370 05/21/2015     STUDIES: No results found.  ASSESSMENT: Fabry disease requiring Fabrazyme infusion.  PLAN:    1. Fabry disease requiring Fabrazyme infusion: Continue 1 mg/kg of Fabrazyme every 2 weeks indefinitely.  He also receives 1000 mg  Tylenol and 25 mg Benadryl approximately 30 minutes prior to each infusion. He expressed understanding that we can only provide his infusion.  Any laboratory work or questions regarding his disease or infusions must be directed towards to his primary geneticist or endocrinologist.  Return to clinic every 2 weeks for infusion and then in 6 months for routine evaluation. 2.  Chronic renal insufficiency: Chronic and unchanged.  Continue monitoring and treatment per nephrology.  I spent a total of 30 minutes face-to-face with the patient of which greater than 50% of the visit was spent in counseling and coordination of care as detailed above.   Patient expressed understanding and was in agreement with this plan. He also understands that He can call clinic at any time with any questions, concerns, or complaints.   PCP: Dr. Ernest Pine at [place]:  Dr. Karenann Cai at [facility],  MD   01/09/2018 8:38 AM

## 2018-01-05 DIAGNOSIS — Z23 Encounter for immunization: Secondary | ICD-10-CM | POA: Diagnosis not present

## 2018-01-07 ENCOUNTER — Inpatient Hospital Stay: Payer: Medicare Other

## 2018-01-07 ENCOUNTER — Inpatient Hospital Stay: Payer: Medicare Other | Attending: Oncology | Admitting: Oncology

## 2018-01-07 ENCOUNTER — Encounter: Payer: Self-pay | Admitting: Oncology

## 2018-01-07 VITALS — BP 120/79 | HR 71 | Temp 97.8°F | Resp 18

## 2018-01-07 VITALS — BP 139/81 | HR 81 | Temp 97.8°F | Resp 18 | Wt 201.3 lb

## 2018-01-07 DIAGNOSIS — E7521 Fabry (-Anderson) disease: Secondary | ICD-10-CM

## 2018-01-07 DIAGNOSIS — I129 Hypertensive chronic kidney disease with stage 1 through stage 4 chronic kidney disease, or unspecified chronic kidney disease: Secondary | ICD-10-CM | POA: Diagnosis not present

## 2018-01-07 DIAGNOSIS — N182 Chronic kidney disease, stage 2 (mild): Secondary | ICD-10-CM

## 2018-01-07 MED ORDER — ACETAMINOPHEN 500 MG PO TABS
1000.0000 mg | ORAL_TABLET | Freq: Once | ORAL | Status: AC
  Administered 2018-01-07: 1000 mg via ORAL

## 2018-01-07 MED ORDER — ACETAMINOPHEN 500 MG PO TABS
ORAL_TABLET | ORAL | Status: AC
  Filled 2018-01-07: qty 2

## 2018-01-07 MED ORDER — SODIUM CHLORIDE 0.9 % IV SOLN
90.0000 mg | Freq: Once | INTRAVENOUS | Status: AC
  Administered 2018-01-07: 90 mg via INTRAVENOUS
  Filled 2018-01-07: qty 4

## 2018-01-07 MED ORDER — SODIUM CHLORIDE 0.9 % IV SOLN
Freq: Once | INTRAVENOUS | Status: AC
  Administered 2018-01-07: 10:00:00 via INTRAVENOUS
  Filled 2018-01-07: qty 250

## 2018-01-07 NOTE — Progress Notes (Signed)
Patient denies any concerns today.  

## 2018-01-07 NOTE — Patient Instructions (Signed)
Agalsidase Beta injection What is this medicine? AGALSIDASE BETA is used to replace an enzyme that is missing in patients with Fabry disease. It is not a cure. This medicine may be used for other purposes; ask your health care provider or pharmacist if you have questions. COMMON BRAND NAME(S): Fabrazyme What should I tell my health care provider before I take this medicine? They need to know if you have any of these conditions: -heart disease -an unusual or allergic reaction to agalsidase beta, mannitol, other medicines, foods, dyes, or preservatives -pregnant or trying to get pregnant -breast-feeding How should I use this medicine? This medicine is for infusion into a vein. It is given by a health care professional in a hospital or clinic setting. Talk to your pediatrician regarding the use of this medicine in children. Special care may be needed. Overdosage: If you think you have taken too much of this medicine contact a poison control center or emergency room at once. NOTE: This medicine is only for you. Do not share this medicine with others. What if I miss a dose? It is important not to miss your dose. Call your doctor or health care professional if you are unable to keep an appointment. What may interact with this medicine? -amiodarone -chloroquine -gentamicin -hydroxychloroquine -monobenzone This list may not describe all possible interactions. Give your health care provider a list of all the medicines, herbs, non-prescription drugs, or dietary supplements you use. Also tell them if you smoke, drink alcohol, or use illegal drugs. Some items may interact with your medicine. What should I watch for while using this medicine? Visit your doctor or health care professional for regular checks on your progress. Tell your doctor or healthcare professional if your symptoms do not start to get better or if they get worse. There is a registry for patients with Fabry disease. The registry is  used to gather information about the disease and its effects. Talk to your health care provider if you would like to join the registry. What side effects may I notice from receiving this medicine? Side effects that you should report to your doctor or health care professional as soon as possible: -allergic reactions like skin rash, itching or hives, swelling of the face, lips, or tongue -breathing problems -chest pain, tightness -depression -dizziness -fast, irregular heart beat -swelling of the arms or legs Side effects that usually do not require medical attention (report to your doctor or health care professional if they continue or are bothersome): -aches or pains -anxiety -fever or chills at the time of injection -headache -nausea, vomiting -stomach pain, upset This list may not describe all possible side effects. Call your doctor for medical advice about side effects. You may report side effects to FDA at 1-800-FDA-1088. Where should I keep my medicine? This drug is given in a hospital or clinic and will not be stored at home. NOTE: This sheet is a summary. It may not cover all possible information. If you have questions about this medicine, talk to your doctor, pharmacist, or health care provider.  2018 Elsevier/Gold Standard (2005-10-26 12:25:00)

## 2018-01-09 DIAGNOSIS — N185 Chronic kidney disease, stage 5: Secondary | ICD-10-CM | POA: Insufficient documentation

## 2018-01-09 DIAGNOSIS — N184 Chronic kidney disease, stage 4 (severe): Secondary | ICD-10-CM | POA: Insufficient documentation

## 2018-01-09 DIAGNOSIS — I77810 Thoracic aortic ectasia: Secondary | ICD-10-CM | POA: Insufficient documentation

## 2018-01-09 DIAGNOSIS — N183 Chronic kidney disease, stage 3 (moderate): Secondary | ICD-10-CM

## 2018-01-09 NOTE — Progress Notes (Signed)
Cardiology Office Note  Date:  01/11/2018   ID:  Jeffery Hughes, DOB [birth year], MRN [id number]  PCP:  [facility]   Chief Complaint  Patient presents with  . OTHER    Per [facility] Fabry disease and LVH. Meds reviewed verbally with pt.    HPI:  Jeffery Hughes is a 52 y.o. male with  Fabry disease CKD, CR 1.7 Severe LVH Normal stress test 01/2015 He presents by referral from Dr. Sherryle Lis for consultation of his Fabry's disease, hypertrophy   Reports long history of Fabry's disease, diagnosed over 15 years ago For the past 15 years, has been receiving medication for the direct treatment of Fabry disease - Fabrazyme (agalsidase beta) - enzyme replacement therapy.  He is currently infused enzyme replacement therapy (ERT) at the dose of 80 mg per infusion every 2 weeks  Active at baseline, denies any significant shortness of breath or chest pain on exertion  Lab work reviewed with him abnormal lipid panel Total chol 255, LDL 125  followed by Dr. Holley Raring at [facility] for CKD/proteinuria  CR 1.6 in 05/2015, stable in 2019  Followed by Dr. Rob Hickman genetics clinic for Fabry Disease   Takes lasix 40 daily Baseline creatinine 1.7, denies any significant lower extremity edema  EKG personally reviewed by myself on todays visit Shows normal sinus rhythm rate 74 bpm, LVH with repolarization abnormality  Recent cardiac work-up includes cardiac MRI on 01/05/17 that showed  severe concentric LVH, right ventricle wall thickening, mildly increased aortic root. Details as below  severe concentric hypertrophy with a maximum wall thickness of 2.0 cm. The LV apex is particularly hypertrophied. Global systolic function is normal with a calculated LV ejection fraction of 60%.  diastolic dysfunction   2. The right ventricle is normal in cavity size.  mild hypertrophy of the RV wall (particularly the basal portion) with normal systolic  function.  3. Both atria are normal in size.  4. The aortic root is mildly increased in caliber at 3.9 cm in diameter. The aortic valve is trileaflet in morphology. There is no significant aortic valve stenosis or regurgitation. There are no other significant valvular lesions.  5. Delayed enhancement imaging is mildly abnormal.  consistent with that seen  with Fabry's disease.    PMH:   has a past medical history of CKD (chronic kidney disease), stage II, Fabry disease ([place]), Fabry disease ([place]), Hypertension, LVH (left ventricular hypertrophy), Mixed hyperlipidemia, and Vitamin D deficiency.  PSH:    Past Surgical History:  Procedure Laterality Date  . KNEE SURGERY Left    20+ years ago, 6+ years ago  . NO PAST SURGERIES      Current Outpatient Medications  Medication Sig Dispense Refill  . aspirin 81 MG tablet Take 81 mg by mouth daily.    . cyanocobalamin 1000 MCG tablet Take 100 mcg by mouth daily.    . diphenoxylate-atropine (LOMOTIL) 2.5-0.025 MG per tablet Take 2 tablets by mouth 4 (four) times daily as needed.     . furosemide (LASIX) 40 MG tablet Take 40 mg by mouth daily.    Marland Kitchen loratadine (CLARITIN) 10 MG tablet Take 10 mg by mouth.    . Multiple Vitamin (MULTIVITAMIN) tablet Take 1 tablet by mouth daily.    . potassium chloride SA (K-DUR,KLOR-CON) 20 MEQ tablet Take 20 mEq by mouth 2 (two) times daily.    . valsartan (DIOVAN) 160 MG tablet Take 160 mg by mouth daily. Pt takes 1  in the morning and 1/2 tablet every evening.     No current facility-administered medications for this visit.    Facility-Administered Medications Ordered in Other Visits  Medication Dose Route Frequency Provider Last Rate Last Dose  . acetaminophen (TYLENOL) tablet 1,000 mg  1,000 mg Oral Once Lloyd Huger, MD         Allergies:   Other and Penicillins   Social History:  The patient  reports that he has never smoked. He has never used smokeless tobacco. He reports that he does not  drink alcohol or use drugs.   Family History:   family history includes CAD in his father and mother; Heart attack in his mother; Skin cancer in his father; Stroke in his mother.    Review of Systems: Review of Systems  Constitutional: Negative.   Respiratory: Negative.   Cardiovascular: Negative.   Gastrointestinal: Negative.   Musculoskeletal: Negative.   Neurological: Negative.   Psychiatric/Behavioral: Negative.   All other systems reviewed and are negative.    PHYSICAL EXAM: VS:  BP (!) 136/93 (BP Location: Right Arm, Patient Position: Sitting, Cuff Size: Normal)   Pulse 74   Ht 5\' 9"  (1.753 m)   Wt 202 lb 8 oz (91.9 kg)   BMI 29.90 kg/m  , BMI Body mass index is 29.9 kg/m. GEN: Well nourished, well developed, in no acute distress  HEENT: normal  Neck: no JVD, carotid bruits, or masses Cardiac: RRR; no murmurs, rubs, or gallops,no edema  Respiratory:  clear to auscultation bilaterally, normal work of breathing GI: soft, nontender, nondistended, + BS MS: no deformity or atrophy  Skin: warm and dry, no rash Neuro:  Strength and sensation are intact Psych: euthymic mood, full affect   Recent Labs: No results found for requested labs within last 8760 hours.    Lipid Panel No results found for: CHOL, HDL, LDLCALC, TRIG    Wt Readings from Last 3 Encounters:  01/11/18 202 lb 8 oz (91.9 kg)  01/07/18 201 lb 4.5 oz (91.3 kg)  12/22/17 201 lb 4.5 oz (91.3 kg)       ASSESSMENT AND PLAN:  Fabry disease ([place]) Long history over 15 years, receiving medication for the direct treatment of Fabry disease Denies significant manifestations of the disease Apart from chronic kidney disease, cardiac disease as detailed above  Ascending aorta dilation ([place]) - Plan: EKG 12-Lead 3.9 cm, will need periodic monitoring  CKD (chronic kidney disease) stage 3, GFR 30-59 ml/min (HCC) Stable creatinine 1.7 Denies lower extremity edema, mouth always feels dry Recommended he try  Lasix 40 alternating with 20 mg Suggested he discuss slight decrease in his diuretics with nephrology  Hyperlipidemia Total cholesterol 250 on average No mention of coronary calcification or aortic atherosclerosis on recent MRI He is trying to watch his diet, exercise  Severe LVH Reports he is asymptomatic Stressed the importance of blood pressure control, management of heart rate Currently not on any medications for rate control Given is asymptomatic, no medication started Could consider low-dose beta-blockers or calcium channel blockers in the future  Disposition:   F/U  12 months   Total encounter time more than 60 minutes  Greater than 50% was spent in counseling and coordination of care with the patient  Patient seen in consultation for Dr. Sherryle Lis will will be referred back to her office for ongoing care of the issues detailed above  Orders Placed This Encounter  Procedures  . EKG 12-Lead     Signed, Esmond Plants,  M.D., Ph.D. 01/11/2018  [place], [place]

## 2018-01-11 ENCOUNTER — Encounter: Payer: Self-pay | Admitting: Cardiovascular Disease

## 2018-01-11 ENCOUNTER — Ambulatory Visit (INDEPENDENT_AMBULATORY_CARE_PROVIDER_SITE_OTHER): Payer: Medicare Other | Admitting: Cardiovascular Disease

## 2018-01-11 VITALS — BP 136/93 | HR 74 | Ht 69.0 in | Wt 202.5 lb

## 2018-01-11 DIAGNOSIS — E7521 Fabry (-Anderson) disease: Secondary | ICD-10-CM | POA: Diagnosis not present

## 2018-01-11 DIAGNOSIS — N183 Chronic kidney disease, stage 3 unspecified: Secondary | ICD-10-CM

## 2018-01-11 DIAGNOSIS — E781 Pure hyperglyceridemia: Secondary | ICD-10-CM

## 2018-01-11 DIAGNOSIS — I77810 Thoracic aortic ectasia: Secondary | ICD-10-CM

## 2018-01-11 NOTE — Patient Instructions (Signed)

## 2018-01-21 ENCOUNTER — Inpatient Hospital Stay: Payer: Medicare Other

## 2018-01-21 VITALS — BP 130/72 | HR 64 | Temp 97.5°F | Resp 18 | Wt 204.4 lb

## 2018-01-21 DIAGNOSIS — E7521 Fabry (-Anderson) disease: Secondary | ICD-10-CM | POA: Diagnosis not present

## 2018-01-21 MED ORDER — SODIUM CHLORIDE 0.9 % IV SOLN
90.0000 mg | Freq: Once | INTRAVENOUS | Status: DC
  Filled 2018-01-21: qty 18

## 2018-01-21 MED ORDER — ACETAMINOPHEN 500 MG PO TABS
1000.0000 mg | ORAL_TABLET | Freq: Once | ORAL | Status: AC
  Administered 2018-01-21: 1000 mg via ORAL
  Filled 2018-01-21: qty 2

## 2018-01-21 MED ORDER — SODIUM CHLORIDE 0.9 % IV SOLN
90.0000 mg | Freq: Once | INTRAVENOUS | Status: AC
  Administered 2018-01-21: 90 mg via INTRAVENOUS
  Filled 2018-01-21: qty 14

## 2018-01-21 MED ORDER — SODIUM CHLORIDE 0.9 % IV SOLN
Freq: Once | INTRAVENOUS | Status: AC
  Administered 2018-01-21: 09:00:00 via INTRAVENOUS
  Filled 2018-01-21: qty 250

## 2018-01-21 NOTE — Patient Instructions (Signed)
Agalsidase Beta injection What is this medicine? AGALSIDASE BETA is used to replace an enzyme that is missing in patients with Fabry disease. It is not a cure. This medicine may be used for other purposes; ask your health care provider or pharmacist if you have questions. COMMON BRAND NAME(S): Fabrazyme What should I tell my health care provider before I take this medicine? They need to know if you have any of these conditions: -heart disease -an unusual or allergic reaction to agalsidase beta, mannitol, other medicines, foods, dyes, or preservatives -pregnant or trying to get pregnant -breast-feeding How should I use this medicine? This medicine is for infusion into a vein. It is given by a health care professional in a hospital or clinic setting. Talk to your pediatrician regarding the use of this medicine in children. Special care may be needed. Overdosage: If you think you have taken too much of this medicine contact a poison control center or emergency room at once. NOTE: This medicine is only for you. Do not share this medicine with others. What if I miss a dose? It is important not to miss your dose. Call your doctor or health care professional if you are unable to keep an appointment. What may interact with this medicine? -amiodarone -chloroquine -gentamicin -hydroxychloroquine -monobenzone This list may not describe all possible interactions. Give your health care provider a list of all the medicines, herbs, non-prescription drugs, or dietary supplements you use. Also tell them if you smoke, drink alcohol, or use illegal drugs. Some items may interact with your medicine. What should I watch for while using this medicine? Visit your doctor or health care professional for regular checks on your progress. Tell your doctor or healthcare professional if your symptoms do not start to get better or if they get worse. There is a registry for patients with Fabry disease. The registry is  used to gather information about the disease and its effects. Talk to your health care provider if you would like to join the registry. What side effects may I notice from receiving this medicine? Side effects that you should report to your doctor or health care professional as soon as possible: -allergic reactions like skin rash, itching or hives, swelling of the face, lips, or tongue -breathing problems -chest pain, tightness -depression -dizziness -fast, irregular heart beat -swelling of the arms or legs Side effects that usually do not require medical attention (report to your doctor or health care professional if they continue or are bothersome): -aches or pains -anxiety -fever or chills at the time of injection -headache -nausea, vomiting -stomach pain, upset This list may not describe all possible side effects. Call your doctor for medical advice about side effects. You may report side effects to FDA at 1-800-FDA-1088. Where should I keep my medicine? This drug is given in a hospital or clinic and will not be stored at home. NOTE: This sheet is a summary. It may not cover all possible information. If you have questions about this medicine, talk to your doctor, pharmacist, or health care provider.  2018 Elsevier/Gold Standard (2005-10-26 12:25:00)

## 2018-01-26 ENCOUNTER — Inpatient Hospital Stay: Payer: Medicare Other

## 2018-02-02 ENCOUNTER — Inpatient Hospital Stay: Payer: Medicare Other | Attending: Oncology

## 2018-02-02 VITALS — BP 115/64 | HR 56 | Temp 97.6°F | Resp 18 | Wt 205.0 lb

## 2018-02-02 DIAGNOSIS — E7521 Fabry (-Anderson) disease: Secondary | ICD-10-CM | POA: Diagnosis not present

## 2018-02-02 MED ORDER — SODIUM CHLORIDE 0.9 % IV SOLN
90.0000 mg | Freq: Once | INTRAVENOUS | Status: AC
  Administered 2018-02-02: 90 mg via INTRAVENOUS
  Filled 2018-02-02: qty 18

## 2018-02-02 MED ORDER — SODIUM CHLORIDE 0.9 % IV SOLN
Freq: Once | INTRAVENOUS | Status: AC
  Administered 2018-02-02: 10:00:00 via INTRAVENOUS
  Filled 2018-02-02: qty 250

## 2018-02-02 MED ORDER — ACETAMINOPHEN 500 MG PO TABS
1000.0000 mg | ORAL_TABLET | Freq: Once | ORAL | Status: AC
  Administered 2018-02-02: 1000 mg via ORAL
  Filled 2018-02-02: qty 2

## 2018-02-09 ENCOUNTER — Inpatient Hospital Stay: Payer: Medicare Other

## 2018-02-14 DIAGNOSIS — N183 Chronic kidney disease, stage 3 (moderate): Secondary | ICD-10-CM | POA: Diagnosis not present

## 2018-02-14 DIAGNOSIS — R600 Localized edema: Secondary | ICD-10-CM | POA: Diagnosis not present

## 2018-02-14 DIAGNOSIS — E7521 Fabry (-Anderson) disease: Secondary | ICD-10-CM | POA: Diagnosis not present

## 2018-02-14 DIAGNOSIS — R809 Proteinuria, unspecified: Secondary | ICD-10-CM | POA: Diagnosis not present

## 2018-02-16 ENCOUNTER — Inpatient Hospital Stay: Payer: Medicare Other

## 2018-02-16 VITALS — BP 127/85 | HR 60 | Temp 96.5°F | Resp 18

## 2018-02-16 DIAGNOSIS — E7521 Fabry (-Anderson) disease: Secondary | ICD-10-CM | POA: Diagnosis not present

## 2018-02-16 MED ORDER — SODIUM CHLORIDE 0.9 % IV SOLN
90.0000 mg | Freq: Once | INTRAVENOUS | Status: AC
  Administered 2018-02-16: 90 mg via INTRAVENOUS
  Filled 2018-02-16: qty 14

## 2018-02-16 MED ORDER — SODIUM CHLORIDE 0.9 % IV SOLN
Freq: Once | INTRAVENOUS | Status: AC
  Administered 2018-02-16: 10:00:00 via INTRAVENOUS
  Filled 2018-02-16: qty 250

## 2018-02-16 MED ORDER — ACETAMINOPHEN 500 MG PO TABS
1000.0000 mg | ORAL_TABLET | Freq: Once | ORAL | Status: AC
  Administered 2018-02-16: 1000 mg via ORAL
  Filled 2018-02-16: qty 2

## 2018-02-16 NOTE — Patient Instructions (Signed)
Agalsidase Beta injection  What is this medicine?  AGALSIDASE BETA is used to replace an enzyme that is missing in patients with Fabry disease. It is not a cure.  This medicine may be used for other purposes; ask your health care provider or pharmacist if you have questions.  COMMON BRAND NAME(S): Fabrazyme  What should I tell my health care provider before I take this medicine?  They need to know if you have any of these conditions:  -heart disease  -an unusual or allergic reaction to agalsidase beta, mannitol, other medicines, foods, dyes, or preservatives  -pregnant or trying to get pregnant  -breast-feeding  How should I use this medicine?  This medicine is for infusion into a vein. It is given by a health care professional in a hospital or clinic setting.  Talk to your pediatrician regarding the use of this medicine in children. Special care may be needed.  Overdosage: If you think you have taken too much of this medicine contact a poison control center or emergency room at once.  NOTE: This medicine is only for you. Do not share this medicine with others.  What if I miss a dose?  It is important not to miss your dose. Call your doctor or health care professional if you are unable to keep an appointment.  What may interact with this medicine?  -amiodarone  -chloroquine  -gentamicin  -hydroxychloroquine  -monobenzone  This list may not describe all possible interactions. Give your health care provider a list of all the medicines, herbs, non-prescription drugs, or dietary supplements you use. Also tell them if you smoke, drink alcohol, or use illegal drugs. Some items may interact with your medicine.  What should I watch for while using this medicine?  Visit your doctor or health care professional for regular checks on your progress. Tell your doctor or healthcare professional if your symptoms do not start to get better or if they get worse.  There is a registry for patients with Fabry disease. The registry is  used to gather information about the disease and its effects. Talk to your health care provider if you would like to join the registry.  What side effects may I notice from receiving this medicine?  Side effects that you should report to your doctor or health care professional as soon as possible:  -allergic reactions like skin rash, itching or hives, swelling of the face, lips, or tongue  -breathing problems  -chest pain, tightness  -depression  -dizziness  -fast, irregular heart beat  -swelling of the arms or legs  Side effects that usually do not require medical attention (report to your doctor or health care professional if they continue or are bothersome):  -aches or pains  -anxiety  -fever or chills at the time of injection  -headache  -nausea, vomiting  -stomach pain, upset  This list may not describe all possible side effects. Call your doctor for medical advice about side effects. You may report side effects to FDA at 1-800-FDA-1088.  Where should I keep my medicine?  This drug is given in a hospital or clinic and will not be stored at home.  NOTE: This sheet is a summary. It may not cover all possible information. If you have questions about this medicine, talk to your doctor, pharmacist, or health care provider.   2019 Elsevier/Gold Standard (2005-10-26 12:25:00)

## 2018-02-24 ENCOUNTER — Inpatient Hospital Stay: Payer: Medicare Other

## 2018-03-03 ENCOUNTER — Inpatient Hospital Stay: Payer: Medicare Other | Attending: Oncology

## 2018-03-03 VITALS — BP 129/90 | HR 65 | Temp 97.7°F | Resp 18 | Wt 201.1 lb

## 2018-03-03 DIAGNOSIS — E7521 Fabry (-Anderson) disease: Secondary | ICD-10-CM | POA: Insufficient documentation

## 2018-03-03 MED ORDER — ACETAMINOPHEN 500 MG PO TABS
1000.0000 mg | ORAL_TABLET | Freq: Once | ORAL | Status: AC
  Administered 2018-03-03: 1000 mg via ORAL
  Filled 2018-03-03: qty 2

## 2018-03-03 MED ORDER — SODIUM CHLORIDE 0.9 % IV SOLN
90.0000 mg | Freq: Once | INTRAVENOUS | Status: AC
  Administered 2018-03-03: 90 mg via INTRAVENOUS
  Filled 2018-03-03: qty 14

## 2018-03-03 MED ORDER — SODIUM CHLORIDE 0.9 % IV SOLN
Freq: Once | INTRAVENOUS | Status: AC
  Administered 2018-03-03: 10:00:00 via INTRAVENOUS
  Filled 2018-03-03: qty 250

## 2018-03-03 NOTE — Patient Instructions (Signed)
Agalsidase Beta injection  What is this medicine?  AGALSIDASE BETA is used to replace an enzyme that is missing in patients with Fabry disease. It is not a cure.  This medicine may be used for other purposes; ask your health care provider or pharmacist if you have questions.  COMMON BRAND NAME(S): Fabrazyme  What should I tell my health care provider before I take this medicine?  They need to know if you have any of these conditions:  -heart disease  -an unusual or allergic reaction to agalsidase beta, mannitol, other medicines, foods, dyes, or preservatives  -pregnant or trying to get pregnant  -breast-feeding  How should I use this medicine?  This medicine is for infusion into a vein. It is given by a health care professional in a hospital or clinic setting.  Talk to your pediatrician regarding the use of this medicine in children. Special care may be needed.  Overdosage: If you think you have taken too much of this medicine contact a poison control center or emergency room at once.  NOTE: This medicine is only for you. Do not share this medicine with others.  What if I miss a dose?  It is important not to miss your dose. Call your doctor or health care professional if you are unable to keep an appointment.  What may interact with this medicine?  -amiodarone  -chloroquine  -gentamicin  -hydroxychloroquine  -monobenzone  This list may not describe all possible interactions. Give your health care provider a list of all the medicines, herbs, non-prescription drugs, or dietary supplements you use. Also tell them if you smoke, drink alcohol, or use illegal drugs. Some items may interact with your medicine.  What should I watch for while using this medicine?  Visit your doctor or health care professional for regular checks on your progress. Tell your doctor or healthcare professional if your symptoms do not start to get better or if they get worse.  There is a registry for patients with Fabry disease. The registry is  used to gather information about the disease and its effects. Talk to your health care provider if you would like to join the registry.  What side effects may I notice from receiving this medicine?  Side effects that you should report to your doctor or health care professional as soon as possible:  -allergic reactions like skin rash, itching or hives, swelling of the face, lips, or tongue  -breathing problems  -chest pain, tightness  -depression  -dizziness  -fast, irregular heart beat  -swelling of the arms or legs  Side effects that usually do not require medical attention (report to your doctor or health care professional if they continue or are bothersome):  -aches or pains  -anxiety  -fever or chills at the time of injection  -headache  -nausea, vomiting  -stomach pain, upset  This list may not describe all possible side effects. Call your doctor for medical advice about side effects. You may report side effects to FDA at 1-800-FDA-1088.  Where should I keep my medicine?  This drug is given in a hospital or clinic and will not be stored at home.  NOTE: This sheet is a summary. It may not cover all possible information. If you have questions about this medicine, talk to your doctor, pharmacist, or health care provider.   2019 Elsevier/Gold Standard (2005-10-26 12:25:00)

## 2018-03-09 ENCOUNTER — Inpatient Hospital Stay: Payer: Medicare Other

## 2018-03-16 ENCOUNTER — Inpatient Hospital Stay: Payer: Medicare Other

## 2018-03-16 VITALS — BP 123/78 | HR 58 | Temp 94.6°F | Resp 18 | Wt 203.6 lb

## 2018-03-16 DIAGNOSIS — E7521 Fabry (-Anderson) disease: Secondary | ICD-10-CM | POA: Diagnosis not present

## 2018-03-16 MED ORDER — SODIUM CHLORIDE 0.9 % IV SOLN
90.0000 mg | Freq: Once | INTRAVENOUS | Status: AC
  Administered 2018-03-16: 90 mg via INTRAVENOUS
  Filled 2018-03-16: qty 4

## 2018-03-16 MED ORDER — ACETAMINOPHEN 500 MG PO TABS
1000.0000 mg | ORAL_TABLET | Freq: Once | ORAL | Status: AC
  Administered 2018-03-16: 1000 mg via ORAL
  Filled 2018-03-16: qty 2

## 2018-03-16 MED ORDER — SODIUM CHLORIDE 0.9 % IV SOLN
Freq: Once | INTRAVENOUS | Status: AC
  Administered 2018-03-16: 10:00:00 via INTRAVENOUS
  Filled 2018-03-16: qty 250

## 2018-03-16 NOTE — Patient Instructions (Signed)
Agalsidase Beta injection  What is this medicine?  AGALSIDASE BETA is used to replace an enzyme that is missing in patients with Fabry disease. It is not a cure.  This medicine may be used for other purposes; ask your health care provider or pharmacist if you have questions.  COMMON BRAND NAME(S): Fabrazyme  What should I tell my health care provider before I take this medicine?  They need to know if you have any of these conditions:  -heart disease  -an unusual or allergic reaction to agalsidase beta, mannitol, other medicines, foods, dyes, or preservatives  -pregnant or trying to get pregnant  -breast-feeding  How should I use this medicine?  This medicine is for infusion into a vein. It is given by a health care professional in a hospital or clinic setting.  Talk to your pediatrician regarding the use of this medicine in children. Special care may be needed.  Overdosage: If you think you have taken too much of this medicine contact a poison control center or emergency room at once.  NOTE: This medicine is only for you. Do not share this medicine with others.  What if I miss a dose?  It is important not to miss your dose. Call your doctor or health care professional if you are unable to keep an appointment.  What may interact with this medicine?  -amiodarone  -chloroquine  -gentamicin  -hydroxychloroquine  -monobenzone  This list may not describe all possible interactions. Give your health care provider a list of all the medicines, herbs, non-prescription drugs, or dietary supplements you use. Also tell them if you smoke, drink alcohol, or use illegal drugs. Some items may interact with your medicine.  What should I watch for while using this medicine?  Visit your doctor or health care professional for regular checks on your progress. Tell your doctor or healthcare professional if your symptoms do not start to get better or if they get worse.  There is a registry for patients with Fabry disease. The registry is  used to gather information about the disease and its effects. Talk to your health care provider if you would like to join the registry.  What side effects may I notice from receiving this medicine?  Side effects that you should report to your doctor or health care professional as soon as possible:  -allergic reactions like skin rash, itching or hives, swelling of the face, lips, or tongue  -breathing problems  -chest pain, tightness  -depression  -dizziness  -fast, irregular heart beat  -swelling of the arms or legs  Side effects that usually do not require medical attention (report to your doctor or health care professional if they continue or are bothersome):  -aches or pains  -anxiety  -fever or chills at the time of injection  -headache  -nausea, vomiting  -stomach pain, upset  This list may not describe all possible side effects. Call your doctor for medical advice about side effects. You may report side effects to FDA at 1-800-FDA-1088.  Where should I keep my medicine?  This drug is given in a hospital or clinic and will not be stored at home.  NOTE: This sheet is a summary. It may not cover all possible information. If you have questions about this medicine, talk to your doctor, pharmacist, or health care provider.   2019 Elsevier/Gold Standard (2005-10-26 12:25:00)

## 2018-03-23 ENCOUNTER — Inpatient Hospital Stay: Payer: Medicare Other

## 2018-03-30 ENCOUNTER — Inpatient Hospital Stay: Payer: Medicare Other

## 2018-03-30 VITALS — BP 129/79 | HR 61 | Temp 96.1°F | Resp 18 | Wt 204.1 lb

## 2018-03-30 DIAGNOSIS — E7521 Fabry (-Anderson) disease: Secondary | ICD-10-CM | POA: Diagnosis not present

## 2018-03-30 MED ORDER — SODIUM CHLORIDE 0.9 % IV SOLN
Freq: Once | INTRAVENOUS | Status: AC
  Administered 2018-03-30: 10:00:00 via INTRAVENOUS
  Filled 2018-03-30: qty 250

## 2018-03-30 MED ORDER — SODIUM CHLORIDE 0.9 % IV SOLN
90.0000 mg | Freq: Once | INTRAVENOUS | Status: AC
  Administered 2018-03-30: 90 mg via INTRAVENOUS
  Filled 2018-03-30: qty 14

## 2018-03-30 MED ORDER — ACETAMINOPHEN 500 MG PO TABS
1000.0000 mg | ORAL_TABLET | Freq: Once | ORAL | Status: AC
  Administered 2018-03-30: 1000 mg via ORAL
  Filled 2018-03-30: qty 2

## 2018-03-30 NOTE — Patient Instructions (Signed)
Agalsidase Beta injection  What is this medicine?  AGALSIDASE BETA is used to replace an enzyme that is missing in patients with Fabry disease. It is not a cure.  This medicine may be used for other purposes; ask your health care provider or pharmacist if you have questions.  COMMON BRAND NAME(S): Fabrazyme  What should I tell my health care provider before I take this medicine?  They need to know if you have any of these conditions:  -heart disease  -an unusual or allergic reaction to agalsidase beta, mannitol, other medicines, foods, dyes, or preservatives  -pregnant or trying to get pregnant  -breast-feeding  How should I use this medicine?  This medicine is for infusion into a vein. It is given by a health care professional in a hospital or clinic setting.  Talk to your pediatrician regarding the use of this medicine in children. Special care may be needed.  Overdosage: If you think you have taken too much of this medicine contact a poison control center or emergency room at once.  NOTE: This medicine is only for you. Do not share this medicine with others.  What if I miss a dose?  It is important not to miss your dose. Call your doctor or health care professional if you are unable to keep an appointment.  What may interact with this medicine?  -amiodarone  -chloroquine  -gentamicin  -hydroxychloroquine  -monobenzone  This list may not describe all possible interactions. Give your health care provider a list of all the medicines, herbs, non-prescription drugs, or dietary supplements you use. Also tell them if you smoke, drink alcohol, or use illegal drugs. Some items may interact with your medicine.  What should I watch for while using this medicine?  Visit your doctor or health care professional for regular checks on your progress. Tell your doctor or healthcare professional if your symptoms do not start to get better or if they get worse.  There is a registry for patients with Fabry disease. The registry is  used to gather information about the disease and its effects. Talk to your health care provider if you would like to join the registry.  What side effects may I notice from receiving this medicine?  Side effects that you should report to your doctor or health care professional as soon as possible:  -allergic reactions like skin rash, itching or hives, swelling of the face, lips, or tongue  -breathing problems  -chest pain, tightness  -depression  -dizziness  -fast, irregular heart beat  -swelling of the arms or legs  Side effects that usually do not require medical attention (report to your doctor or health care professional if they continue or are bothersome):  -aches or pains  -anxiety  -fever or chills at the time of injection  -headache  -nausea, vomiting  -stomach pain, upset  This list may not describe all possible side effects. Call your doctor for medical advice about side effects. You may report side effects to FDA at 1-800-FDA-1088.  Where should I keep my medicine?  This drug is given in a hospital or clinic and will not be stored at home.  NOTE: This sheet is a summary. It may not cover all possible information. If you have questions about this medicine, talk to your doctor, pharmacist, or health care provider.   2019 Elsevier/Gold Standard (2005-10-26 12:25:00)

## 2018-04-06 ENCOUNTER — Inpatient Hospital Stay: Payer: Medicare Other

## 2018-04-13 ENCOUNTER — Inpatient Hospital Stay: Payer: Medicare Other | Attending: Oncology

## 2018-04-13 VITALS — BP 125/83 | HR 62 | Temp 96.0°F | Resp 18 | Wt 202.6 lb

## 2018-04-13 DIAGNOSIS — E7521 Fabry (-Anderson) disease: Secondary | ICD-10-CM | POA: Insufficient documentation

## 2018-04-13 MED ORDER — ACETAMINOPHEN 500 MG PO TABS
1000.0000 mg | ORAL_TABLET | Freq: Once | ORAL | Status: AC
  Administered 2018-04-13: 1000 mg via ORAL
  Filled 2018-04-13: qty 2

## 2018-04-13 MED ORDER — SODIUM CHLORIDE 0.9 % IV SOLN
Freq: Once | INTRAVENOUS | Status: AC
  Administered 2018-04-13: 10:00:00 via INTRAVENOUS
  Filled 2018-04-13: qty 250

## 2018-04-13 MED ORDER — SODIUM CHLORIDE 0.9 % IV SOLN
90.0000 mg | Freq: Once | INTRAVENOUS | Status: AC
  Administered 2018-04-13: 90 mg via INTRAVENOUS
  Filled 2018-04-13: qty 14

## 2018-04-20 ENCOUNTER — Inpatient Hospital Stay: Payer: Medicare Other

## 2018-04-27 ENCOUNTER — Inpatient Hospital Stay: Payer: Medicare Other

## 2018-04-27 VITALS — BP 133/73 | HR 59 | Temp 97.7°F | Resp 18

## 2018-04-27 DIAGNOSIS — E7521 Fabry (-Anderson) disease: Secondary | ICD-10-CM | POA: Diagnosis not present

## 2018-04-27 MED ORDER — SODIUM CHLORIDE 0.9 % IV SOLN: 1.0000 mg/kg | Freq: Once | INTRAVENOUS | Status: DC

## 2018-04-27 MED ORDER — ACETAMINOPHEN 500 MG PO TABS
1000.0000 mg | ORAL_TABLET | Freq: Once | ORAL | Status: AC
  Administered 2018-04-27: 1000 mg via ORAL
  Filled 2018-04-27: qty 2

## 2018-04-27 MED ORDER — SODIUM CHLORIDE 0.9 % IV SOLN: 90.0000 mg | Freq: Once | INTRAVENOUS | Status: DC

## 2018-04-27 MED ORDER — SODIUM CHLORIDE 0.9 % IV SOLN
90.0000 mg | Freq: Once | INTRAVENOUS | Status: DC
  Filled 2018-04-27: qty 18

## 2018-04-27 MED ORDER — SODIUM CHLORIDE 0.9 % IV SOLN
Freq: Once | INTRAVENOUS | Status: AC
  Administered 2018-04-27: 10:00:00 via INTRAVENOUS
  Filled 2018-04-27: qty 14

## 2018-04-27 MED ORDER — SODIUM CHLORIDE 0.9 % IV SOLN
Freq: Once | INTRAVENOUS | Status: AC
  Administered 2018-04-27: 10:00:00 via INTRAVENOUS
  Filled 2018-04-27: qty 250

## 2018-04-27 NOTE — Patient Instructions (Signed)
Agalsidase Beta injection  What is this medicine?  AGALSIDASE BETA is used to replace an enzyme that is missing in patients with Fabry disease. It is not a cure.  This medicine may be used for other purposes; ask your health care provider or pharmacist if you have questions.  COMMON BRAND NAME(S): Fabrazyme  What should I tell my health care provider before I take this medicine?  They need to know if you have any of these conditions:  -heart disease  -an unusual or allergic reaction to agalsidase beta, mannitol, other medicines, foods, dyes, or preservatives  -pregnant or trying to get pregnant  -breast-feeding  How should I use this medicine?  This medicine is for infusion into a vein. It is given by a health care professional in a hospital or clinic setting.  Talk to your pediatrician regarding the use of this medicine in children. Special care may be needed.  Overdosage: If you think you have taken too much of this medicine contact a poison control center or emergency room at once.  NOTE: This medicine is only for you. Do not share this medicine with others.  What if I miss a dose?  It is important not to miss your dose. Call your doctor or health care professional if you are unable to keep an appointment.  What may interact with this medicine?  -amiodarone  -chloroquine  -gentamicin  -hydroxychloroquine  -monobenzone  This list may not describe all possible interactions. Give your health care provider a list of all the medicines, herbs, non-prescription drugs, or dietary supplements you use. Also tell them if you smoke, drink alcohol, or use illegal drugs. Some items may interact with your medicine.  What should I watch for while using this medicine?  Visit your doctor or health care professional for regular checks on your progress. Tell your doctor or healthcare professional if your symptoms do not start to get better or if they get worse.  There is a registry for patients with Fabry disease. The registry is  used to gather information about the disease and its effects. Talk to your health care provider if you would like to join the registry.  What side effects may I notice from receiving this medicine?  Side effects that you should report to your doctor or health care professional as soon as possible:  -allergic reactions like skin rash, itching or hives, swelling of the face, lips, or tongue  -breathing problems  -chest pain, tightness  -depression  -dizziness  -fast, irregular heart beat  -swelling of the arms or legs  Side effects that usually do not require medical attention (report to your doctor or health care professional if they continue or are bothersome):  -aches or pains  -anxiety  -fever or chills at the time of injection  -headache  -nausea, vomiting  -stomach pain, upset  This list may not describe all possible side effects. Call your doctor for medical advice about side effects. You may report side effects to FDA at 1-800-FDA-1088.  Where should I keep my medicine?  This drug is given in a hospital or clinic and will not be stored at home.  NOTE: This sheet is a summary. It may not cover all possible information. If you have questions about this medicine, talk to your doctor, pharmacist, or health care provider.   2019 Elsevier/Gold Standard (2005-10-26 12:25:00)

## 2018-05-04 ENCOUNTER — Inpatient Hospital Stay: Payer: Medicare Other

## 2018-05-09 DIAGNOSIS — J069 Acute upper respiratory infection, unspecified: Secondary | ICD-10-CM | POA: Diagnosis not present

## 2018-05-11 ENCOUNTER — Inpatient Hospital Stay: Payer: Medicare Other

## 2018-05-18 ENCOUNTER — Other Ambulatory Visit: Payer: Self-pay

## 2018-05-18 ENCOUNTER — Inpatient Hospital Stay: Payer: Medicare Other | Attending: Hematology and Oncology

## 2018-05-18 ENCOUNTER — Inpatient Hospital Stay: Payer: Medicare Other

## 2018-05-18 VITALS — BP 160/90 | HR 65 | Temp 97.1°F | Resp 18 | Wt 201.5 lb

## 2018-05-18 DIAGNOSIS — E7521 Fabry (-Anderson) disease: Secondary | ICD-10-CM | POA: Diagnosis not present

## 2018-05-18 MED ORDER — SODIUM CHLORIDE 0.9 % IV SOLN
Freq: Once | INTRAVENOUS | Status: AC
  Administered 2018-05-18: 10:00:00 via INTRAVENOUS
  Filled 2018-05-18: qty 250

## 2018-05-18 MED ORDER — SODIUM CHLORIDE 0.9 % IV SOLN
90.0000 mg | Freq: Once | INTRAVENOUS | Status: AC
  Administered 2018-05-18: 90 mg via INTRAVENOUS
  Filled 2018-05-18: qty 14

## 2018-05-18 MED ORDER — ACETAMINOPHEN 500 MG PO TABS
1000.0000 mg | ORAL_TABLET | Freq: Once | ORAL | Status: AC
  Administered 2018-05-18: 1000 mg via ORAL
  Filled 2018-05-18: qty 2

## 2018-05-25 ENCOUNTER — Inpatient Hospital Stay: Payer: Medicare Other

## 2018-06-01 ENCOUNTER — Inpatient Hospital Stay: Payer: Medicare Other

## 2018-06-01 ENCOUNTER — Other Ambulatory Visit: Payer: Self-pay

## 2018-06-01 ENCOUNTER — Inpatient Hospital Stay: Payer: Medicare Other | Attending: Oncology

## 2018-06-01 VITALS — BP 164/97 | HR 61 | Temp 98.6°F | Resp 20

## 2018-06-01 DIAGNOSIS — E7521 Fabry (-Anderson) disease: Secondary | ICD-10-CM | POA: Insufficient documentation

## 2018-06-01 MED ORDER — SODIUM CHLORIDE 0.9 % IV SOLN
Freq: Once | INTRAVENOUS | Status: AC
  Administered 2018-06-01: 10:00:00 via INTRAVENOUS
  Filled 2018-06-01: qty 250

## 2018-06-01 MED ORDER — ACETAMINOPHEN 500 MG PO TABS
1000.0000 mg | ORAL_TABLET | Freq: Once | ORAL | Status: AC
  Administered 2018-06-01: 10:00:00 500 mg via ORAL
  Filled 2018-06-01: qty 2

## 2018-06-01 MED ORDER — SODIUM CHLORIDE 0.9 % IV SOLN
90.0000 mg | Freq: Once | INTRAVENOUS | Status: AC
  Administered 2018-06-01: 90 mg via INTRAVENOUS
  Filled 2018-06-01: qty 14

## 2018-06-01 NOTE — Patient Instructions (Signed)
Agalsidase Beta injection  What is this medicine?  AGALSIDASE BETA is used to replace an enzyme that is missing in patients with Fabry disease. It is not a cure.  This medicine may be used for other purposes; ask your health care provider or pharmacist if you have questions.  COMMON BRAND NAME(S): Fabrazyme  What should I tell my health care provider before I take this medicine?  They need to know if you have any of these conditions:  -heart disease  -an unusual or allergic reaction to agalsidase beta, mannitol, other medicines, foods, dyes, or preservatives  -pregnant or trying to get pregnant  -breast-feeding  How should I use this medicine?  This medicine is for infusion into a vein. It is given by a health care professional in a hospital or clinic setting.  Talk to your pediatrician regarding the use of this medicine in children. Special care may be needed.  Overdosage: If you think you have taken too much of this medicine contact a poison control center or emergency room at once.  NOTE: This medicine is only for you. Do not share this medicine with others.  What if I miss a dose?  It is important not to miss your dose. Call your doctor or health care professional if you are unable to keep an appointment.  What may interact with this medicine?  -amiodarone  -chloroquine  -gentamicin  -hydroxychloroquine  -monobenzone  This list may not describe all possible interactions. Give your health care provider a list of all the medicines, herbs, non-prescription drugs, or dietary supplements you use. Also tell them if you smoke, drink alcohol, or use illegal drugs. Some items may interact with your medicine.  What should I watch for while using this medicine?  Visit your doctor or health care professional for regular checks on your progress. Tell your doctor or healthcare professional if your symptoms do not start to get better or if they get worse.  There is a registry for patients with Fabry disease. The registry is  used to gather information about the disease and its effects. Talk to your health care provider if you would like to join the registry.  What side effects may I notice from receiving this medicine?  Side effects that you should report to your doctor or health care professional as soon as possible:  -allergic reactions like skin rash, itching or hives, swelling of the face, lips, or tongue  -breathing problems  -chest pain, tightness  -depression  -dizziness  -fast, irregular heart beat  -swelling of the arms or legs  Side effects that usually do not require medical attention (report to your doctor or health care professional if they continue or are bothersome):  -aches or pains  -anxiety  -fever or chills at the time of injection  -headache  -nausea, vomiting  -stomach pain, upset  This list may not describe all possible side effects. Call your doctor for medical advice about side effects. You may report side effects to FDA at 1-800-FDA-1088.  Where should I keep my medicine?  This drug is given in a hospital or clinic and will not be stored at home.  NOTE: This sheet is a summary. It may not cover all possible information. If you have questions about this medicine, talk to your doctor, pharmacist, or health care provider.   2019 Elsevier/Gold Standard (2005-10-26 12:25:00)

## 2018-06-07 DIAGNOSIS — R600 Localized edema: Secondary | ICD-10-CM | POA: Diagnosis not present

## 2018-06-07 DIAGNOSIS — E7521 Fabry (-Anderson) disease: Secondary | ICD-10-CM | POA: Diagnosis not present

## 2018-06-07 DIAGNOSIS — N183 Chronic kidney disease, stage 3 (moderate): Secondary | ICD-10-CM | POA: Diagnosis not present

## 2018-06-07 DIAGNOSIS — R809 Proteinuria, unspecified: Secondary | ICD-10-CM | POA: Diagnosis not present

## 2018-06-08 ENCOUNTER — Ambulatory Visit: Payer: Medicare Other

## 2018-06-15 ENCOUNTER — Inpatient Hospital Stay: Payer: Medicare Other

## 2018-06-15 ENCOUNTER — Ambulatory Visit: Payer: Medicare Other

## 2018-06-15 ENCOUNTER — Other Ambulatory Visit: Payer: Self-pay

## 2018-06-15 VITALS — BP 165/95 | HR 73 | Temp 97.0°F | Resp 18 | Wt 197.0 lb

## 2018-06-15 DIAGNOSIS — E7521 Fabry (-Anderson) disease: Secondary | ICD-10-CM

## 2018-06-15 MED ORDER — SODIUM CHLORIDE 0.9 % IV SOLN
Freq: Once | INTRAVENOUS | Status: AC
  Administered 2018-06-15: 10:00:00 via INTRAVENOUS
  Filled 2018-06-15: qty 250

## 2018-06-15 MED ORDER — ACETAMINOPHEN 500 MG PO TABS
1000.0000 mg | ORAL_TABLET | Freq: Once | ORAL | Status: AC
  Administered 2018-06-15: 10:00:00 1000 mg via ORAL
  Filled 2018-06-15: qty 2

## 2018-06-15 MED ORDER — SODIUM CHLORIDE 0.9 % IV SOLN
90.0000 mg | Freq: Once | INTRAVENOUS | Status: AC
  Administered 2018-06-15: 10:00:00 90 mg via INTRAVENOUS
  Filled 2018-06-15: qty 14

## 2018-06-22 ENCOUNTER — Ambulatory Visit: Payer: Medicare Other

## 2018-06-29 ENCOUNTER — Inpatient Hospital Stay: Payer: Medicare Other

## 2018-06-29 ENCOUNTER — Other Ambulatory Visit: Payer: Self-pay

## 2018-06-29 VITALS — BP 152/90 | HR 66 | Temp 97.7°F | Resp 18 | Wt 199.6 lb

## 2018-06-29 DIAGNOSIS — E7521 Fabry (-Anderson) disease: Secondary | ICD-10-CM | POA: Diagnosis not present

## 2018-06-29 MED ORDER — SODIUM CHLORIDE 0.9 % IV SOLN
Freq: Once | INTRAVENOUS | Status: AC
  Administered 2018-06-29: 10:00:00 via INTRAVENOUS
  Filled 2018-06-29: qty 250

## 2018-06-29 MED ORDER — SODIUM CHLORIDE 0.9 % IV SOLN
90.0000 mg | Freq: Once | INTRAVENOUS | Status: AC
  Administered 2018-06-29: 90 mg via INTRAVENOUS
  Filled 2018-06-29: qty 14

## 2018-06-29 MED ORDER — ACETAMINOPHEN 500 MG PO TABS
1000.0000 mg | ORAL_TABLET | Freq: Once | ORAL | Status: AC
  Administered 2018-06-29: 10:00:00 1000 mg via ORAL
  Filled 2018-06-29: qty 2

## 2018-06-29 NOTE — Patient Instructions (Signed)
Agalsidase Beta injection  What is this medicine?  AGALSIDASE BETA is used to replace an enzyme that is missing in patients with Fabry disease. It is not a cure.  This medicine may be used for other purposes; ask your health care provider or pharmacist if you have questions.  COMMON BRAND NAME(S): Fabrazyme  What should I tell my health care provider before I take this medicine?  They need to know if you have any of these conditions:  -heart disease  -an unusual or allergic reaction to agalsidase beta, mannitol, other medicines, foods, dyes, or preservatives  -pregnant or trying to get pregnant  -breast-feeding  How should I use this medicine?  This medicine is for infusion into a vein. It is given by a health care professional in a hospital or clinic setting.  Talk to your pediatrician regarding the use of this medicine in children. Special care may be needed.  Overdosage: If you think you have taken too much of this medicine contact a poison control center or emergency room at once.  NOTE: This medicine is only for you. Do not share this medicine with others.  What if I miss a dose?  It is important not to miss your dose. Call your doctor or health care professional if you are unable to keep an appointment.  What may interact with this medicine?  -amiodarone  -chloroquine  -gentamicin  -hydroxychloroquine  -monobenzone  This list may not describe all possible interactions. Give your health care provider a list of all the medicines, herbs, non-prescription drugs, or dietary supplements you use. Also tell them if you smoke, drink alcohol, or use illegal drugs. Some items may interact with your medicine.  What should I watch for while using this medicine?  Visit your doctor or health care professional for regular checks on your progress. Tell your doctor or healthcare professional if your symptoms do not start to get better or if they get worse.  There is a registry for patients with Fabry disease. The registry is  used to gather information about the disease and its effects. Talk to your health care provider if you would like to join the registry.  What side effects may I notice from receiving this medicine?  Side effects that you should report to your doctor or health care professional as soon as possible:  -allergic reactions like skin rash, itching or hives, swelling of the face, lips, or tongue  -breathing problems  -chest pain, tightness  -depression  -dizziness  -fast, irregular heart beat  -swelling of the arms or legs  Side effects that usually do not require medical attention (report to your doctor or health care professional if they continue or are bothersome):  -aches or pains  -anxiety  -fever or chills at the time of injection  -headache  -nausea, vomiting  -stomach pain, upset  This list may not describe all possible side effects. Call your doctor for medical advice about side effects. You may report side effects to FDA at 1-800-FDA-1088.  Where should I keep my medicine?  This drug is given in a hospital or clinic and will not be stored at home.  NOTE: This sheet is a summary. It may not cover all possible information. If you have questions about this medicine, talk to your doctor, pharmacist, or health care provider.   2019 Elsevier/Gold Standard (2005-10-26 12:25:00)

## 2018-07-01 ENCOUNTER — Ambulatory Visit: Payer: Medicare Other

## 2018-07-01 ENCOUNTER — Ambulatory Visit: Payer: Medicare Other | Admitting: Oncology

## 2018-07-06 ENCOUNTER — Ambulatory Visit: Payer: Medicare Other

## 2018-07-06 ENCOUNTER — Ambulatory Visit: Payer: Medicare Other | Admitting: Hematology and Oncology

## 2018-07-12 NOTE — Progress Notes (Signed)
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   HPI: The patient was diagnosed with Fabry disease over 15 years ago, approximately 2004. At that time, he experienced sudden chills, hot flashes, tingling in his feet, and ankle edema.  He states that he was diagnosed after a kidney biopsy. He denied skin changes or cardiac issues at that time. He began treatment with Fabrazyme on 09/22/2002.  He was initially seen by Rica Records, geneticist, at the [place] [facility].  He was initially seen in the hematology clinic in 07/05/2013 after moving to [place], [place] from [place], [place].  At that time, he was receiving Fabrazyme infusions every 2 weeks.   He is followed in genetics and metabolism by Dr. Karenann Cai at [facility].  He last saw her on 11/18/2017.  He reported stable symptoms: fatigue, pain and burning in hands and feet, constipation, and intermittent dizziness. At that time, his Fabrazyme dose was increased to 78m to maintain 183mkg.   [facility] have [facility].  He has stage 3 chronic kidney disease and is followed by Dr. LaHolley RaringHe takes lasix daily, alternating 4023mnd 70m7mis baseline creatinine is 1.7.  He has severe left ventricular hypertrophy and is followed by Dr. TimoIda Roguerdiology. He was last seen by Dr. GollRockey Situ11/01/2018 and was asymptomatic. No medication was started at that time, but low-dose beta-blockers or calcium channel blockers could be used in the future.   The patient was last seen in the hematology clinic on 01/07/2018 by Dr. FinnGrayland Ormond that time, he was doing well and at his baseline.   His most recent  Fabrazyme infusion was on 06/29/2018.   During the interim, he is doing well and at his baseline. He notes he occasionally gets severely fatigued before his infusions.  He occasionally has diarrhea, for which he takes Lomotil.  He has swelling in his left leg, for which he is on Lasix.  He occasionally checks in over the phone with Dr. [place] mother passed away a few years ago after several strokes and heart attacks. He does not know of a family history of Fabry disease; his two brothers refuse to get tested.    Past Medical History:  Diagnosis Date  . CKD (chronic kidney disease), stage II    a. 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 Fabry disease (HCC)[place]. [place], previously treated with Fabrazyme  . Fabry disease (HCC)[place]. Hypertension   . LVH (left ventricular hypertrophy)   . Mixed hyperlipidemia   . Vitamin D deficiency     Past Surgical History:  Procedure Laterality Date  . KNEE SURGERY Left    20+ years ago, 6+ years ago  . NO PAST SURGERIES      Family History  Problem Relation Age of Onset  . Stroke Mother   . CAD Mother   . Heart attack Mother   . CAD Father   . Skin cancer Father     Social History:  reports that he has never smoked. He has never used smokeless  tobacco. He reports that he does not drink alcohol or use drugs. He moved to [place], [place] after he got married in 2015. [employer] has a support group for people with Fabry disease and provides financial assistance. He does not smoke or drink alcohol. He currently does not work due to his health issues. He previously worked in Mining engineer.  He lives in [place].  The patient is alone today.  Allergies:  Allergies  Allergen Reactions  . Other Other (See Comments)    Nephrologist recommended avoiding contrast.  . Penicillins Other (See Comments)    unknown    Current Medications: Current Outpatient Medications  Medication Sig Dispense Refill   . aspirin 81 MG tablet Take 81 mg by mouth daily.    . cyanocobalamin 1000 MCG tablet Take 100 mcg by mouth daily.    . furosemide (LASIX) 40 MG tablet Take 40 mg by mouth daily.    Marland Kitchen loratadine (CLARITIN) 10 MG tablet Take 10 mg by mouth.    . losartan (COZAAR) 100 MG tablet Take 50 mg by mouth daily.    . Multiple Vitamin (MULTIVITAMIN) tablet Take 1 tablet by mouth daily.    . potassium chloride SA (K-DUR,KLOR-CON) 20 MEQ tablet Take 20 mEq by mouth 2 (two) times daily.    . diphenoxylate-atropine (LOMOTIL) 2.5-0.025 MG per tablet Take 2 tablets by mouth 4 (four) times daily as needed.      No current facility-administered medications for this visit.    Facility-Administered Medications Ordered in Other Visits  Medication Dose Route Frequency Provider Last Rate Last Dose  . acetaminophen (TYLENOL) tablet 1,000 mg  1,000 mg Oral Once Lloyd Huger, MD        Review of Systems  Constitutional: Positive for malaise/fatigue (chronic, baseline). Negative for chills, diaphoresis, fever and weight loss.  HENT: Negative.  Negative for hearing loss, nosebleeds, sinus pain and sore throat.   Eyes: Negative.  Negative for blurred vision and double vision.  Respiratory: Negative for cough, shortness of breath and wheezing.   Cardiovascular: Positive for leg swelling (left). Negative for chest pain, palpitations, orthopnea and PND.  Gastrointestinal: Positive for diarrhea (occasional, takes Lomotil). Negative for abdominal pain, blood in stool, constipation, heartburn, melena, nausea and vomiting.  Genitourinary: Negative.  Negative for dysuria, frequency, hematuria and urgency.  Musculoskeletal: Negative.  Negative for joint pain and myalgias.  Skin: Negative.  Negative for itching and rash.  Neurological: Negative.  Negative for dizziness, tingling, sensory change, weakness and headaches.  Endo/Heme/Allergies: Negative.  Does not bruise/bleed easily.  Psychiatric/Behavioral: Negative.   Negative for memory loss. The patient is not nervous/anxious and does not have insomnia.   All other systems reviewed and are negative.  Performance status (ECOG):  1  Physical Exam  Constitutional: He is oriented to person, place, and time. He appears well-developed and well-nourished. No distress.  HENT:  Head: Normocephalic and atraumatic.  Mouth/Throat: Oropharynx is clear and moist.  Graying hair. Mustache. Wearing mask.  Eyes: Pupils are equal, round, and reactive to light. Conjunctivae and EOM are normal.  Neck: Normal range of motion. Neck supple.  Cardiovascular: Normal rate, regular rhythm and normal heart sounds. Exam reveals no gallop and no friction rub.  No murmur heard. Pulmonary/Chest: Effort normal and breath sounds normal. No respiratory distress. He has no wheezes.  Abdominal: Soft. Bowel sounds are normal. He exhibits no distension. There is no abdominal tenderness. There is no rebound.  Musculoskeletal: Normal range of motion.  General: Edema (chronic LLE) present.  Lymphadenopathy:    He has no cervical adenopathy.    He has no axillary adenopathy.       Right: No supraclavicular adenopathy present.       Left: No supraclavicular adenopathy present.  Neurological: He is alert and oriented to person, place, and time.  Skin: Skin is warm and dry. No rash noted. He is not diaphoretic. No erythema. No pallor.  No angiokeratomas.  Psychiatric: He has a normal mood and affect. His behavior is normal. Judgment and thought content normal.  Nursing note and vitals reviewed.   No visits with results within 3 Day(s) from this visit.  Latest known visit with results is:  Admission on 05/21/2015, Discharged on 05/21/2015  Component Date Value Ref Range Status  . Lipase 05/21/2015 30  11 - 51 U/L Final  . Sodium 05/21/2015 133* 135 - 145 mmol/L Final  . Potassium 05/21/2015 3.1* 3.5 - 5.1 mmol/L Final  . Chloride 05/21/2015 109  101 - 111 mmol/L Final  . CO2  05/21/2015 17* 22 - 32 mmol/L Final  . Glucose, Bld 05/21/2015 96  65 - 99 mg/dL Final  . BUN 05/21/2015 16  6 - 20 mg/dL Final  . Creatinine, Ser 05/21/2015 1.62* 0.61 - 1.24 mg/dL Final  . Calcium 05/21/2015 9.0  8.9 - 10.3 mg/dL Final  . Total Protein 05/21/2015 7.7  6.5 - 8.1 g/dL Final  . Albumin 05/21/2015 3.7  3.5 - 5.0 g/dL Final  . AST 05/21/2015 55* 15 - 41 U/L Final  . ALT 05/21/2015 86* 17 - 63 U/L Final  . Alkaline Phosphatase 05/21/2015 62  38 - 126 U/L Final  . Total Bilirubin 05/21/2015 0.8  0.3 - 1.2 mg/dL Final  . GFR calc non Af Amer 05/21/2015 48* >60 mL/min Final  . GFR calc Af Amer 05/21/2015 55* >60 mL/min Final   Comment: (NOTE) The eGFR has been calculated using the CKD EPI equation. This calculation has not been validated in all clinical situations. eGFR's persistently <60 mL/min signify possible Chronic Kidney Disease.   . Anion gap 05/21/2015 7  5 - 15 Final  . WBC 05/21/2015 7.0  3.8 - 10.6 K/uL Final  . RBC 05/21/2015 5.05  4.40 - 5.90 MIL/uL Final  . Hemoglobin 05/21/2015 14.7  13.0 - 18.0 g/dL Final  . HCT 05/21/2015 43.4  40.0 - 52.0 % Final  . MCV 05/21/2015 85.9  80.0 - 100.0 fL Final  . MCH 05/21/2015 29.0  26.0 - 34.0 pg Final  . MCHC 05/21/2015 33.8  32.0 - 36.0 g/dL Final  . RDW 05/21/2015 13.7  11.5 - 14.5 % Final  . Platelets 05/21/2015 370  150 - 440 K/uL Final    Assessment:  JAXTON CASALE is a 53 y.o. 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  He was initially diagnosed > 15 years ago.  Diagnosis was made after a kidney biopsy.  He began Fabrazyme on 09/22/2002.   He receives enzyme replacement therapy  with recombinant alpha-galactosidase A (alpha-Gal A) with Fabrazyme, agalsidase beta, every 2 weeks.   He last received Fabrazyme on 06/29/2018.   He has stage III chronic kidney disease.  Baseline creatinine is 1.7.  He has severe left ventricular hypertrophy.   Symptomatically, he has fatigue prior to his infusions.  Exam reveals chronic left lower  extremity edema.  Plan: 1.   Anderson-Fabry disease  Discuss entire medical history, diagnosis and management of Fabry's disease.  Patient receives enzyme replacement therapy every  2 weeks. 2.   RTC every 2 weeks for Fabrazyme. 3.   RTC in 6 months for MD assessment and Fabrazyme.  I discussed the assessment and treatment plan with the patient.  The patient was provided an opportunity to ask questions and all were answered.  The patient agreed with the plan and demonstrated an understanding of the instructions.  The patient was advised to call back if the symptoms worsen or if the condition fails to improve as anticipated.   Lequita Asal, MD, PhD    07/13/2018, 10:01 AM  I, Molly Dorshimer, am acting as Education administrator for [employer]. Mike Gip, MD, PhD.  I, Melissa C. Mike Gip, MD, have reviewed the above documentation for accuracy and completeness, and I agree with the above.

## 2018-07-13 ENCOUNTER — Inpatient Hospital Stay: Payer: Medicare Other

## 2018-07-13 ENCOUNTER — Other Ambulatory Visit: Payer: Self-pay

## 2018-07-13 ENCOUNTER — Encounter: Payer: Self-pay | Admitting: Hematology and Oncology

## 2018-07-13 ENCOUNTER — Inpatient Hospital Stay: Payer: Medicare Other | Attending: Hematology and Oncology | Admitting: Hematology and Oncology

## 2018-07-13 VITALS — BP 135/88 | HR 74 | Resp 18

## 2018-07-13 VITALS — BP 139/76 | HR 75 | Temp 97.1°F | Resp 18 | Ht 69.0 in | Wt 201.7 lb

## 2018-07-13 DIAGNOSIS — E7521 Fabry (-Anderson) disease: Secondary | ICD-10-CM

## 2018-07-13 DIAGNOSIS — I517 Cardiomegaly: Secondary | ICD-10-CM | POA: Diagnosis not present

## 2018-07-13 DIAGNOSIS — M7989 Other specified soft tissue disorders: Secondary | ICD-10-CM | POA: Diagnosis not present

## 2018-07-13 DIAGNOSIS — R5383 Other fatigue: Secondary | ICD-10-CM | POA: Diagnosis not present

## 2018-07-13 DIAGNOSIS — Z8249 Family history of ischemic heart disease and other diseases of the circulatory system: Secondary | ICD-10-CM | POA: Insufficient documentation

## 2018-07-13 DIAGNOSIS — Z79899 Other long term (current) drug therapy: Secondary | ICD-10-CM | POA: Insufficient documentation

## 2018-07-13 DIAGNOSIS — R197 Diarrhea, unspecified: Secondary | ICD-10-CM | POA: Diagnosis not present

## 2018-07-13 DIAGNOSIS — N183 Chronic kidney disease, stage 3 (moderate): Secondary | ICD-10-CM

## 2018-07-13 DIAGNOSIS — K5900 Constipation, unspecified: Secondary | ICD-10-CM | POA: Diagnosis not present

## 2018-07-13 DIAGNOSIS — R600 Localized edema: Secondary | ICD-10-CM | POA: Diagnosis not present

## 2018-07-13 DIAGNOSIS — R42 Dizziness and giddiness: Secondary | ICD-10-CM | POA: Insufficient documentation

## 2018-07-13 DIAGNOSIS — M79643 Pain in unspecified hand: Secondary | ICD-10-CM | POA: Diagnosis not present

## 2018-07-13 MED ORDER — SODIUM CHLORIDE 0.9 % IV SOLN
Freq: Once | INTRAVENOUS | Status: AC
  Administered 2018-07-13: 10:00:00 via INTRAVENOUS
  Filled 2018-07-13: qty 250

## 2018-07-13 MED ORDER — ACETAMINOPHEN 500 MG PO TABS
1000.0000 mg | ORAL_TABLET | Freq: Once | ORAL | Status: AC
  Administered 2018-07-13: 1000 mg via ORAL
  Filled 2018-07-13: qty 2

## 2018-07-13 MED ORDER — SODIUM CHLORIDE 0.9 % IV SOLN
90.0000 mg | Freq: Once | INTRAVENOUS | Status: AC
  Administered 2018-07-13: 90 mg via INTRAVENOUS
  Filled 2018-07-13: qty 14

## 2018-07-13 NOTE — Progress Notes (Signed)
No new changes noted today 

## 2018-07-13 NOTE — Patient Instructions (Signed)
Agalsidase Beta injection  What is this medicine?  AGALSIDASE BETA is used to replace an enzyme that is missing in patients with Fabry disease. It is not a cure.  This medicine may be used for other purposes; ask your health care provider or pharmacist if you have questions.  COMMON BRAND NAME(S): Fabrazyme  What should I tell my health care provider before I take this medicine?  They need to know if you have any of these conditions:  -heart disease  -an unusual or allergic reaction to agalsidase beta, mannitol, other medicines, foods, dyes, or preservatives  -pregnant or trying to get pregnant  -breast-feeding  How should I use this medicine?  This medicine is for infusion into a vein. It is given by a health care professional in a hospital or clinic setting.  Talk to your pediatrician regarding the use of this medicine in children. Special care may be needed.  Overdosage: If you think you have taken too much of this medicine contact a poison control center or emergency room at once.  NOTE: This medicine is only for you. Do not share this medicine with others.  What if I miss a dose?  It is important not to miss your dose. Call your doctor or health care professional if you are unable to keep an appointment.  What may interact with this medicine?  -amiodarone  -chloroquine  -gentamicin  -hydroxychloroquine  -monobenzone  This list may not describe all possible interactions. Give your health care provider a list of all the medicines, herbs, non-prescription drugs, or dietary supplements you use. Also tell them if you smoke, drink alcohol, or use illegal drugs. Some items may interact with your medicine.  What should I watch for while using this medicine?  Visit your doctor or health care professional for regular checks on your progress. Tell your doctor or healthcare professional if your symptoms do not start to get better or if they get worse.  There is a registry for patients with Fabry disease. The registry is  used to gather information about the disease and its effects. Talk to your health care provider if you would like to join the registry.  What side effects may I notice from receiving this medicine?  Side effects that you should report to your doctor or health care professional as soon as possible:  -allergic reactions like skin rash, itching or hives, swelling of the face, lips, or tongue  -breathing problems  -chest pain, tightness  -depression  -dizziness  -fast, irregular heart beat  -swelling of the arms or legs  Side effects that usually do not require medical attention (report to your doctor or health care professional if they continue or are bothersome):  -aches or pains  -anxiety  -fever or chills at the time of injection  -headache  -nausea, vomiting  -stomach pain, upset  This list may not describe all possible side effects. Call your doctor for medical advice about side effects. You may report side effects to FDA at 1-800-FDA-1088.  Where should I keep my medicine?  This drug is given in a hospital or clinic and will not be stored at home.  NOTE: This sheet is a summary. It may not cover all possible information. If you have questions about this medicine, talk to your doctor, pharmacist, or health care provider.   2019 Elsevier/Gold Standard (2005-10-26 12:25:00)

## 2018-07-27 ENCOUNTER — Inpatient Hospital Stay: Payer: Medicare Other

## 2018-07-27 ENCOUNTER — Other Ambulatory Visit: Payer: Self-pay

## 2018-07-27 VITALS — BP 140/86 | HR 67 | Temp 95.4°F | Resp 20 | Wt 200.5 lb

## 2018-07-27 DIAGNOSIS — M7989 Other specified soft tissue disorders: Secondary | ICD-10-CM | POA: Diagnosis not present

## 2018-07-27 DIAGNOSIS — K5900 Constipation, unspecified: Secondary | ICD-10-CM | POA: Diagnosis not present

## 2018-07-27 DIAGNOSIS — E7521 Fabry (-Anderson) disease: Secondary | ICD-10-CM | POA: Diagnosis not present

## 2018-07-27 DIAGNOSIS — M79643 Pain in unspecified hand: Secondary | ICD-10-CM | POA: Diagnosis not present

## 2018-07-27 DIAGNOSIS — N183 Chronic kidney disease, stage 3 (moderate): Secondary | ICD-10-CM | POA: Diagnosis not present

## 2018-07-27 DIAGNOSIS — I517 Cardiomegaly: Secondary | ICD-10-CM | POA: Diagnosis not present

## 2018-07-27 MED ORDER — SODIUM CHLORIDE 0.9 % IV SOLN
90.0000 mg | Freq: Once | INTRAVENOUS | Status: AC
  Administered 2018-07-27: 90 mg via INTRAVENOUS
  Filled 2018-07-27: qty 14

## 2018-07-27 MED ORDER — ACETAMINOPHEN 500 MG PO TABS
1000.0000 mg | ORAL_TABLET | Freq: Once | ORAL | Status: AC
  Administered 2018-07-27: 1000 mg via ORAL

## 2018-07-27 MED ORDER — SODIUM CHLORIDE 0.9 % IV SOLN
Freq: Once | INTRAVENOUS | Status: AC
  Administered 2018-07-27: 10:00:00 via INTRAVENOUS
  Filled 2018-07-27: qty 250

## 2018-07-27 NOTE — Patient Instructions (Signed)
Agalsidase Beta injection  What is this medicine?  AGALSIDASE BETA is used to replace an enzyme that is missing in patients with Fabry disease. It is not a cure.  This medicine may be used for other purposes; ask your health care provider or pharmacist if you have questions.  COMMON BRAND NAME(S): Fabrazyme  What should I tell my health care provider before I take this medicine?  They need to know if you have any of these conditions:  -heart disease  -an unusual or allergic reaction to agalsidase beta, mannitol, other medicines, foods, dyes, or preservatives  -pregnant or trying to get pregnant  -breast-feeding  How should I use this medicine?  This medicine is for infusion into a vein. It is given by a health care professional in a hospital or clinic setting.  Talk to your pediatrician regarding the use of this medicine in children. Special care may be needed.  Overdosage: If you think you have taken too much of this medicine contact a poison control center or emergency room at once.  NOTE: This medicine is only for you. Do not share this medicine with others.  What if I miss a dose?  It is important not to miss your dose. Call your doctor or health care professional if you are unable to keep an appointment.  What may interact with this medicine?  -amiodarone  -chloroquine  -gentamicin  -hydroxychloroquine  -monobenzone  This list may not describe all possible interactions. Give your health care provider a list of all the medicines, herbs, non-prescription drugs, or dietary supplements you use. Also tell them if you smoke, drink alcohol, or use illegal drugs. Some items may interact with your medicine.  What should I watch for while using this medicine?  Visit your doctor or health care professional for regular checks on your progress. Tell your doctor or healthcare professional if your symptoms do not start to get better or if they get worse.  There is a registry for patients with Fabry disease. The registry is  used to gather information about the disease and its effects. Talk to your health care provider if you would like to join the registry.  What side effects may I notice from receiving this medicine?  Side effects that you should report to your doctor or health care professional as soon as possible:  -allergic reactions like skin rash, itching or hives, swelling of the face, lips, or tongue  -breathing problems  -chest pain, tightness  -depression  -dizziness  -fast, irregular heart beat  -swelling of the arms or legs  Side effects that usually do not require medical attention (report to your doctor or health care professional if they continue or are bothersome):  -aches or pains  -anxiety  -fever or chills at the time of injection  -headache  -nausea, vomiting  -stomach pain, upset  This list may not describe all possible side effects. Call your doctor for medical advice about side effects. You may report side effects to FDA at 1-800-FDA-1088.  Where should I keep my medicine?  This drug is given in a hospital or clinic and will not be stored at home.  NOTE: This sheet is a summary. It may not cover all possible information. If you have questions about this medicine, talk to your doctor, pharmacist, or health care provider.   2019 Elsevier/Gold Standard (2005-10-26 12:25:00)

## 2018-08-10 ENCOUNTER — Other Ambulatory Visit: Payer: Self-pay

## 2018-08-10 ENCOUNTER — Inpatient Hospital Stay: Payer: Medicare Other | Attending: Hematology and Oncology

## 2018-08-10 VITALS — BP 153/74 | HR 68 | Resp 20 | Wt 200.3 lb

## 2018-08-10 DIAGNOSIS — E7521 Fabry (-Anderson) disease: Secondary | ICD-10-CM | POA: Insufficient documentation

## 2018-08-10 DIAGNOSIS — N183 Chronic kidney disease, stage 3 (moderate): Secondary | ICD-10-CM | POA: Insufficient documentation

## 2018-08-10 MED ORDER — SODIUM CHLORIDE 0.9 % IV SOLN: 90.0000 mg | Freq: Once | INTRAVENOUS | Status: DC

## 2018-08-10 MED ORDER — SODIUM CHLORIDE 0.9 % IV SOLN
Freq: Once | INTRAVENOUS | Status: AC
  Administered 2018-08-10: 10:00:00 via INTRAVENOUS
  Filled 2018-08-10: qty 250

## 2018-08-10 MED ORDER — SODIUM CHLORIDE 0.9 % IV SOLN
90.0000 mg | Freq: Once | INTRAVENOUS | Status: AC
  Administered 2018-08-10: 90 mg via INTRAVENOUS
  Filled 2018-08-10: qty 14

## 2018-08-10 MED ORDER — ACETAMINOPHEN 500 MG PO TABS
1000.0000 mg | ORAL_TABLET | Freq: Once | ORAL | Status: AC
  Administered 2018-08-10: 1000 mg via ORAL
  Filled 2018-08-10: qty 2

## 2018-08-10 NOTE — Patient Instructions (Signed)
Agalsidase Beta injection  What is this medicine?  AGALSIDASE BETA is used to replace an enzyme that is missing in patients with Fabry disease. It is not a cure.  This medicine may be used for other purposes; ask your health care provider or pharmacist if you have questions.  COMMON BRAND NAME(S): Fabrazyme  What should I tell my health care provider before I take this medicine?  They need to know if you have any of these conditions:  -heart disease  -an unusual or allergic reaction to agalsidase beta, mannitol, other medicines, foods, dyes, or preservatives  -pregnant or trying to get pregnant  -breast-feeding  How should I use this medicine?  This medicine is for infusion into a vein. It is given by a health care professional in a hospital or clinic setting.  Talk to your pediatrician regarding the use of this medicine in children. Special care may be needed.  Overdosage: If you think you have taken too much of this medicine contact a poison control center or emergency room at once.  NOTE: This medicine is only for you. Do not share this medicine with others.  What if I miss a dose?  It is important not to miss your dose. Call your doctor or health care professional if you are unable to keep an appointment.  What may interact with this medicine?  -amiodarone  -chloroquine  -gentamicin  -hydroxychloroquine  -monobenzone  This list may not describe all possible interactions. Give your health care provider a list of all the medicines, herbs, non-prescription drugs, or dietary supplements you use. Also tell them if you smoke, drink alcohol, or use illegal drugs. Some items may interact with your medicine.  What should I watch for while using this medicine?  Visit your doctor or health care professional for regular checks on your progress. Tell your doctor or healthcare professional if your symptoms do not start to get better or if they get worse.  There is a registry for patients with Fabry disease. The registry is  used to gather information about the disease and its effects. Talk to your health care provider if you would like to join the registry.  What side effects may I notice from receiving this medicine?  Side effects that you should report to your doctor or health care professional as soon as possible:  -allergic reactions like skin rash, itching or hives, swelling of the face, lips, or tongue  -breathing problems  -chest pain, tightness  -depression  -dizziness  -fast, irregular heart beat  -swelling of the arms or legs  Side effects that usually do not require medical attention (report to your doctor or health care professional if they continue or are bothersome):  -aches or pains  -anxiety  -fever or chills at the time of injection  -headache  -nausea, vomiting  -stomach pain, upset  This list may not describe all possible side effects. Call your doctor for medical advice about side effects. You may report side effects to FDA at 1-800-FDA-1088.  Where should I keep my medicine?  This drug is given in a hospital or clinic and will not be stored at home.  NOTE: This sheet is a summary. It may not cover all possible information. If you have questions about this medicine, talk to your doctor, pharmacist, or health care provider.   2019 Elsevier/Gold Standard (2005-10-26 12:25:00)

## 2018-08-23 ENCOUNTER — Other Ambulatory Visit: Payer: Self-pay

## 2018-08-24 ENCOUNTER — Inpatient Hospital Stay: Payer: Medicare Other | Attending: Hematology and Oncology

## 2018-08-24 VITALS — BP 142/91 | HR 60 | Resp 20

## 2018-08-24 DIAGNOSIS — E7521 Fabry (-Anderson) disease: Secondary | ICD-10-CM | POA: Insufficient documentation

## 2018-08-24 MED ORDER — ACETAMINOPHEN 500 MG PO TABS
1000.0000 mg | ORAL_TABLET | Freq: Once | ORAL | Status: AC
  Administered 2018-08-24: 10:00:00 1000 mg via ORAL
  Filled 2018-08-24: qty 2

## 2018-08-24 MED ORDER — SODIUM CHLORIDE 0.9 % IV SOLN
Freq: Once | INTRAVENOUS | Status: AC
  Administered 2018-08-24: 10:00:00 via INTRAVENOUS
  Filled 2018-08-24: qty 250

## 2018-08-24 MED ORDER — SODIUM CHLORIDE 0.9 % IV SOLN
90.0000 mg | Freq: Once | INTRAVENOUS | Status: AC
  Administered 2018-08-24: 10:00:00 90 mg via INTRAVENOUS
  Filled 2018-08-24: qty 14

## 2018-09-07 ENCOUNTER — Inpatient Hospital Stay: Payer: Medicare Other | Attending: Hematology and Oncology

## 2018-09-07 ENCOUNTER — Other Ambulatory Visit: Payer: Self-pay

## 2018-09-07 VITALS — BP 141/81 | HR 57 | Resp 20 | Wt 196.9 lb

## 2018-09-07 DIAGNOSIS — E7521 Fabry (-Anderson) disease: Secondary | ICD-10-CM | POA: Diagnosis not present

## 2018-09-07 MED ORDER — ACETAMINOPHEN 500 MG PO TABS: 1000.0000 mg | ORAL_TABLET | Freq: Once | ORAL | Status: DC

## 2018-09-07 MED ORDER — SODIUM CHLORIDE 0.9 % IV SOLN
Freq: Once | INTRAVENOUS | Status: AC
  Administered 2018-09-07: 10:00:00 via INTRAVENOUS
  Filled 2018-09-07: qty 250

## 2018-09-07 MED ORDER — SODIUM CHLORIDE 0.9 % IV SOLN
90.0000 mg | Freq: Once | INTRAVENOUS | Status: AC
  Administered 2018-09-07: 90 mg via INTRAVENOUS
  Filled 2018-09-07: qty 14

## 2018-09-07 NOTE — Patient Instructions (Signed)
Agalsidase Beta injection What is this medicine? AGALSIDASE BETA is used to replace an enzyme that is missing in patients with Fabry disease. It is not a cure. This medicine may be used for other purposes; ask your health care provider or pharmacist if you have questions. COMMON BRAND NAME(S): Fabrazyme What should I tell my health care provider before I take this medicine? They need to know if you have any of these conditions:  heart disease  an unusual or allergic reaction to agalsidase beta, mannitol, other medicines, foods, dyes, or preservatives  pregnant or trying to get pregnant  breast-feeding How should I use this medicine? This medicine is for infusion into a vein. It is given by a health care professional in a hospital or clinic setting. Talk to your pediatrician regarding the use of this medicine in children. Special care may be needed. Overdosage: If you think you have taken too much of this medicine contact a poison control center or emergency room at once. NOTE: This medicine is only for you. Do not share this medicine with others. What if I miss a dose? It is important not to miss your dose. Call your doctor or health care professional if you are unable to keep an appointment. What may interact with this medicine?  amiodarone  chloroquine  gentamicin  hydroxychloroquine  monobenzone This list may not describe all possible interactions. Give your health care provider a list of all the medicines, herbs, non-prescription drugs, or dietary supplements you use. Also tell them if you smoke, drink alcohol, or use illegal drugs. Some items may interact with your medicine. What should I watch for while using this medicine? Visit your doctor or health care professional for regular checks on your progress. Tell your doctor or healthcare professional if your symptoms do not start to get better or if they get worse. There is a registry for patients with Fabry disease. The  registry is used to gather information about the disease and its effects. Talk to your health care provider if you would like to join the registry. What side effects may I notice from receiving this medicine? Side effects that you should report to your doctor or health care professional as soon as possible:  allergic reactions like skin rash, itching or hives, swelling of the face, lips, or tongue  breathing problems  chest pain, tightness  depression  dizziness  fast, irregular heart beat  swelling of the arms or legs Side effects that usually do not require medical attention (report to your doctor or health care professional if they continue or are bothersome):  aches or pains  anxiety  fever or chills at the time of injection  headache  nausea, vomiting  stomach pain, upset This list may not describe all possible side effects. Call your doctor for medical advice about side effects. You may report side effects to FDA at 1-800-FDA-1088. Where should I keep my medicine? This drug is given in a hospital or clinic and will not be stored at home. NOTE: This sheet is a summary. It may not cover all possible information. If you have questions about this medicine, talk to your doctor, pharmacist, or health care provider.  2020 Elsevier/Gold Standard (2005-10-26 12:25:00)

## 2018-09-21 ENCOUNTER — Inpatient Hospital Stay: Payer: Medicare Other

## 2018-09-21 ENCOUNTER — Other Ambulatory Visit: Payer: Self-pay

## 2018-09-21 VITALS — BP 132/81 | HR 53 | Temp 96.9°F | Resp 18

## 2018-09-21 DIAGNOSIS — E7521 Fabry (-Anderson) disease: Secondary | ICD-10-CM

## 2018-09-21 MED ORDER — ACETAMINOPHEN 500 MG PO TABS: 1000.0000 mg | ORAL_TABLET | Freq: Once | ORAL | Status: DC

## 2018-09-21 MED ORDER — SODIUM CHLORIDE 0.9 % IV SOLN
90.0000 mg | Freq: Once | INTRAVENOUS | Status: AC
  Administered 2018-09-21: 10:00:00 90 mg via INTRAVENOUS
  Filled 2018-09-21: qty 14

## 2018-09-21 MED ORDER — SODIUM CHLORIDE 0.9 % IV SOLN
Freq: Once | INTRAVENOUS | Status: AC
  Administered 2018-09-21: 10:00:00 via INTRAVENOUS
  Filled 2018-09-21: qty 250

## 2018-10-04 ENCOUNTER — Other Ambulatory Visit: Payer: Self-pay

## 2018-10-05 ENCOUNTER — Inpatient Hospital Stay: Payer: Medicare Other | Attending: Hematology and Oncology

## 2018-10-05 VITALS — BP 135/78 | HR 60 | Temp 98.1°F | Resp 18 | Wt 196.8 lb

## 2018-10-05 DIAGNOSIS — E7521 Fabry (-Anderson) disease: Secondary | ICD-10-CM

## 2018-10-05 MED ORDER — SODIUM CHLORIDE 0.9 % IV SOLN
90.0000 mg | Freq: Once | INTRAVENOUS | Status: AC
  Administered 2018-10-05: 90 mg via INTRAVENOUS
  Filled 2018-10-05: qty 14

## 2018-10-05 MED ORDER — SODIUM CHLORIDE 0.9 % IV SOLN
Freq: Once | INTRAVENOUS | Status: AC
  Administered 2018-10-05: 10:00:00 via INTRAVENOUS
  Filled 2018-10-05: qty 250

## 2018-10-05 MED ORDER — ACETAMINOPHEN 500 MG PO TABS: 1000.0000 mg | ORAL_TABLET | Freq: Once | ORAL | Status: DC

## 2018-10-05 NOTE — Patient Instructions (Signed)
Agalsidase Beta injection What is this medicine? AGALSIDASE BETA is used to replace an enzyme that is missing in patients with Fabry disease. It is not a cure. This medicine may be used for other purposes; ask your health care provider or pharmacist if you have questions. COMMON BRAND NAME(S): Fabrazyme What should I tell my health care provider before I take this medicine? They need to know if you have any of these conditions:  heart disease  an unusual or allergic reaction to agalsidase beta, mannitol, other medicines, foods, dyes, or preservatives  pregnant or trying to get pregnant  breast-feeding How should I use this medicine? This medicine is for infusion into a vein. It is given by a health care professional in a hospital or clinic setting. Talk to your pediatrician regarding the use of this medicine in children. Special care may be needed. Overdosage: If you think you have taken too much of this medicine contact a poison control center or emergency room at once. NOTE: This medicine is only for you. Do not share this medicine with others. What if I miss a dose? It is important not to miss your dose. Call your doctor or health care professional if you are unable to keep an appointment. What may interact with this medicine?  amiodarone  chloroquine  gentamicin  hydroxychloroquine  monobenzone This list may not describe all possible interactions. Give your health care provider a list of all the medicines, herbs, non-prescription drugs, or dietary supplements you use. Also tell them if you smoke, drink alcohol, or use illegal drugs. Some items may interact with your medicine. What should I watch for while using this medicine? Visit your doctor or health care professional for regular checks on your progress. Tell your doctor or healthcare professional if your symptoms do not start to get better or if they get worse. There is a registry for patients with Fabry disease. The  registry is used to gather information about the disease and its effects. Talk to your health care provider if you would like to join the registry. What side effects may I notice from receiving this medicine? Side effects that you should report to your doctor or health care professional as soon as possible:  allergic reactions like skin rash, itching or hives, swelling of the face, lips, or tongue  breathing problems  chest pain, tightness  depression  dizziness  fast, irregular heart beat  swelling of the arms or legs Side effects that usually do not require medical attention (report to your doctor or health care professional if they continue or are bothersome):  aches or pains  anxiety  fever or chills at the time of injection  headache  nausea, vomiting  stomach pain, upset This list may not describe all possible side effects. Call your doctor for medical advice about side effects. You may report side effects to FDA at 1-800-FDA-1088. Where should I keep my medicine? This drug is given in a hospital or clinic and will not be stored at home. NOTE: This sheet is a summary. It may not cover all possible information. If you have questions about this medicine, talk to your doctor, pharmacist, or health care provider.  2020 Elsevier/Gold Standard (2005-10-26 12:25:00)

## 2018-10-06 DIAGNOSIS — N183 Chronic kidney disease, stage 3 (moderate): Secondary | ICD-10-CM | POA: Diagnosis not present

## 2018-10-06 DIAGNOSIS — R809 Proteinuria, unspecified: Secondary | ICD-10-CM | POA: Diagnosis not present

## 2018-10-06 DIAGNOSIS — E7521 Fabry (-Anderson) disease: Secondary | ICD-10-CM | POA: Diagnosis not present

## 2018-10-06 DIAGNOSIS — R009 Unspecified abnormalities of heart beat: Secondary | ICD-10-CM | POA: Diagnosis not present

## 2018-10-19 ENCOUNTER — Inpatient Hospital Stay: Payer: Medicare Other

## 2018-10-19 ENCOUNTER — Other Ambulatory Visit: Payer: Self-pay

## 2018-10-19 VITALS — BP 150/84 | HR 61 | Temp 96.9°F | Resp 18 | Wt 199.5 lb

## 2018-10-19 DIAGNOSIS — E7521 Fabry (-Anderson) disease: Secondary | ICD-10-CM

## 2018-10-19 MED ORDER — SODIUM CHLORIDE 0.9 % IV SOLN
90.0000 mg | Freq: Once | INTRAVENOUS | Status: AC
  Administered 2018-10-19: 90 mg via INTRAVENOUS
  Filled 2018-10-19: qty 4

## 2018-10-19 MED ORDER — SODIUM CHLORIDE 0.9 % IV SOLN
Freq: Once | INTRAVENOUS | Status: AC
  Administered 2018-10-19: 10:00:00 via INTRAVENOUS
  Filled 2018-10-19: qty 250

## 2018-10-19 MED ORDER — ACETAMINOPHEN 500 MG PO TABS: 1000.0000 mg | ORAL_TABLET | Freq: Once | ORAL | Status: DC

## 2018-10-26 ENCOUNTER — Inpatient Hospital Stay: Payer: Medicare Other

## 2018-11-01 ENCOUNTER — Other Ambulatory Visit: Payer: Self-pay

## 2018-11-02 ENCOUNTER — Inpatient Hospital Stay: Payer: Medicare Other | Attending: Hematology and Oncology

## 2018-11-02 VITALS — BP 136/87 | HR 69 | Temp 97.3°F | Resp 18 | Wt 197.1 lb

## 2018-11-02 DIAGNOSIS — E7521 Fabry (-Anderson) disease: Secondary | ICD-10-CM | POA: Diagnosis not present

## 2018-11-02 MED ORDER — ACETAMINOPHEN 500 MG PO TABS: 1000.0000 mg | ORAL_TABLET | Freq: Once | ORAL | Status: DC

## 2018-11-02 MED ORDER — SODIUM CHLORIDE 0.9 % IV SOLN
90.0000 mg | Freq: Once | INTRAVENOUS | Status: AC
  Administered 2018-11-02: 90 mg via INTRAVENOUS
  Filled 2018-11-02: qty 14

## 2018-11-02 MED ORDER — SODIUM CHLORIDE 0.9 % IV SOLN
Freq: Once | INTRAVENOUS | Status: AC
  Administered 2018-11-02: 10:00:00 via INTRAVENOUS
  Filled 2018-11-02: qty 250

## 2018-11-02 NOTE — Patient Instructions (Signed)
Agalsidase Beta injection What is this medicine? AGALSIDASE BETA is used to replace an enzyme that is missing in patients with Fabry disease. It is not a cure. This medicine may be used for other purposes; ask your health care provider or pharmacist if you have questions. COMMON BRAND NAME(S): Fabrazyme What should I tell my health care provider before I take this medicine? They need to know if you have any of these conditions:  heart disease  an unusual or allergic reaction to agalsidase beta, mannitol, other medicines, foods, dyes, or preservatives  pregnant or trying to get pregnant  breast-feeding How should I use this medicine? This medicine is for infusion into a vein. It is given by a health care professional in a hospital or clinic setting. Talk to your pediatrician regarding the use of this medicine in children. Special care may be needed. Overdosage: If you think you have taken too much of this medicine contact a poison control center or emergency room at once. NOTE: This medicine is only for you. Do not share this medicine with others. What if I miss a dose? It is important not to miss your dose. Call your doctor or health care professional if you are unable to keep an appointment. What may interact with this medicine?  amiodarone  chloroquine  gentamicin  hydroxychloroquine  monobenzone This list may not describe all possible interactions. Give your health care provider a list of all the medicines, herbs, non-prescription drugs, or dietary supplements you use. Also tell them if you smoke, drink alcohol, or use illegal drugs. Some items may interact with your medicine. What should I watch for while using this medicine? Visit your doctor or health care professional for regular checks on your progress. Tell your doctor or healthcare professional if your symptoms do not start to get better or if they get worse. There is a registry for patients with Fabry disease. The  registry is used to gather information about the disease and its effects. Talk to your health care provider if you would like to join the registry. What side effects may I notice from receiving this medicine? Side effects that you should report to your doctor or health care professional as soon as possible:  allergic reactions like skin rash, itching or hives, swelling of the face, lips, or tongue  breathing problems  chest pain, tightness  depression  dizziness  fast, irregular heart beat  swelling of the arms or legs Side effects that usually do not require medical attention (report to your doctor or health care professional if they continue or are bothersome):  aches or pains  anxiety  fever or chills at the time of injection  headache  nausea, vomiting  stomach pain, upset This list may not describe all possible side effects. Call your doctor for medical advice about side effects. You may report side effects to FDA at 1-800-FDA-1088. Where should I keep my medicine? This drug is given in a hospital or clinic and will not be stored at home. NOTE: This sheet is a summary. It may not cover all possible information. If you have questions about this medicine, talk to your doctor, pharmacist, or health care provider.  2020 Elsevier/Gold Standard (2005-10-26 12:25:00)  

## 2018-11-09 NOTE — Progress Notes (Signed)
Patient has been receiving fabrazyme at [facility] however current prescription expires on 9/19. Per discussion with Pharmacy Director and financial department, patient will be transitioned to alternative infusion clinic for the remainder of his treatments. His last appt at the [facility] will be 9/16 infusion with future infusions scheduled at the [facility] infusion clinic in [place].

## 2018-11-16 ENCOUNTER — Other Ambulatory Visit: Payer: Self-pay

## 2018-11-16 ENCOUNTER — Inpatient Hospital Stay: Payer: Medicare Other | Attending: Hematology and Oncology

## 2018-11-16 VITALS — BP 146/87 | HR 64 | Temp 97.2°F | Resp 18 | Wt 198.9 lb

## 2018-11-16 DIAGNOSIS — E7521 Fabry (-Anderson) disease: Secondary | ICD-10-CM | POA: Diagnosis not present

## 2018-11-16 MED ORDER — SODIUM CHLORIDE 0.9 % IV SOLN
Freq: Once | INTRAVENOUS | Status: DC
  Filled 2018-11-16: qty 500

## 2018-11-16 MED ORDER — SODIUM CHLORIDE 0.9 % IV SOLN
Freq: Once | INTRAVENOUS | Status: AC
  Administered 2018-11-16: 10:00:00 via INTRAVENOUS
  Filled 2018-11-16: qty 250

## 2018-11-16 MED ORDER — ACETAMINOPHEN 500 MG PO TABS
1000.0000 mg | ORAL_TABLET | Freq: Once | ORAL | Status: AC
  Administered 2018-11-16: 10:00:00 1000 mg via ORAL
  Filled 2018-11-16: qty 2

## 2018-11-16 MED ORDER — SODIUM CHLORIDE 0.9 % IV SOLN
Freq: Once | INTRAVENOUS | Status: AC
  Administered 2018-11-16: 10:00:00 via INTRAVENOUS
  Filled 2018-11-16: qty 500

## 2018-11-16 MED ORDER — SODIUM CHLORIDE 0.9 % IV SOLN: 90.0000 mg | Freq: Once | INTRAVENOUS | Status: DC

## 2018-11-29 DIAGNOSIS — E7521 Fabry (-Anderson) disease: Secondary | ICD-10-CM | POA: Diagnosis not present

## 2018-11-30 ENCOUNTER — Inpatient Hospital Stay: Payer: Medicare Other

## 2018-12-07 IMAGING — CR DG CHEST 2V
1 series · 2 of 2 positions shown · non-contrast
Comparison: Radiographs February 01, 2015.

CLINICAL DATA: Cough.

EXAM:
CHEST  2 VIEW

[Series 1: dg chest 2 view · 0.14mm/px · 2 of 2 slices shown]
[im 1/2]
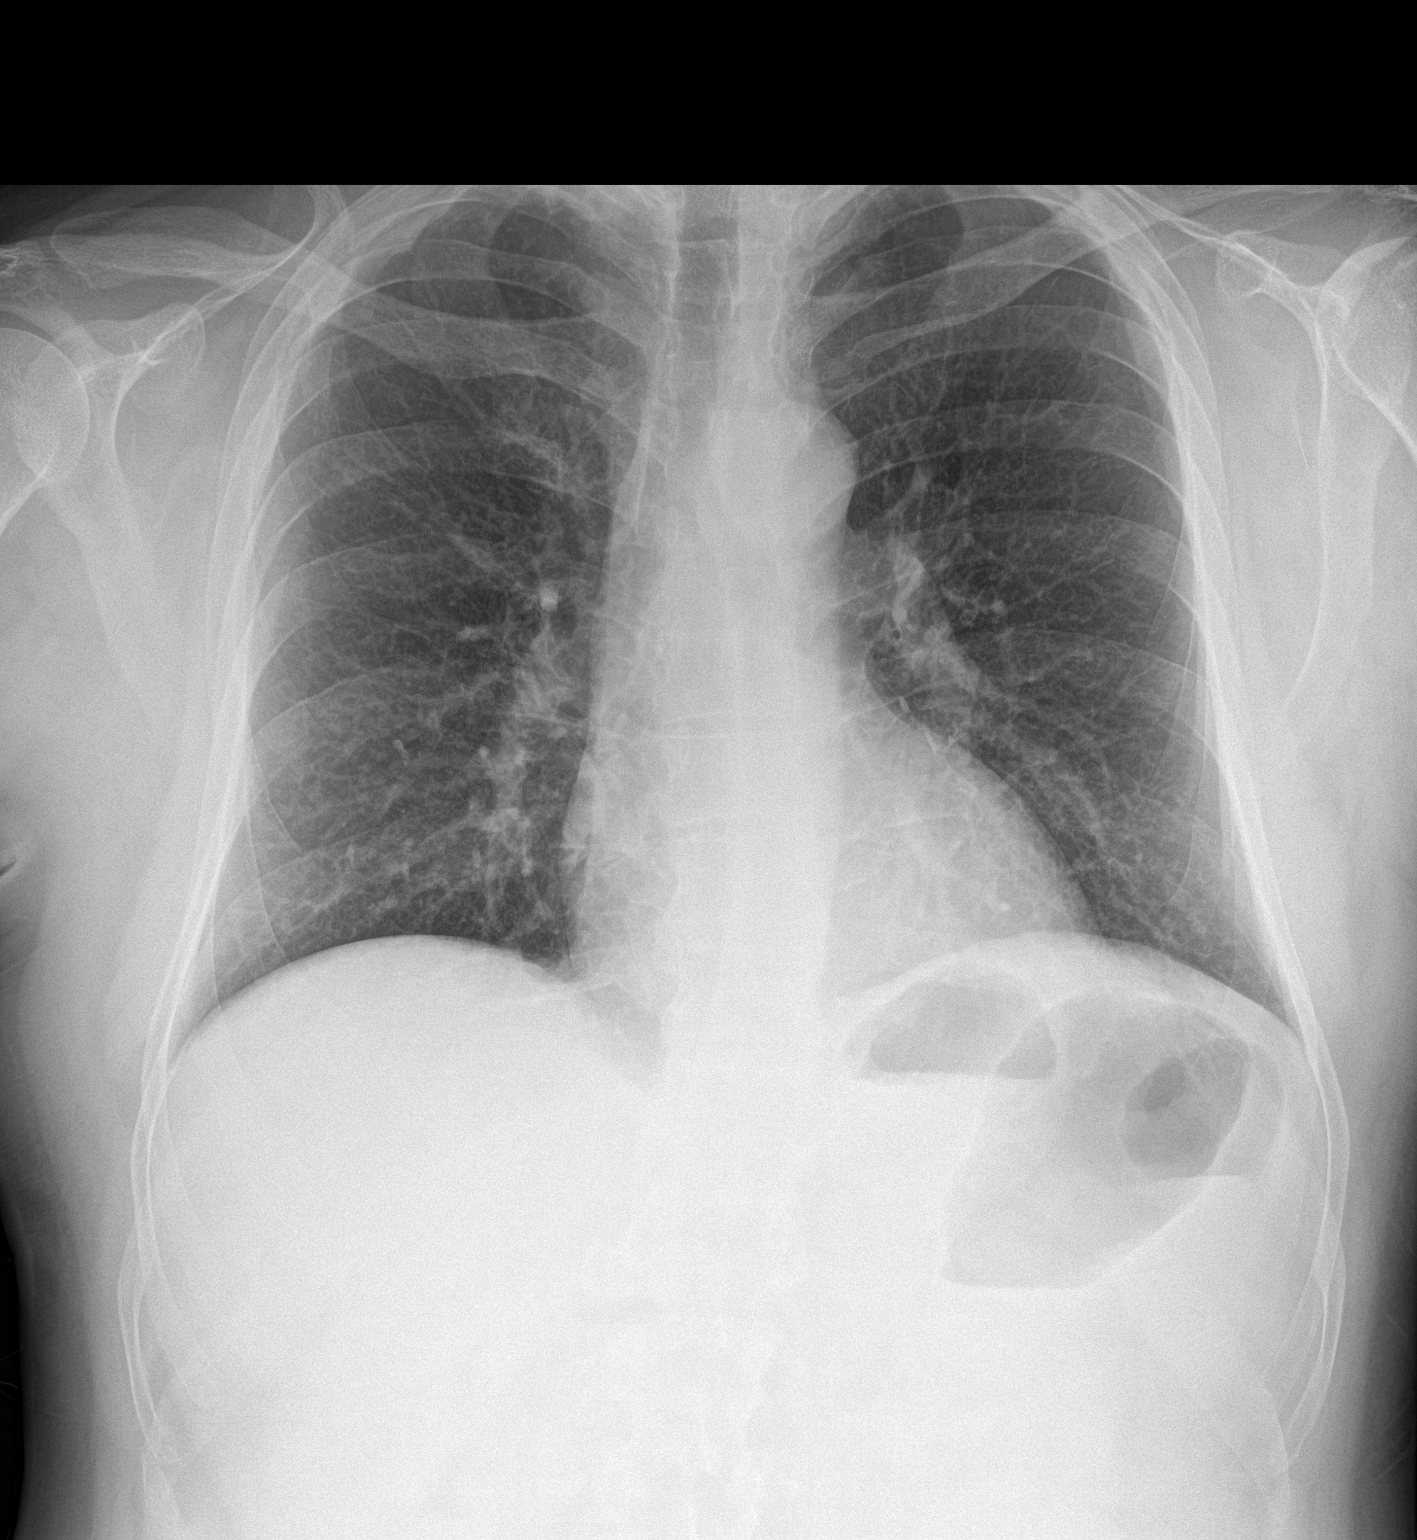
[im 2/2]
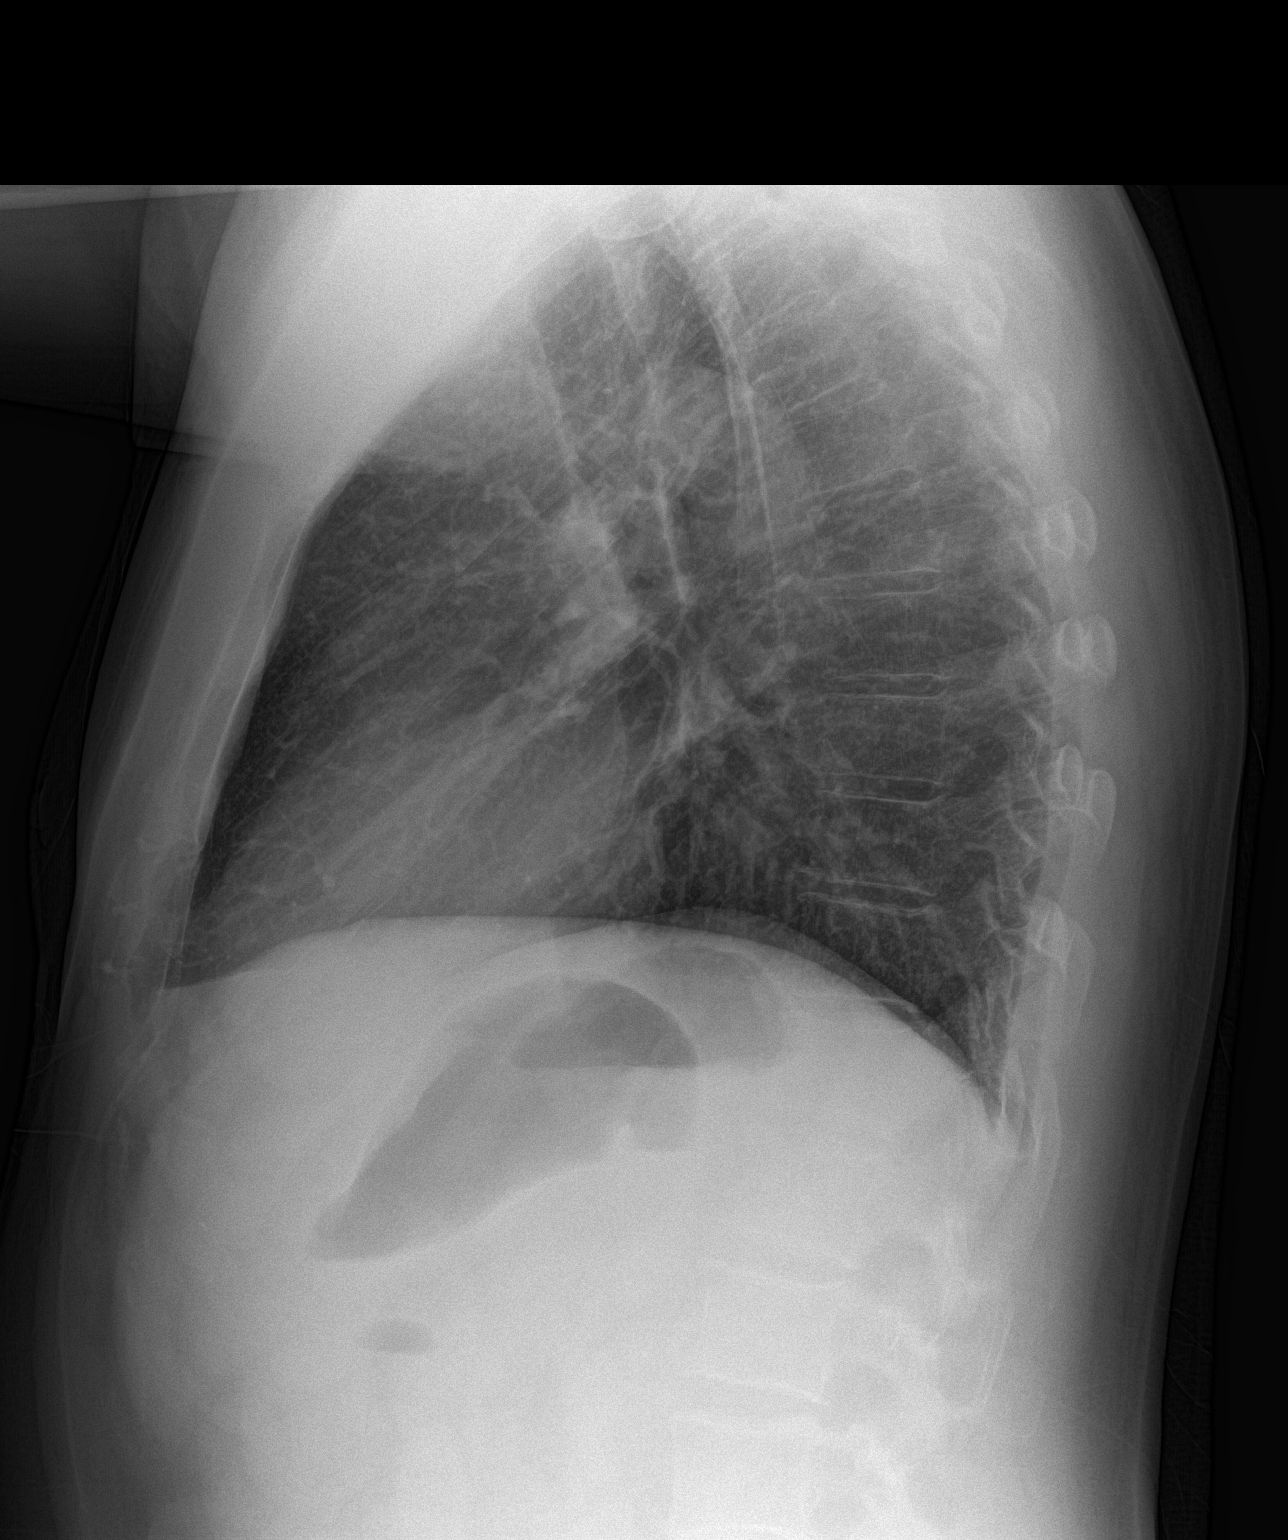

[2 of 2 positions shown; findings below may reference images not displayed]

FINDINGS: The heart size and mediastinal contours are within normal limits.
Both lungs are clear. No pneumothorax or pleural effusion is noted.
The visualized skeletal structures are unremarkable.
IMPRESSION: No active cardiopulmonary disease.

## 2018-12-08 DIAGNOSIS — E7521 Fabry (-Anderson) disease: Secondary | ICD-10-CM | POA: Diagnosis not present

## 2018-12-14 ENCOUNTER — Inpatient Hospital Stay: Payer: Medicare Other

## 2018-12-20 DIAGNOSIS — E7521 Fabry (-Anderson) disease: Secondary | ICD-10-CM | POA: Diagnosis not present

## 2018-12-28 ENCOUNTER — Ambulatory Visit: Payer: Medicare Other | Admitting: Hematology and Oncology

## 2018-12-28 ENCOUNTER — Ambulatory Visit: Payer: Medicare Other

## 2019-01-03 DIAGNOSIS — E7521 Fabry (-Anderson) disease: Secondary | ICD-10-CM | POA: Diagnosis not present

## 2019-01-17 DIAGNOSIS — E7521 Fabry (-Anderson) disease: Secondary | ICD-10-CM | POA: Diagnosis not present

## 2019-01-31 DIAGNOSIS — E7521 Fabry (-Anderson) disease: Secondary | ICD-10-CM | POA: Diagnosis not present

## 2019-02-09 DIAGNOSIS — R809 Proteinuria, unspecified: Secondary | ICD-10-CM | POA: Diagnosis not present

## 2019-02-09 DIAGNOSIS — N1832 Chronic kidney disease, stage 3b: Secondary | ICD-10-CM | POA: Diagnosis not present

## 2019-02-09 DIAGNOSIS — N2581 Secondary hyperparathyroidism of renal origin: Secondary | ICD-10-CM | POA: Diagnosis not present

## 2019-02-09 DIAGNOSIS — E7521 Fabry (-Anderson) disease: Secondary | ICD-10-CM | POA: Diagnosis not present

## 2019-02-09 DIAGNOSIS — I10 Essential (primary) hypertension: Secondary | ICD-10-CM | POA: Diagnosis not present

## 2019-03-14 DIAGNOSIS — E7521 Fabry (-Anderson) disease: Secondary | ICD-10-CM | POA: Diagnosis not present

## 2019-03-28 DIAGNOSIS — E7521 Fabry (-Anderson) disease: Secondary | ICD-10-CM | POA: Diagnosis not present

## 2019-04-11 DIAGNOSIS — E7521 Fabry (-Anderson) disease: Secondary | ICD-10-CM | POA: Diagnosis not present

## 2019-04-25 DIAGNOSIS — E7521 Fabry (-Anderson) disease: Secondary | ICD-10-CM | POA: Diagnosis not present

## 2019-05-09 DIAGNOSIS — E7521 Fabry (-Anderson) disease: Secondary | ICD-10-CM | POA: Diagnosis not present

## 2019-05-23 DIAGNOSIS — E7521 Fabry (-Anderson) disease: Secondary | ICD-10-CM | POA: Diagnosis not present

## 2019-05-28 ENCOUNTER — Ambulatory Visit: Payer: Medicare Other | Attending: Internal Medicine

## 2019-05-28 DIAGNOSIS — Z23 Encounter for immunization: Secondary | ICD-10-CM

## 2019-05-28 NOTE — Progress Notes (Signed)
   Covid-19 Vaccination Clinic  Name:  Jeffery Hughes    MRN: [id number] DOB: [birth year]  05/28/2019  [name] was observed post Covid-19 immunization for 15 minutes without incident. He was provided with Vaccine Information Sheet and instruction to access the V-Safe system.   [name] was instructed to call 911 with any severe reactions post vaccine: Marland Kitchen Difficulty breathing  . Swelling of face and throat  . A fast heartbeat  . A bad rash all over body  . Dizziness and weakness   Immunizations Administered    Name Date Dose VIS Date Route   Pfizer COVID-19 Vaccine 05/28/2019  8:23 AM 0.3 mL 02/10/2019 Intramuscular   Manufacturer: [place]   Lot: [id number]   [name]: [id number]

## 2019-06-06 DIAGNOSIS — E7521 Fabry (-Anderson) disease: Secondary | ICD-10-CM | POA: Diagnosis not present

## 2019-06-12 DIAGNOSIS — E7521 Fabry (-Anderson) disease: Secondary | ICD-10-CM | POA: Diagnosis not present

## 2019-06-12 DIAGNOSIS — R809 Proteinuria, unspecified: Secondary | ICD-10-CM | POA: Diagnosis not present

## 2019-06-12 DIAGNOSIS — I10 Essential (primary) hypertension: Secondary | ICD-10-CM | POA: Diagnosis not present

## 2019-06-12 DIAGNOSIS — N2581 Secondary hyperparathyroidism of renal origin: Secondary | ICD-10-CM | POA: Diagnosis not present

## 2019-06-12 DIAGNOSIS — N1832 Chronic kidney disease, stage 3b: Secondary | ICD-10-CM | POA: Diagnosis not present

## 2019-06-20 DIAGNOSIS — E7521 Fabry (-Anderson) disease: Secondary | ICD-10-CM | POA: Diagnosis not present

## 2019-06-21 ENCOUNTER — Ambulatory Visit: Payer: Medicare Other | Attending: Internal Medicine

## 2019-06-21 DIAGNOSIS — Z23 Encounter for immunization: Secondary | ICD-10-CM

## 2019-06-21 NOTE — Progress Notes (Signed)
   Covid-19 Vaccination Clinic  Name:  Jeffery Hughes    MRN: [id number] DOB: [birth year]  06/21/2019  [name] was observed post Covid-19 immunization for 15 minutes without incident. He was provided with Vaccine Information Sheet and instruction to access the V-Safe system.   [name] was instructed to call 911 with any severe reactions post vaccine: Marland Kitchen Difficulty breathing  . Swelling of face and throat  . A fast heartbeat  . A bad rash all over body  . Dizziness and weakness   Immunizations Administered    Name Date Dose VIS Date Route   Pfizer COVID-19 Vaccine 06/21/2019  8:46 AM 0.3 mL 04/26/2018 Intramuscular   Manufacturer: [place]   Lot: [id number]   [place]: [id number]

## 2019-07-04 DIAGNOSIS — E7521 Fabry (-Anderson) disease: Secondary | ICD-10-CM | POA: Diagnosis not present

## 2019-07-18 DIAGNOSIS — E7521 Fabry (-Anderson) disease: Secondary | ICD-10-CM | POA: Diagnosis not present

## 2019-09-05 DIAGNOSIS — E7521 Fabry (-Anderson) disease: Secondary | ICD-10-CM | POA: Diagnosis not present

## 2019-09-18 DIAGNOSIS — R809 Proteinuria, unspecified: Secondary | ICD-10-CM | POA: Diagnosis not present

## 2019-09-18 DIAGNOSIS — N2581 Secondary hyperparathyroidism of renal origin: Secondary | ICD-10-CM | POA: Diagnosis not present

## 2019-09-18 DIAGNOSIS — I10 Essential (primary) hypertension: Secondary | ICD-10-CM | POA: Diagnosis not present

## 2019-09-18 DIAGNOSIS — N1832 Chronic kidney disease, stage 3b: Secondary | ICD-10-CM | POA: Diagnosis not present

## 2019-09-18 DIAGNOSIS — E7521 Fabry (-Anderson) disease: Secondary | ICD-10-CM | POA: Diagnosis not present

## 2019-09-19 DIAGNOSIS — E7521 Fabry (-Anderson) disease: Secondary | ICD-10-CM | POA: Diagnosis not present

## 2019-10-03 DIAGNOSIS — E7521 Fabry (-Anderson) disease: Secondary | ICD-10-CM | POA: Diagnosis not present

## 2019-10-05 DIAGNOSIS — N1832 Chronic kidney disease, stage 3b: Secondary | ICD-10-CM | POA: Diagnosis not present

## 2019-10-05 DIAGNOSIS — I10 Essential (primary) hypertension: Secondary | ICD-10-CM | POA: Diagnosis not present

## 2019-10-17 DIAGNOSIS — E7521 Fabry (-Anderson) disease: Secondary | ICD-10-CM | POA: Diagnosis not present

## 2019-10-31 DIAGNOSIS — E7521 Fabry (-Anderson) disease: Secondary | ICD-10-CM | POA: Diagnosis not present

## 2019-11-14 DIAGNOSIS — E7521 Fabry (-Anderson) disease: Secondary | ICD-10-CM | POA: Diagnosis not present

## 2019-11-28 DIAGNOSIS — E7521 Fabry (-Anderson) disease: Secondary | ICD-10-CM | POA: Diagnosis not present

## 2019-12-07 DIAGNOSIS — Z23 Encounter for immunization: Secondary | ICD-10-CM | POA: Diagnosis not present

## 2019-12-19 DIAGNOSIS — E7521 Fabry (-Anderson) disease: Secondary | ICD-10-CM | POA: Diagnosis not present

## 2020-01-02 DIAGNOSIS — E7521 Fabry (-Anderson) disease: Secondary | ICD-10-CM | POA: Diagnosis not present

## 2020-01-16 DIAGNOSIS — E7521 Fabry (-Anderson) disease: Secondary | ICD-10-CM | POA: Diagnosis not present

## 2020-01-20 DIAGNOSIS — Z23 Encounter for immunization: Secondary | ICD-10-CM | POA: Diagnosis not present

## 2020-02-06 DIAGNOSIS — E7521 Fabry (-Anderson) disease: Secondary | ICD-10-CM | POA: Diagnosis not present

## 2020-02-12 DIAGNOSIS — N184 Chronic kidney disease, stage 4 (severe): Secondary | ICD-10-CM | POA: Diagnosis not present

## 2020-02-12 DIAGNOSIS — I10 Essential (primary) hypertension: Secondary | ICD-10-CM | POA: Diagnosis not present

## 2020-02-12 DIAGNOSIS — E7521 Fabry (-Anderson) disease: Secondary | ICD-10-CM | POA: Diagnosis not present

## 2020-02-12 DIAGNOSIS — R809 Proteinuria, unspecified: Secondary | ICD-10-CM | POA: Diagnosis not present

## 2020-02-12 DIAGNOSIS — N2581 Secondary hyperparathyroidism of renal origin: Secondary | ICD-10-CM | POA: Diagnosis not present

## 2020-02-20 DIAGNOSIS — E7521 Fabry (-Anderson) disease: Secondary | ICD-10-CM | POA: Diagnosis not present

## 2020-03-03 ENCOUNTER — Ambulatory Visit
Admission: EM | Admit: 2020-03-03 | Discharge: 2020-03-03 | Discharge status: Against Medical Advice | Payer: Medicare Other

## 2020-03-03 ENCOUNTER — Other Ambulatory Visit: Payer: Self-pay

## 2020-03-05 DIAGNOSIS — E7521 Fabry (-Anderson) disease: Secondary | ICD-10-CM | POA: Diagnosis not present

## 2020-03-19 DIAGNOSIS — E7521 Fabry (-Anderson) disease: Secondary | ICD-10-CM | POA: Diagnosis not present

## 2020-03-22 ENCOUNTER — Ambulatory Visit: Payer: Medicare Other | Admitting: Cardiovascular Disease

## 2020-03-29 ENCOUNTER — Telehealth: Payer: Self-pay | Admitting: Cardiovascular Disease

## 2020-03-29 NOTE — Telephone Encounter (Signed)
3 attempts to schedule fu appt from recall list.   Deleting recall.   

## 2020-04-02 DIAGNOSIS — E7521 Fabry (-Anderson) disease: Secondary | ICD-10-CM | POA: Diagnosis not present

## 2020-04-16 DIAGNOSIS — E7521 Fabry (-Anderson) disease: Secondary | ICD-10-CM | POA: Diagnosis not present

## 2020-04-30 DIAGNOSIS — E7521 Fabry (-Anderson) disease: Secondary | ICD-10-CM | POA: Diagnosis not present

## 2020-05-14 DIAGNOSIS — E7521 Fabry (-Anderson) disease: Secondary | ICD-10-CM | POA: Diagnosis not present

## 2020-05-28 DIAGNOSIS — E7521 Fabry (-Anderson) disease: Secondary | ICD-10-CM | POA: Diagnosis not present

## 2020-06-11 DIAGNOSIS — E7521 Fabry (-Anderson) disease: Secondary | ICD-10-CM | POA: Diagnosis not present

## 2020-06-25 DIAGNOSIS — E7521 Fabry (-Anderson) disease: Secondary | ICD-10-CM | POA: Diagnosis not present

## 2020-06-27 DIAGNOSIS — N184 Chronic kidney disease, stage 4 (severe): Secondary | ICD-10-CM | POA: Diagnosis not present

## 2020-06-27 DIAGNOSIS — E7521 Fabry (-Anderson) disease: Secondary | ICD-10-CM | POA: Diagnosis not present

## 2020-06-27 DIAGNOSIS — N2581 Secondary hyperparathyroidism of renal origin: Secondary | ICD-10-CM | POA: Diagnosis not present

## 2020-06-27 DIAGNOSIS — R809 Proteinuria, unspecified: Secondary | ICD-10-CM | POA: Diagnosis not present

## 2020-06-27 DIAGNOSIS — I10 Essential (primary) hypertension: Secondary | ICD-10-CM | POA: Diagnosis not present

## 2020-07-09 DIAGNOSIS — E7521 Fabry (-Anderson) disease: Secondary | ICD-10-CM | POA: Diagnosis not present

## 2020-07-16 DIAGNOSIS — Z23 Encounter for immunization: Secondary | ICD-10-CM | POA: Diagnosis not present

## 2020-07-30 DIAGNOSIS — E7521 Fabry (-Anderson) disease: Secondary | ICD-10-CM | POA: Diagnosis not present

## 2020-08-13 DIAGNOSIS — E7521 Fabry (-Anderson) disease: Secondary | ICD-10-CM | POA: Diagnosis not present

## 2020-08-27 DIAGNOSIS — E7521 Fabry (-Anderson) disease: Secondary | ICD-10-CM | POA: Diagnosis not present

## 2020-09-10 DIAGNOSIS — E7521 Fabry (-Anderson) disease: Secondary | ICD-10-CM | POA: Diagnosis not present

## 2020-09-24 DIAGNOSIS — E7521 Fabry (-Anderson) disease: Secondary | ICD-10-CM | POA: Diagnosis not present

## 2020-10-04 DIAGNOSIS — E7521 Fabry (-Anderson) disease: Secondary | ICD-10-CM | POA: Diagnosis not present

## 2020-10-04 DIAGNOSIS — N189 Chronic kidney disease, unspecified: Secondary | ICD-10-CM | POA: Diagnosis not present

## 2020-10-04 DIAGNOSIS — I10 Essential (primary) hypertension: Secondary | ICD-10-CM | POA: Diagnosis not present

## 2020-10-08 DIAGNOSIS — E7521 Fabry (-Anderson) disease: Secondary | ICD-10-CM | POA: Diagnosis not present

## 2020-10-22 DIAGNOSIS — E7521 Fabry (-Anderson) disease: Secondary | ICD-10-CM | POA: Diagnosis not present

## 2020-10-31 DIAGNOSIS — N184 Chronic kidney disease, stage 4 (severe): Secondary | ICD-10-CM | POA: Diagnosis not present

## 2020-10-31 DIAGNOSIS — R809 Proteinuria, unspecified: Secondary | ICD-10-CM | POA: Diagnosis not present

## 2020-10-31 DIAGNOSIS — I10 Essential (primary) hypertension: Secondary | ICD-10-CM | POA: Diagnosis not present

## 2020-10-31 DIAGNOSIS — E7521 Fabry (-Anderson) disease: Secondary | ICD-10-CM | POA: Diagnosis not present

## 2020-10-31 DIAGNOSIS — N2581 Secondary hyperparathyroidism of renal origin: Secondary | ICD-10-CM | POA: Diagnosis not present

## 2020-11-05 DIAGNOSIS — E7521 Fabry (-Anderson) disease: Secondary | ICD-10-CM | POA: Diagnosis not present

## 2020-11-19 DIAGNOSIS — E7521 Fabry (-Anderson) disease: Secondary | ICD-10-CM | POA: Diagnosis not present

## 2020-11-20 NOTE — Progress Notes (Signed)
Cardiology Office Note  Date:  11/22/2020   ID:  Jeffery Hughes, DOB 05-21-1965, MRN [id number]  PCP:  [facility]   Chief Complaint  Patient presents with   Follow-up    Patient doing well.     HPI:  Jeffery Hughes is a 55 y.o. 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  He is currently infused enzyme replacement therapy (ERT) at the dose of 80 mg per infusion every 2 weeks  Infusion makes him feel tired Active at baseline, denies any significant shortness of breath or chest pain on exertion Active outside Tries not to "overdo", gets h/a, tired  Lab work reviewed Cr 2.5 to 3.5 in the past year Followed by Dr. Holley Raring  Occasional orthostasis sx Tries to get up slowly Remains on losartan 100 daily, Lasix 40 daily managed by nephrology Minimal lower extremity edema Has not checked orthostatics at home  Remote lipid panel Total chol 255, LDL 125  EKG personally reviewed by myself on todays visit Shows normal sinus rhythm rate 74 bpm, LVH with repolarization abnormality  cardiac MRI on 01/05/17 that showed  severe concentric LVH, right ventricle wall thickening, mildly increased aortic root. Details as below  severe concentric hypertrophy with a maximum wall thickness of 2.0 cm. The LV apex is particularly hypertrophied. Global systolic function is normal with a calculated LV ejection fraction of 60%.  diastolic dysfunction   2. The right ventricle is normal in cavity size.   mild hypertrophy of the RV wall (particularly the basal portion) with normal systolic function.  3.  Both atria are normal in size.  4. The aortic root is mildly increased in caliber at 3.9 cm in diameter. The aortic valve is trileaflet in morphology. There is no significant aortic valve stenosis or regurgitation. There are no other significant valvular lesions.  5. Delayed enhancement imaging is mildly abnormal.  consistent with that seen  with Fabry's disease.    PMH:   has a past medical history of CKD (chronic kidney disease), stage II, Fabry disease ([place]), Fabry disease ([place]), Hypertension, LVH (left ventricular hypertrophy), Mixed hyperlipidemia, and Vitamin D deficiency.  PSH:    Past Surgical History:  Procedure Laterality Date   KNEE SURGERY Left    20+ years ago, 6+ years ago   NO PAST SURGERIES      Current Outpatient Medications  Medication Sig Dispense Refill   aspirin 81 MG tablet Take 81 mg by mouth daily.     cyanocobalamin 1000 MCG tablet Take 100 mcg by mouth daily.     diphenoxylate-atropine (LOMOTIL) 2.5-0.025 MG per tablet Take 2 tablets by mouth 4 (four) times daily as needed.      furosemide (LASIX) 40 MG tablet Take 40 mg by mouth daily.     loratadine (CLARITIN) 10 MG tablet Take 10 mg by mouth.     losartan (COZAAR) 100 MG tablet Take 50 mg by mouth daily.     Multiple Vitamin (MULTIVITAMIN) tablet Take 1 tablet by mouth daily.     potassium chloride SA (K-DUR,KLOR-CON) 20 MEQ tablet Take 20 mEq by mouth 2 (two) times daily.  No current facility-administered medications for this visit.   Facility-Administered Medications Ordered in Other Visits  Medication Dose Route Frequency Provider Last Rate Last Admin   acetaminophen (TYLENOL) tablet 1,000 mg  1,000 mg Oral Once Lloyd Huger, MD         Allergies:   Other and Penicillins   Social History:  The patient  reports that he has never smoked. He has never used smokeless tobacco. He reports that he does not drink alcohol and does not use drugs.   Family History:   family history includes  CAD in his father and mother; Heart attack in his mother; Skin cancer in his father; Stroke in his mother.    Review of Systems: Review of Systems  Constitutional: Negative.   Respiratory: Negative.    Cardiovascular: Negative.   Gastrointestinal: Negative.   Musculoskeletal: Negative.   Neurological:  Positive for dizziness.  Psychiatric/Behavioral: Negative.    All other systems reviewed and are negative.   PHYSICAL EXAM: VS:  BP 130/74   Pulse 63   Ht '5\' 9"'$  (1.753 m)   Wt 198 lb 6.4 oz (90 kg)   SpO2 98%   BMI 29.30 kg/m  , BMI Body mass index is 29.3 kg/m. GEN: Well nourished, well developed, in no acute distress  HEENT: normal  Neck: no JVD, carotid bruits, or masses Cardiac: RRR; no murmurs, rubs, or gallops,no edema  Respiratory:  clear to auscultation bilaterally, normal work of breathing GI: soft, nontender, nondistended, + BS MS: no deformity or atrophy  Skin: warm and dry, no rash Neuro:  Strength and sensation are intact Psych: euthymic mood, full affect   Recent Labs: No results found for requested labs within last 8760 hours.    Lipid Panel No results found for: CHOL, HDL, LDLCALC, TRIG    Wt Readings from Last 3 Encounters:  11/22/20 198 lb 6.4 oz (90 kg)  11/16/18 198 lb 13.7 oz (90.2 kg)  11/02/18 197 lb 1.5 oz (89.4 kg)       ASSESSMENT AND PLAN:  Fabry disease ([place]) Long history over 15 years, receiving medication for the direct treatment of Fabry disease chronic kidney disease, creatinine up to 3.5  Ascending aorta dilation ([place]) - Plan: EKG 12-Lead 3.9 cm, will need periodic monitoring  CKD (chronic kidney disease) stage 3, GFR 30-59 ml/min (HCC) Stable creatinine 1.7 Denies lower extremity edema, mouth always feels dry Recommended he try Lasix 40 alternating with 20 mg Suggested he discuss slight decrease in his diuretics with nephrology  Hyperlipidemia Total cholesterol 250 on average No mention of coronary calcification or  aortic atherosclerosis on recent MRI He is trying to watch his diet, exercise  LVH on MRI 2018  less impressive on echocardiogram 2016 EKG consistent with LVH and strain Reports he is asymptomatic No significant murmur on exam, some orthostasis symptoms through the summer, has been maintained on Lasix daily losartan 100 Recommend to check orthostatics at home, Could try losartan 50 twice daily, will drop the dose down for orthostasis symptoms.  May need to hold Lasix periodically for orthostasis in the summer   Total encounter time more than 25 minutes  Greater than 50% was spent in counseling and coordination of care with the patient   No orders of the defined types were placed in this encounter.    Signed, Esmond Plants, M.D., Ph.D. 11/22/2020  [place], [place]

## 2020-11-22 ENCOUNTER — Encounter: Payer: Self-pay | Admitting: Cardiovascular Disease

## 2020-11-22 ENCOUNTER — Other Ambulatory Visit: Payer: Self-pay

## 2020-11-22 ENCOUNTER — Ambulatory Visit (INDEPENDENT_AMBULATORY_CARE_PROVIDER_SITE_OTHER): Payer: Medicare Other | Admitting: Cardiovascular Disease

## 2020-11-22 VITALS — BP 130/74 | HR 63 | Ht 69.0 in | Wt 198.4 lb

## 2020-11-22 DIAGNOSIS — I10 Essential (primary) hypertension: Secondary | ICD-10-CM

## 2020-11-22 DIAGNOSIS — E7521 Fabry (-Anderson) disease: Secondary | ICD-10-CM

## 2020-11-22 DIAGNOSIS — I77810 Thoracic aortic ectasia: Secondary | ICD-10-CM

## 2020-11-22 NOTE — Patient Instructions (Signed)
Medication Instructions:  No changes  If you need a refill on your cardiac medications before your next appointment, please call your pharmacy.    Lab work: No new labs needed   If you have labs (blood work) drawn today and your tests are completely normal, you will receive your results only by: MyChart Message (if you have MyChart) OR A paper copy in the mail If you have any lab test that is abnormal or we need to change your treatment, we will call you to review the results.   Testing/Procedures: No new testing needed   Follow-Up: At [facility], you and your health needs are our priority.  As part of our continuing mission to provide you with exceptional heart care, we have created designated Provider Care Teams.  These Care Teams include your primary Cardiologist (physician) and Advanced Practice Providers (APPs -  Physician Assistants and Nurse Practitioners) who all work together to provide you with the care you need, when you need it.  You will need a follow up appointment in 12 months  Providers on your designated Care Team:   Christopher Berge, NP Ryan Dunn, PA-C Jacquelyn Visser, PA-C Cadence Furth, PA-C  Any Other Special Instructions Will Be Listed Below (If Applicable).  COVID-19 Vaccine Information can be found at: [email or web address] For questions related to vaccine distribution or appointments, please email [email or web address] or call [phone].    

## 2020-11-25 DIAGNOSIS — Z23 Encounter for immunization: Secondary | ICD-10-CM | POA: Diagnosis not present

## 2020-12-04 DIAGNOSIS — E7521 Fabry (-Anderson) disease: Secondary | ICD-10-CM | POA: Diagnosis not present

## 2020-12-17 DIAGNOSIS — E7521 Fabry (-Anderson) disease: Secondary | ICD-10-CM | POA: Diagnosis not present

## 2020-12-18 DIAGNOSIS — Z23 Encounter for immunization: Secondary | ICD-10-CM | POA: Diagnosis not present

## 2020-12-31 DIAGNOSIS — E7521 Fabry (-Anderson) disease: Secondary | ICD-10-CM | POA: Diagnosis not present

## 2021-01-14 DIAGNOSIS — E7521 Fabry (-Anderson) disease: Secondary | ICD-10-CM | POA: Diagnosis not present

## 2021-01-28 DIAGNOSIS — E7521 Fabry (-Anderson) disease: Secondary | ICD-10-CM | POA: Diagnosis not present

## 2021-02-04 DIAGNOSIS — R809 Proteinuria, unspecified: Secondary | ICD-10-CM | POA: Diagnosis not present

## 2021-02-04 DIAGNOSIS — I10 Essential (primary) hypertension: Secondary | ICD-10-CM | POA: Diagnosis not present

## 2021-02-04 DIAGNOSIS — N2581 Secondary hyperparathyroidism of renal origin: Secondary | ICD-10-CM | POA: Diagnosis not present

## 2021-02-04 DIAGNOSIS — E7521 Fabry (-Anderson) disease: Secondary | ICD-10-CM | POA: Diagnosis not present

## 2021-02-04 DIAGNOSIS — N184 Chronic kidney disease, stage 4 (severe): Secondary | ICD-10-CM | POA: Diagnosis not present

## 2021-02-11 DIAGNOSIS — E7521 Fabry (-Anderson) disease: Secondary | ICD-10-CM | POA: Diagnosis not present

## 2021-02-17 ENCOUNTER — Encounter: Payer: Self-pay | Admitting: Internal Medicine

## 2021-02-17 ENCOUNTER — Other Ambulatory Visit: Payer: Self-pay

## 2021-02-17 ENCOUNTER — Ambulatory Visit (INDEPENDENT_AMBULATORY_CARE_PROVIDER_SITE_OTHER): Payer: Medicare Other | Admitting: Internal Medicine

## 2021-02-17 VITALS — BP 137/63 | HR 63 | Temp 97.7°F | Resp 18 | Ht 69.0 in | Wt 193.0 lb

## 2021-02-17 DIAGNOSIS — E781 Pure hyperglyceridemia: Secondary | ICD-10-CM | POA: Diagnosis not present

## 2021-02-17 DIAGNOSIS — I10 Essential (primary) hypertension: Secondary | ICD-10-CM | POA: Diagnosis not present

## 2021-02-17 DIAGNOSIS — Z6828 Body mass index (BMI) 28.0-28.9, adult: Secondary | ICD-10-CM | POA: Insufficient documentation

## 2021-02-17 DIAGNOSIS — N184 Chronic kidney disease, stage 4 (severe): Secondary | ICD-10-CM | POA: Diagnosis not present

## 2021-02-17 DIAGNOSIS — E7521 Fabry (-Anderson) disease: Secondary | ICD-10-CM

## 2021-02-17 DIAGNOSIS — E663 Overweight: Secondary | ICD-10-CM | POA: Diagnosis not present

## 2021-02-17 MED ORDER — DIPHENOXYLATE-ATROPINE 2.5-0.025 MG PO TABS: 2.0000 | ORAL_TABLET | Freq: Four times a day (QID) | ORAL | 1 refills | Status: DC | PRN

## 2021-02-17 MED ORDER — POTASSIUM CHLORIDE CRYS ER 20 MEQ PO TBCR: 20.0000 meq | EXTENDED_RELEASE_TABLET | Freq: Two times a day (BID) | ORAL | 1 refills | Status: DC

## 2021-02-17 NOTE — Assessment & Plan Note (Signed)
Encouraged diet and exercise for weight loss ?

## 2021-02-17 NOTE — Assessment & Plan Note (Signed)
Controlled on Losartan and Lasix Kidney function reviewed Encouraged DASH die and exercise for weight loss

## 2021-02-17 NOTE — Patient Instructions (Signed)
Heart-Healthy Eating Plan Heart-healthy meal planning includes: Eating less unhealthy fats. Eating more healthy fats. Making other changes in your diet. Talk with your doctor or a diet specialist (dietitian) to create an eating plan that is right for you. What is my plan? Your doctor may recommend an eating plan that includes: Total fat: ______% or less of total calories a day. Saturated fat: ______% or less of total calories a day. Cholesterol: less than _________mg a day. What are tips for following this plan? Cooking Avoid frying your food. Try to bake, boil, grill, or broil it instead. You can also reduce fat by: Removing the skin from poultry. Removing all visible fats from meats. Steaming vegetables in water or broth. Meal planning  At meals, divide your plate into four equal parts: Fill one-half of your plate with vegetables and green salads. Fill one-fourth of your plate with whole grains. Fill one-fourth of your plate with lean protein foods. Eat 4-5 servings of vegetables per day. A serving of vegetables is: 1 cup of raw or cooked vegetables. 2 cups of raw leafy greens. Eat 4-5 servings of fruit per day. A serving of fruit is: 1 medium whole fruit.  cup of dried fruit.  cup of fresh, frozen, or canned fruit.  cup of 100% fruit juice. Eat more foods that have soluble fiber. These are apples, broccoli, carrots, beans, peas, and barley. Try to get 20-30 g of fiber per day. Eat 4-5 servings of nuts, legumes, and seeds per week: 1 serving of dried beans or legumes equals  cup after being cooked. 1 serving of nuts is  cup. 1 serving of seeds equals 1 tablespoon. General information Eat more home-cooked food. Eat less restaurant, buffet, and fast food. Limit or avoid alcohol. Limit foods that are high in starch and sugar. Avoid fried foods. Lose weight if you are overweight. Keep track of how much salt (sodium) you eat. This is important if you have high blood  pressure. Ask your doctor to tell you more about this. Try to add vegetarian meals each week. Fats Choose healthy fats. These include olive oil and canola oil, flaxseeds, walnuts, almonds, and seeds. Eat more omega-3 fats. These include salmon, mackerel, sardines, tuna, flaxseed oil, and ground flaxseeds. Try to eat fish at least 2 times each week. Check food labels. Avoid foods with trans fats or high amounts of saturated fat. Limit saturated fats. These are often found in animal products, such as meats, butter, and cream. These are also found in plant foods, such as palm oil, palm kernel oil, and coconut oil. Avoid foods with partially hydrogenated oils in them. These have trans fats. Examples are stick margarine, some tub margarines, cookies, crackers, and other baked goods. What foods can I eat? Fruits All fresh, canned (in natural juice), or frozen fruits. Vegetables Fresh or frozen vegetables (raw, steamed, roasted, or grilled). Green salads. Grains Most grains. Choose whole wheat and whole grains most of the time. Rice and pasta, including brown rice and pastas made with whole wheat. Meats and other proteins Lean, well-trimmed beef, veal, pork, and lamb. Chicken and turkey without skin. All fish and shellfish. Wild duck, rabbit, pheasant, and venison. Egg whites or low-cholesterol egg substitutes. Dried beans, peas, lentils, and tofu. Seeds and most nuts. Dairy Low-fat or nonfat cheeses, including ricotta and mozzarella. Skim or 1% milk that is liquid, powdered, or evaporated. Buttermilk that is made with low-fat milk. Nonfat or low-fat yogurt. Fats and oils Non-hydrogenated (trans-free) margarines. Vegetable oils, including   soybean, sesame, sunflower, olive, peanut, safflower, corn, canola, and cottonseed. Salad dressings or mayonnaise made with a vegetable oil. Beverages Mineral water. Coffee and tea. Diet carbonated beverages. Sweets and desserts Sherbet, gelatin, and fruit ice.  Small amounts of dark chocolate. Limit all sweets and desserts. Seasonings and condiments All seasonings and condiments. The items listed above may not be a complete list of foods and drinks you can eat. Contact a dietitian for more options. What foods should I avoid? Fruits Canned fruit in heavy syrup. Fruit in cream or butter sauce. Fried fruit. Limit coconut. Vegetables Vegetables cooked in cheese, cream, or butter sauce. Fried vegetables. Grains Breads that are made with saturated or trans fats, oils, or whole milk. Croissants. Sweet rolls. Donuts. High-fat crackers, such as cheese crackers. Meats and other proteins Fatty meats, such as hot dogs, ribs, sausage, bacon, rib-eye roast or steak. High-fat deli meats, such as salami and bologna. Caviar. Domestic duck and goose. Organ meats, such as liver. Dairy Cream, sour cream, cream cheese, and creamed cottage cheese. Whole-milk cheeses. Whole or 2% milk that is liquid, evaporated, or condensed. Whole buttermilk. Cream sauce or high-fat cheese sauce. Yogurt that is made from whole milk. Fats and oils Meat fat, or shortening. Cocoa butter, hydrogenated oils, palm oil, coconut oil, palm kernel oil. Solid fats and shortenings, including bacon fat, salt pork, lard, and butter. Nondairy cream substitutes. Salad dressings with cheese or sour cream. Beverages Regular sodas and juice drinks with added sugar. Sweets and desserts Frosting. Pudding. Cookies. Cakes. Pies. Milk chocolate or white chocolate. Buttered syrups. Full-fat ice cream or ice cream drinks. The items listed above may not be a complete list of foods and drinks to avoid. Contact a dietitian for more information. Summary Heart-healthy meal planning includes eating less unhealthy fats, eating more healthy fats, and making other changes in your diet. Eat a balanced diet. This includes fruits and vegetables, low-fat or nonfat dairy, lean protein, nuts and legumes, whole grains, and  heart-healthy oils and fats. This information is not intended to replace advice given to you by your health care provider. Make sure you discuss any questions you have with your health care provider. Document Revised: 06/27/2020 Document Reviewed: 06/27/2020 Elsevier Patient Education  2022 Elsevier Inc.  

## 2021-02-17 NOTE — Assessment & Plan Note (Signed)
Followed by nephrology Continue Losartan for renal protection

## 2021-02-17 NOTE — Assessment & Plan Note (Signed)
Lipid profile reviewed Encouraged him to consume a low fat diet Consider statin therapy if LDL> 100 or triglycerides > 350

## 2021-02-17 NOTE — Progress Notes (Signed)
HPI  Patient presents the clinic today to establish care and for management of the conditions listed below.  Fabry disease with CKD: His last creatinine was 3.31, GFR 23.  He is on Losartan for renal protection.  He follows with nephrology.  HTN: His BP today is 137/63.  He is taking Losartan and Furosemide as prescribed.  ECG from 10/2020 reviewed.  HLD: His last LDL was unable to be calculated, triglycerides 596.  He is not taking any cholesterol-lowering medication at this time.  He does not consume a low-fat diet.  Chronic Diarrhea: Managed with Lomotil as needed. He would like a refill of this today.  Past Medical History:  Diagnosis Date   CKD (chronic kidney disease), stage II    a. secodnary to Fabry's disease   Fabry disease ([place])    a. 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[place])    Hypertension    LVH (left ventricular hypertrophy)    Mixed hyperlipidemia    Vitamin D deficiency     Current Outpatient Medications  Medication Sig Dispense Refill   aspirin 81 MG tablet Take 81 mg by mouth daily.     cyanocobalamin 1000 MCG tablet Take 100 mcg by mouth daily.     diphenoxylate-atropine (LOMOTIL) 2.5-0.025 MG per tablet Take 2 tablets by mouth 4 (four) times daily as needed.      furosemide (LASIX) 40 MG tablet Take 40 mg by mouth daily.     loratadine (CLARITIN) 10 MG tablet Take 10 mg by mouth.     losartan (COZAAR) 100 MG tablet Take 1 tablet (100 mg total) by mouth daily.     Multiple Vitamin (MULTIVITAMIN) tablet Take 1 tablet by mouth daily.     potassium chloride SA (K-DUR,KLOR-CON) 20 MEQ tablet Take 20 mEq by mouth 2 (two) times daily.     No current facility-administered medications for this visit.   Facility-Administered Medications Ordered in Other Visits  Medication Dose Route Frequency Provider Last Rate Last Admin   acetaminophen (TYLENOL) tablet 1,000 mg  1,000 mg Oral Once Grayland Ormond, Kathlene November, MD        Allergies  Allergen Reactions   Other Other (See Comments)    Nephrologist recommended avoiding contrast.   Penicillins Other (See Comments)    unknown    Family History  Problem Relation Age of Onset   Stroke Mother    CAD Mother    Heart attack Mother    CAD Father    Skin cancer Father     Social History   Socioeconomic History   Marital status: Married    Spouse name: Not on file   Number of children: Not on file   Years of education: Not on file   Highest education level: Not on file  Occupational History   Not on file  Tobacco Use   Smoking status: Never   Smokeless tobacco: Never  Vaping Use   Vaping Use: Never used  Substance and Sexual Activity   Alcohol use: No    Alcohol/week: 0.0 standard drinks   Drug use: No   Sexual activity: Not on file  Other Topics Concern   Not on file  Social History Narrative   Not on file   Social Determinants of Health   Financial Resource Strain: Not on file  Food Insecurity: Not on file  Transportation Needs: Not on file  Physical Activity: Not on file  Stress:  Not on file  Social Connections: Not on file  Intimate Partner Violence: Not on file    ROS:  Constitutional: Denies fever, malaise, fatigue, headache or abrupt weight changes.  HEENT: Denies eye pain, eye redness, ear pain, ringing in the ears, wax buildup, runny nose, nasal congestion, bloody nose, or sore throat. Respiratory: Denies difficulty breathing, shortness of breath, cough or sputum production.   Cardiovascular: Pt reports chronic left leg swelling. Denies chest pain, chest tightness, palpitations or swelling in the hands or feet.  Gastrointestinal: Denies abdominal pain, bloating, constipation, diarrhea or blood in the stool.  GU: Denies frequency, urgency, pain with urination, blood in urine, odor or discharge. Musculoskeletal: Denies decrease in range of motion, difficulty with gait, muscle pain or joint pain and swelling.   Skin: Denies redness, rashes, lesions or ulcercations.  Neurological: Pt reports sharp "electric" pain that starts in left foot and radiates into his lower leg at night. Denies dizziness, difficulty with memory, difficulty with speech or problems with balance and coordination.  Psych: Denies anxiety, depression, SI/HI.  No other specific complaints in a complete review of systems (except as listed in HPI above).  PE:  BP 137/63 (BP Location: Right Arm, Patient Position: Sitting, Cuff Size: Large)    Pulse 63    Temp 97.7 F (36.5 C) (Temporal)    Resp 18    Ht 5\' 9"  (1.753 m)    Wt 193 lb (87.5 kg)    SpO2 98%    BMI 28.50 kg/m  Wt Readings from Last 3 Encounters:  02/17/21 193 lb (87.5 kg)  11/22/20 198 lb 6.4 oz (90 kg)  11/16/18 198 lb 13.7 oz (90.2 kg)    General: Appears his stated age, overweight, in NAD. Skin: Dry and intact. HEENT: Head: normal shape and size; Eyes: sclera white and EOMs intact;  Cardiovascular: Normal rate and rhythm. S1,S2 noted.  No murmur, rubs or gallops noted. Trace non pitting LLE edema. No carotid bruits noted. Pulmonary/Chest: Normal effort and positive vesicular breath sounds. No respiratory distress. No wheezes, rales or ronchi noted.  Musculoskeletal: No difficulty with gait.  Neurological: Alert and oriented. Cranial nerves II-XII grossly intact. Coordination normal.  Psychiatric: Mood and affect normal. Behavior is normal. Judgment and thought content normal.   BMET    Component Value Date/Time   NA 133 (L) 05/21/2015 0827   K 3.1 (L) 05/21/2015 0827   CL 109 05/21/2015 0827   CO2 17 (L) 05/21/2015 0827   GLUCOSE 96 05/21/2015 0827   BUN 16 05/21/2015 0827   CREATININE 1.62 (H) 05/21/2015 0827   CALCIUM 9.0 05/21/2015 0827   GFRNONAA 48 (L) 05/21/2015 0827   GFRAA 55 (L) 05/21/2015 0827    Lipid Panel  No results found for: CHOL, TRIG, HDL, CHOLHDL, VLDL, LDLCALC  CBC    Component Value Date/Time   WBC 7.0 05/21/2015 0827   RBC  5.05 05/21/2015 0827   HGB 14.7 05/21/2015 0827   HCT 43.4 05/21/2015 0827   PLT 370 05/21/2015 0827   MCV 85.9 05/21/2015 0827   MCH 29.0 05/21/2015 0827   MCHC 33.8 05/21/2015 0827   RDW 13.7 05/21/2015 0827    Hgb A1C No results found for: HGBA1C   Assessment and Plan:   Webb Silversmith, NP This visit occurred during the SARS-CoV-2 public health emergency.  Safety protocols were in place, including screening questions prior to the visit, additional usage of staff PPE, and extensive cleaning of exam room while observing appropriate contact time  as indicated for disinfecting solutions.

## 2021-02-18 LAB — LIPID PANEL
Cholesterol: 217 mg/dL — ABNORMAL HIGH (ref <200)
HDL: 43 mg/dL (ref >=40)
Non-HDL Cholesterol (Calc): 174 mg/dL (calc) — ABNORMAL HIGH (ref <130)
Total CHOL/HDL Ratio: 5 (calc) — ABNORMAL HIGH (ref <5.0)
Triglycerides: 521 mg/dL — ABNORMAL HIGH (ref <150)

## 2021-02-20 NOTE — Progress Notes (Signed)
Spoke with pt about a low cholesterol diet and exercise. He is willing to try a cholesterol lowering medication, he will check with his pharmacy to see when it is called in.

## 2021-02-21 MED ORDER — ATORVASTATIN CALCIUM 10 MG PO TABS: 10.0000 mg | ORAL_TABLET | Freq: Every day | ORAL | 0 refills | Status: DC

## 2021-02-21 NOTE — Addendum Note (Signed)
Addended by: Jearld Fenton on: 02/21/2021 10:52 AM   Modules accepted: Orders

## 2021-02-27 DIAGNOSIS — E7521 Fabry (-Anderson) disease: Secondary | ICD-10-CM | POA: Diagnosis not present

## 2021-03-11 DIAGNOSIS — E7521 Fabry (-Anderson) disease: Secondary | ICD-10-CM | POA: Diagnosis not present

## 2021-03-19 DIAGNOSIS — E7521 Fabry (-Anderson) disease: Secondary | ICD-10-CM | POA: Diagnosis not present

## 2021-03-19 DIAGNOSIS — R809 Proteinuria, unspecified: Secondary | ICD-10-CM | POA: Diagnosis not present

## 2021-03-19 DIAGNOSIS — I10 Essential (primary) hypertension: Secondary | ICD-10-CM | POA: Diagnosis not present

## 2021-03-19 DIAGNOSIS — N184 Chronic kidney disease, stage 4 (severe): Secondary | ICD-10-CM | POA: Diagnosis not present

## 2021-03-19 DIAGNOSIS — N2581 Secondary hyperparathyroidism of renal origin: Secondary | ICD-10-CM | POA: Diagnosis not present

## 2021-03-25 DIAGNOSIS — E7521 Fabry (-Anderson) disease: Secondary | ICD-10-CM | POA: Diagnosis not present

## 2021-04-08 DIAGNOSIS — E7521 Fabry (-Anderson) disease: Secondary | ICD-10-CM | POA: Diagnosis not present

## 2021-04-18 DIAGNOSIS — E7521 Fabry (-Anderson) disease: Secondary | ICD-10-CM | POA: Diagnosis not present

## 2021-04-22 DIAGNOSIS — E7521 Fabry (-Anderson) disease: Secondary | ICD-10-CM | POA: Diagnosis not present

## 2021-05-06 DIAGNOSIS — E7521 Fabry (-Anderson) disease: Secondary | ICD-10-CM | POA: Diagnosis not present

## 2021-05-08 DIAGNOSIS — I10 Essential (primary) hypertension: Secondary | ICD-10-CM | POA: Diagnosis not present

## 2021-05-08 DIAGNOSIS — E7521 Fabry (-Anderson) disease: Secondary | ICD-10-CM | POA: Diagnosis not present

## 2021-05-08 DIAGNOSIS — N184 Chronic kidney disease, stage 4 (severe): Secondary | ICD-10-CM | POA: Diagnosis not present

## 2021-05-08 DIAGNOSIS — R809 Proteinuria, unspecified: Secondary | ICD-10-CM | POA: Diagnosis not present

## 2021-05-08 DIAGNOSIS — N2581 Secondary hyperparathyroidism of renal origin: Secondary | ICD-10-CM | POA: Diagnosis not present

## 2021-05-20 DIAGNOSIS — E7521 Fabry (-Anderson) disease: Secondary | ICD-10-CM | POA: Diagnosis not present

## 2021-06-03 DIAGNOSIS — E7521 Fabry (-Anderson) disease: Secondary | ICD-10-CM | POA: Diagnosis not present

## 2021-06-17 DIAGNOSIS — E7521 Fabry (-Anderson) disease: Secondary | ICD-10-CM | POA: Diagnosis not present

## 2021-07-01 DIAGNOSIS — E7521 Fabry (-Anderson) disease: Secondary | ICD-10-CM | POA: Diagnosis not present

## 2021-07-07 DIAGNOSIS — R809 Proteinuria, unspecified: Secondary | ICD-10-CM | POA: Diagnosis not present

## 2021-07-07 DIAGNOSIS — E7521 Fabry (-Anderson) disease: Secondary | ICD-10-CM | POA: Diagnosis not present

## 2021-07-07 DIAGNOSIS — I10 Essential (primary) hypertension: Secondary | ICD-10-CM | POA: Diagnosis not present

## 2021-07-07 DIAGNOSIS — N2581 Secondary hyperparathyroidism of renal origin: Secondary | ICD-10-CM | POA: Diagnosis not present

## 2021-07-07 DIAGNOSIS — N184 Chronic kidney disease, stage 4 (severe): Secondary | ICD-10-CM | POA: Diagnosis not present

## 2021-07-08 DIAGNOSIS — N184 Chronic kidney disease, stage 4 (severe): Secondary | ICD-10-CM | POA: Diagnosis not present

## 2021-07-10 DIAGNOSIS — R0683 Snoring: Secondary | ICD-10-CM | POA: Diagnosis not present

## 2021-07-10 DIAGNOSIS — R42 Dizziness and giddiness: Secondary | ICD-10-CM | POA: Diagnosis not present

## 2021-07-15 DIAGNOSIS — E7521 Fabry (-Anderson) disease: Secondary | ICD-10-CM | POA: Diagnosis not present

## 2021-07-22 ENCOUNTER — Encounter (INDEPENDENT_AMBULATORY_CARE_PROVIDER_SITE_OTHER): Payer: Self-pay | Admitting: Nurse Practitioner

## 2021-07-22 DIAGNOSIS — Z7682 Awaiting organ transplant status: Secondary | ICD-10-CM | POA: Diagnosis not present

## 2021-07-22 DIAGNOSIS — E7521 Fabry (-Anderson) disease: Secondary | ICD-10-CM | POA: Diagnosis not present

## 2021-07-29 DIAGNOSIS — E7521 Fabry (-Anderson) disease: Secondary | ICD-10-CM | POA: Diagnosis not present

## 2021-07-30 ENCOUNTER — Encounter (INDEPENDENT_AMBULATORY_CARE_PROVIDER_SITE_OTHER): Payer: Self-pay | Admitting: Nurse Practitioner

## 2021-08-12 DIAGNOSIS — E7521 Fabry (-Anderson) disease: Secondary | ICD-10-CM | POA: Diagnosis not present

## 2021-08-26 DIAGNOSIS — E7521 Fabry (-Anderson) disease: Secondary | ICD-10-CM | POA: Diagnosis not present

## 2021-09-09 DIAGNOSIS — E7521 Fabry (-Anderson) disease: Secondary | ICD-10-CM | POA: Diagnosis not present

## 2021-09-23 DIAGNOSIS — E7521 Fabry (-Anderson) disease: Secondary | ICD-10-CM | POA: Diagnosis not present

## 2021-10-07 DIAGNOSIS — E7521 Fabry (-Anderson) disease: Secondary | ICD-10-CM | POA: Diagnosis not present

## 2021-10-20 DIAGNOSIS — E7521 Fabry (-Anderson) disease: Secondary | ICD-10-CM | POA: Diagnosis not present

## 2021-10-21 DIAGNOSIS — E7521 Fabry (-Anderson) disease: Secondary | ICD-10-CM | POA: Diagnosis not present

## 2021-10-22 DIAGNOSIS — N2581 Secondary hyperparathyroidism of renal origin: Secondary | ICD-10-CM | POA: Diagnosis not present

## 2021-10-22 DIAGNOSIS — N184 Chronic kidney disease, stage 4 (severe): Secondary | ICD-10-CM | POA: Diagnosis not present

## 2021-10-22 DIAGNOSIS — R809 Proteinuria, unspecified: Secondary | ICD-10-CM | POA: Diagnosis not present

## 2021-10-22 DIAGNOSIS — I10 Essential (primary) hypertension: Secondary | ICD-10-CM | POA: Diagnosis not present

## 2021-10-22 DIAGNOSIS — E7521 Fabry (-Anderson) disease: Secondary | ICD-10-CM | POA: Diagnosis not present

## 2021-10-27 DIAGNOSIS — Z23 Encounter for immunization: Secondary | ICD-10-CM | POA: Diagnosis not present

## 2021-11-04 ENCOUNTER — Telehealth: Payer: Self-pay

## 2021-11-04 DIAGNOSIS — E7521 Fabry (-Anderson) disease: Secondary | ICD-10-CM | POA: Diagnosis not present

## 2021-11-04 NOTE — Patient Outreach (Signed)
  Care Coordination   11/04/2021 Name: Jeffery Hughes MRN: [id number] DOB: Mar 25, 1965   Care Coordination Outreach Attempts:  An unsuccessful telephone outreach was attempted today to offer the patient information about available care coordination services as a benefit of their health plan.   Follow Up Plan:  Additional outreach attempts will be made to offer the patient care coordination information and services.   Encounter Outcome:  No Answer  Care Coordination Interventions Activated:  No   Care Coordination Interventions:  No, not indicated    Noreene Larsson RN, MSN, CCM Community Care Coordinator [facility] Mobile: [phone]

## 2021-11-12 ENCOUNTER — Telehealth: Payer: Self-pay

## 2021-11-12 NOTE — Chronic Care Management (AMB) (Signed)
  Care Coordination  Outreach Note  11/12/2021 Name: STANLEY LYNESS MRN: [id number] DOB: 06-10-1965   Care Coordination Outreach Attempts: A second unsuccessful outreach was attempted today to offer the patient with information about available care coordination services as a benefit of their health plan.     Follow Up Plan:  Additional outreach attempts will be made to offer the patient care coordination information and services.   Encounter Outcome:  No Answer  Sig Noreene Larsson, [place], [place] [address] Direct Dial: [phone] [email or web address]

## 2021-11-18 DIAGNOSIS — E7521 Fabry (-Anderson) disease: Secondary | ICD-10-CM | POA: Diagnosis not present

## 2021-11-24 DIAGNOSIS — Z23 Encounter for immunization: Secondary | ICD-10-CM | POA: Diagnosis not present

## 2021-11-24 NOTE — Chronic Care Management (AMB) (Signed)
  Care Coordination  Outreach Note  11/24/2021 Name: Jeffery Hughes MRN: [id number] DOB: 12/01/1965   Care Coordination Outreach Attempts: A third unsuccessful outreach was attempted today to offer the patient with information about available care coordination services as a benefit of their health plan.   Follow Up Plan:  No further outreach attempts will be made at this time. We have been unable to contact the patient to offer or enroll patient in care coordination services  Encounter Outcome:  No Answer  [place], [place], [place] [address] Direct Dial: [phone] [email or web address]

## 2021-12-01 DIAGNOSIS — N2581 Secondary hyperparathyroidism of renal origin: Secondary | ICD-10-CM | POA: Diagnosis not present

## 2021-12-01 DIAGNOSIS — I10 Essential (primary) hypertension: Secondary | ICD-10-CM | POA: Diagnosis not present

## 2021-12-01 DIAGNOSIS — E7521 Fabry (-Anderson) disease: Secondary | ICD-10-CM | POA: Diagnosis not present

## 2021-12-01 DIAGNOSIS — R809 Proteinuria, unspecified: Secondary | ICD-10-CM | POA: Diagnosis not present

## 2021-12-01 DIAGNOSIS — N184 Chronic kidney disease, stage 4 (severe): Secondary | ICD-10-CM | POA: Diagnosis not present

## 2021-12-16 DIAGNOSIS — E7521 Fabry (-Anderson) disease: Secondary | ICD-10-CM | POA: Diagnosis not present

## 2021-12-19 ENCOUNTER — Emergency Department: Payer: No Typology Code available for payment source

## 2021-12-19 ENCOUNTER — Other Ambulatory Visit: Payer: Self-pay

## 2021-12-19 ENCOUNTER — Encounter: Payer: Self-pay | Admitting: Oncology

## 2021-12-19 ENCOUNTER — Emergency Department
Admission: EM | Admit: 2021-12-19 | Discharge: 2021-12-19 | Disposition: A | Discharge status: Home / Self Care | Payer: No Typology Code available for payment source | Attending: Emergency Medicine | Admitting: Emergency Medicine

## 2021-12-19 DIAGNOSIS — Y92410 Unspecified street and highway as the place of occurrence of the external cause: Secondary | ICD-10-CM | POA: Insufficient documentation

## 2021-12-19 DIAGNOSIS — S161XXA Strain of muscle, fascia and tendon at neck level, initial encounter: Secondary | ICD-10-CM | POA: Diagnosis not present

## 2021-12-19 DIAGNOSIS — G238 Other specified degenerative diseases of basal ganglia: Secondary | ICD-10-CM | POA: Diagnosis not present

## 2021-12-19 DIAGNOSIS — M47812 Spondylosis without myelopathy or radiculopathy, cervical region: Secondary | ICD-10-CM | POA: Diagnosis not present

## 2021-12-19 DIAGNOSIS — M545 Low back pain, unspecified: Secondary | ICD-10-CM

## 2021-12-19 DIAGNOSIS — S199XXA Unspecified injury of neck, initial encounter: Secondary | ICD-10-CM | POA: Diagnosis present

## 2021-12-19 DIAGNOSIS — M4322 Fusion of spine, cervical region: Secondary | ICD-10-CM | POA: Diagnosis not present

## 2021-12-19 DIAGNOSIS — Z041 Encounter for examination and observation following transport accident: Secondary | ICD-10-CM | POA: Diagnosis not present

## 2021-12-19 MED ORDER — LIDOCAINE 5 % EX PTCH: 1.0000 | MEDICATED_PATCH | Freq: Two times a day (BID) | CUTANEOUS | 0 refills | Status: AC
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
## 2021-12-19 NOTE — Discharge Instructions (Signed)
Your CT scans were normal.  You may continue to take Tylenol/ibuprofen per package instructions to help with your symptoms.  You may also use the Lidoderm patches as prescribed.  Please return for any new, worsening, or change in symptoms or other concerns including numbness, tingling, weakness in your extremities, vomiting, vision changes, chest pain, abdominal pain, or any other concerns.  It is a pleasure caring for you today.

## 2021-12-29 ENCOUNTER — Encounter (INDEPENDENT_AMBULATORY_CARE_PROVIDER_SITE_OTHER): Payer: Self-pay

## 2021-12-30 DIAGNOSIS — E7521 Fabry (-Anderson) disease: Secondary | ICD-10-CM | POA: Diagnosis not present

## 2022-01-07 ENCOUNTER — Encounter: Payer: Self-pay | Admitting: Oncology

## 2022-01-09 ENCOUNTER — Encounter: Payer: Self-pay | Admitting: Internal Medicine

## 2022-01-09 ENCOUNTER — Ambulatory Visit (INDEPENDENT_AMBULATORY_CARE_PROVIDER_SITE_OTHER): Payer: Medicare Other | Admitting: Internal Medicine

## 2022-01-09 VITALS — BP 128/82 | HR 68 | Temp 96.9°F | Wt 196.0 lb

## 2022-01-09 DIAGNOSIS — M79672 Pain in left foot: Secondary | ICD-10-CM

## 2022-01-09 DIAGNOSIS — M542 Cervicalgia: Secondary | ICD-10-CM

## 2022-01-09 DIAGNOSIS — M549 Dorsalgia, unspecified: Secondary | ICD-10-CM

## 2022-01-09 DIAGNOSIS — M545 Low back pain, unspecified: Secondary | ICD-10-CM

## 2022-01-09 MED ORDER — METHOCARBAMOL 500 MG PO TABS: 500.0000 mg | ORAL_TABLET | Freq: Three times a day (TID) | ORAL | 0 refills | Status: DC | PRN

## 2022-01-09 MED ORDER — TRAMADOL HCL 50 MG PO TABS: 50.0000 mg | ORAL_TABLET | Freq: Three times a day (TID) | ORAL | 0 refills | Status: AC | PRN

## 2022-01-09 NOTE — Patient Instructions (Signed)
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
## 2022-01-13 DIAGNOSIS — E7521 Fabry (-Anderson) disease: Secondary | ICD-10-CM | POA: Diagnosis not present

## 2022-02-09 ENCOUNTER — Encounter: Payer: Self-pay | Admitting: Internal Medicine

## 2022-02-09 ENCOUNTER — Ambulatory Visit (INDEPENDENT_AMBULATORY_CARE_PROVIDER_SITE_OTHER): Payer: Medicare Other | Admitting: Internal Medicine

## 2022-02-09 ENCOUNTER — Ambulatory Visit (INDEPENDENT_AMBULATORY_CARE_PROVIDER_SITE_OTHER): Payer: Medicare Other

## 2022-02-09 VITALS — BP 139/79 | HR 63 | Temp 98.6°F | Resp 18 | Ht 69.0 in | Wt 192.0 lb

## 2022-02-09 VITALS — BP 139/79 | HR 60 | Temp 98.6°F | Resp 17 | Ht 69.0 in | Wt 192.0 lb

## 2022-02-09 DIAGNOSIS — Z0000 Encounter for general adult medical examination without abnormal findings: Secondary | ICD-10-CM | POA: Diagnosis not present

## 2022-02-09 DIAGNOSIS — E663 Overweight: Secondary | ICD-10-CM

## 2022-02-09 DIAGNOSIS — Z1211 Encounter for screening for malignant neoplasm of colon: Secondary | ICD-10-CM

## 2022-02-09 DIAGNOSIS — I10 Essential (primary) hypertension: Secondary | ICD-10-CM | POA: Diagnosis not present

## 2022-02-09 DIAGNOSIS — E781 Pure hyperglyceridemia: Secondary | ICD-10-CM | POA: Diagnosis not present

## 2022-02-09 DIAGNOSIS — E7521 Fabry (-Anderson) disease: Secondary | ICD-10-CM

## 2022-02-09 DIAGNOSIS — N184 Chronic kidney disease, stage 4 (severe): Secondary | ICD-10-CM

## 2022-02-09 DIAGNOSIS — R7309 Other abnormal glucose: Secondary | ICD-10-CM

## 2022-02-09 DIAGNOSIS — Z6828 Body mass index (BMI) 28.0-28.9, adult: Secondary | ICD-10-CM | POA: Diagnosis not present

## 2022-02-09 NOTE — Assessment & Plan Note (Signed)
Controlled on losartan and furosemide Reinforced DASH diet and exercise for weight loss [facility] today

## 2022-02-09 NOTE — Assessment & Plan Note (Signed)
Follows with nephrology [facility] today Continue losartan for renal protection

## 2022-02-09 NOTE — Progress Notes (Signed)
Subjective:    Patient ID: Jeffery Hughes, male    DOB: 11/01/1965, 56 y.o.   MRN: [id number]  HPI  Patient presents to clinic today for follow-up of chronic conditions.  Fabry Disease with CKD: His last creatinine was 3.8, GFR 18, 09/2021.  He is on Losartan for renal protection.  He follows with nephrology.  HTN: His BP today is 139/79.  He is taking Losartan and Furosemide as prescribed.  ECG from 10/2020 reviewed.  HLD: His last LDL was not calculated, triglycerides 552, 09/2021.  He is not taking Atorvastatin as prescribed.  He does not consume a low-fat diet.  Diarrhea: Chronic, managed with Lomotil as needed.  There is no colonoscopy on file.  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)    Hypertension    LVH (left ventricular hypertrophy)    Mixed hyperlipidemia    Vitamin D deficiency     Current Outpatient Medications  Medication Sig Dispense Refill   aspirin 81 MG tablet Take 81 mg by mouth daily.     atorvastatin (LIPITOR) 10 MG tablet Take 1 tablet (10 mg total) by mouth daily. 90 tablet 0   calcitRIOL (ROCALTROL) 0.25 MCG capsule Take by mouth.     cyanocobalamin 1000 MCG tablet Take 100 mcg by mouth daily.     diphenoxylate-atropine (LOMOTIL) 2.5-0.025 MG tablet Take 2 tablets by mouth 4 (four) times daily as needed. 30 tablet 1   furosemide (LASIX) 40 MG tablet Take 40 mg by mouth daily.     loratadine (CLARITIN) 10 MG tablet Take 10 mg by mouth.     losartan (COZAAR) 100 MG tablet Take 1 tablet (100 mg total) by mouth daily.     methocarbamol (ROBAXIN) 500 MG tablet Take 1 tablet (500 mg total) by mouth every 8 (eight) hours as needed for muscle spasms. 30 tablet 0   Multiple Vitamin (MULTIVITAMIN) tablet Take 1 tablet by mouth  daily.     potassium chloride SA (KLOR-CON M) 20 MEQ tablet Take 1 tablet (20 mEq total) by mouth 2 (two) times daily. 180 tablet 1   No current facility-administered medications for this visit.   Facility-Administered Medications Ordered in Other Visits  Medication Dose Route Frequency Provider Last Rate Last Admin   acetaminophen (TYLENOL) tablet 1,000 mg  1,000 mg Oral Once Grayland Ormond, Kathlene November, MD        Allergies  Allergen Reactions   Other Other (See Comments)    Nephrologist recommended avoiding contrast.   Penicillins Other (See Comments)    unknown    Family History  Problem Relation Age of Onset   Stroke Mother    CAD Mother    Heart attack Mother    CAD Father    Skin cancer Father     Social History   Socioeconomic History   Marital status: Married    Spouse name: Not on file   Number of children: Not on file   Years of education: Not on file   Highest education level: Not on file  Occupational History   Not on file  Tobacco Use   Smoking status: Never   Smokeless tobacco: Never  Vaping Use   Vaping Use: Never  used  Substance and Sexual Activity   Alcohol use: No    Alcohol/week: 0.0 standard drinks of alcohol   Drug use: No   Sexual activity: Not on file  Other Topics Concern   Not on file  Social History Narrative   Not on file   Social Determinants of Health   Financial Resource Strain: Not on file  Food Insecurity: Not on file  Transportation Needs: Not on file  Physical Activity: Not on file  Stress: Not on file  Social Connections: Not on file  Intimate Partner Violence: Not on file     Constitutional: Denies fever, malaise, fatigue, headache or abrupt weight changes.  HEENT: Denies eye pain, eye redness, ear pain, ringing in the ears, wax buildup, runny nose, nasal congestion, bloody nose, or sore throat. Respiratory: Denies difficulty breathing, shortness of breath, cough or sputum production.   Cardiovascular: Denies chest pain,  chest tightness, palpitations or swelling in the hands or feet.  Gastrointestinal: Patient reports diarrhea.  Denies abdominal pain, bloating, constipation, or blood in the stool.  GU: Denies urgency, frequency, pain with urination, burning sensation, blood in urine, odor or discharge. Musculoskeletal: Denies decrease in range of motion, difficulty with gait, muscle pain or joint pain and swelling.  Skin: Denies redness, rashes, lesions or ulcercations.  Neurological: Denies dizziness, difficulty with memory, difficulty with speech or problems with balance and coordination.  Psych: Denies anxiety, depression, SI/HI.  No other specific complaints in a complete review of systems (except as listed in HPI above).  Objective:   Physical Exam  BP 139/79 (BP Location: Left Arm, Patient Position: Sitting, Cuff Size: Normal)   Pulse 63   Temp 98.6 F (37 C) (Oral)   Resp 18   Ht 5\' 9"  (1.753 m)   Wt 192 lb (87.1 kg)   SpO2 99%   BMI 28.35 kg/m   Wt Readings from Last 3 Encounters:  01/09/22 196 lb (88.9 kg)  12/19/21 188 lb (85.3 kg)  02/17/21 193 lb (87.5 kg)    General: Appears his stated age, overweight, in NAD. Skin: Warm, dry and intact.  HEENT: Head: normal shape and size; Eyes: sclera white, no icterus, conjunctiva pink, PERRLA and EOMs intact;  Cardiovascular: Normal rate and rhythm. S1,S2 noted.  No murmur, rubs or gallops noted. No JVD. 1+ pitting LLE edema. No carotid bruits noted. Pulmonary/Chest: Normal effort and positive vesicular breath sounds. No respiratory distress. No wheezes, rales or ronchi noted.  Musculoskeletal: No difficulty with gait.  Neurological: Alert and oriented.  Coordination normal.     BMET    Component Value Date/Time   NA 133 (L) 05/21/2015 0827   K 3.1 (L) 05/21/2015 0827   CL 109 05/21/2015 0827   CO2 17 (L) 05/21/2015 0827   GLUCOSE 96 05/21/2015 0827   BUN 16 05/21/2015 0827   CREATININE 1.62 (H) 05/21/2015 0827   CALCIUM 9.0  05/21/2015 0827   GFRNONAA 48 (L) 05/21/2015 0827   GFRAA 55 (L) 05/21/2015 0827    Lipid Panel     Component Value Date/Time   CHOL 217 (H) 02/17/2021 1107   TRIG 521 (H) 02/17/2021 1107   HDL 43 02/17/2021 1107   CHOLHDL 5.0 (H) 02/17/2021 1107   [place]  02/17/2021 1107     Comment:     . LDL cholesterol not calculated. Triglyceride levels greater than 400 mg/dL invalidate calculated LDL results. . Reference range: <100 . Desirable range <100 mg/dL for primary prevention;   <70 mg/dL for  patients with CHD or diabetic patients  with > or = 2 CHD risk factors. Marland Kitchen LDL-C is now calculated using the Martin-Hopkins  calculation, which is a validated novel method providing  better accuracy than the Friedewald equation in the  estimation of LDL-C.  Cresenciano Genre et al. Annamaria Helling. 5784;696(29): 2061-2068  (http://education.[facility].com/faq/FAQ164)     CBC    Component Value Date/Time   WBC 7.0 05/21/2015 0827   RBC 5.05 05/21/2015 0827   HGB 14.7 05/21/2015 0827   HCT 43.4 05/21/2015 0827   PLT 370 05/21/2015 0827   MCV 85.9 05/21/2015 0827   MCH 29.0 05/21/2015 0827   MCHC 33.8 05/21/2015 0827   RDW 13.7 05/21/2015 0827    Hgb A1C No results found for: "HGBA1C"         Assessment & Plan:     RTC in 6 months for follow-up of chronic conditions Webb Silversmith, NP

## 2022-02-09 NOTE — Assessment & Plan Note (Signed)
Encourage diet and exercise for weight loss 

## 2022-02-09 NOTE — Patient Instructions (Signed)
Health Maintenance, Male Adopting a healthy lifestyle and getting preventive care are important in promoting health and wellness. Ask your health care provider about: The right schedule for you to have regular tests and exams. Things you can do on your own to prevent diseases and keep yourself healthy. What should I know about diet, weight, and exercise? Eat a healthy diet  Eat a diet that includes plenty of vegetables, fruits, low-fat dairy products, and lean protein. Do not eat a lot of foods that are high in solid fats, added sugars, or sodium. Maintain a healthy weight Body mass index (BMI) is a measurement that can be used to identify possible weight problems. It estimates body fat based on height and weight. Your health care provider can help determine your BMI and help you achieve or maintain a healthy weight. Get regular exercise Get regular exercise. This is one of the most important things you can do for your health. Most adults should: Exercise for at least 150 minutes each week. The exercise should increase your heart rate and make you sweat (moderate-intensity exercise). Do strengthening exercises at least twice a week. This is in addition to the moderate-intensity exercise. Spend less time sitting. Even light physical activity can be beneficial. Watch cholesterol and blood lipids Have your blood tested for lipids and cholesterol at 58 years of age, then have this test every 5 years. You may need to have your cholesterol levels checked more often if: Your lipid or cholesterol levels are high. You are older than 58 years of age. You are at high risk for heart disease. What should I know about cancer screening? Many types of cancers can be detected early and may often be prevented. Depending on your health history and family history, you may need to have cancer screening at various ages. This may include screening for: Colorectal cancer. Prostate cancer. Skin cancer. Lung  cancer. What should I know about heart disease, diabetes, and high blood pressure? Blood pressure and heart disease High blood pressure causes heart disease and increases the risk of stroke. This is more likely to develop in people who have high blood pressure readings or are overweight. Talk with your health care provider about your target blood pressure readings. Have your blood pressure checked: Every 3-5 years if you are 18-39 years of age. Every year if you are 40 years old or older. If you are between the ages of 65 and 75 and are a current or former smoker, ask your health care provider if you should have a one-time screening for abdominal aortic aneurysm (AAA). Diabetes Have regular diabetes screenings. This checks your fasting blood sugar level. Have the screening done: Once every three years after age 45 if you are at a normal weight and have a low risk for diabetes. More often and at a younger age if you are overweight or have a high risk for diabetes. What should I know about preventing infection? Hepatitis B If you have a higher risk for hepatitis B, you should be screened for this virus. Talk with your health care provider to find out if you are at risk for hepatitis B infection. Hepatitis C Blood testing is recommended for: Everyone born from 1945 through 1965. Anyone with known risk factors for hepatitis C. Sexually transmitted infections (STIs) You should be screened each year for STIs, including gonorrhea and chlamydia, if: You are sexually active and are younger than 58 years of age. You are older than 58 years of age and your   health care provider tells you that you are at risk for this type of infection. Your sexual activity has changed since you were last screened, and you are at increased risk for chlamydia or gonorrhea. Ask your health care provider if you are at risk. Ask your health care provider about whether you are at high risk for HIV. Your health care provider  may recommend a prescription medicine to help prevent HIV infection. If you choose to take medicine to prevent HIV, you should first get tested for HIV. You should then be tested every 3 months for as long as you are taking the medicine. Follow these instructions at home: Alcohol use Do not drink alcohol if your health care provider tells you not to drink. If you drink alcohol: Limit how much you have to 0-2 drinks a day. Know how much alcohol is in your drink. In the [place] one drink equals one 12 oz bottle of beer (355 mL), one 5 oz glass of wine (148 mL), or one 1 oz glass of hard liquor (44 mL). Lifestyle Do not use any products that contain nicotine or tobacco. These products include cigarettes, chewing tobacco, and vaping devices, such as e-cigarettes. If you need help quitting, ask your health care provider. Do not use street drugs. Do not share needles. Ask your health care provider for help if you need support or information about quitting drugs. General instructions Schedule regular health, dental, and eye exams. Stay current with your vaccines. Tell your health care provider if: You often feel depressed. You have ever been abused or do not feel safe at home. Summary Adopting a healthy lifestyle and getting preventive care are important in promoting health and wellness. Follow your health care provider's instructions about healthy diet, exercising, and getting tested or screened for diseases. Follow your health care provider's instructions on monitoring your cholesterol and blood pressure. This information is not intended to replace advice given to you by your health care provider. Make sure you discuss any questions you have with your health care provider. Document Revised: 07/08/2020 Document Reviewed: 07/08/2020 Elsevier Patient Education  2023 [employer]  

## 2022-02-09 NOTE — Patient Instructions (Signed)
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
## 2022-02-09 NOTE — Assessment & Plan Note (Signed)
C-Met and lipid profile today Encouraged him to consume a low-fat diet

## 2022-02-10 LAB — COMPLETE METABOLIC PANEL WITH GFR
AG Ratio: 1.5 (calc) (ref 1.0–2.5)
ALT: 17 U/L (ref 9–46)
AST: 17 U/L (ref 10–35)
Albumin: 4.3 g/dL (ref 3.6–5.1)
Alkaline phosphatase (APISO): 64 U/L (ref 35–144)
BUN/Creatinine Ratio: 11 (calc) (ref 6–22)
BUN: 45 mg/dL — ABNORMAL HIGH (ref 7–25)
CO2: 22 mmol/L (ref 20–32)
Calcium: 9.4 mg/dL (ref 8.6–10.3)
Chloride: 107 mmol/L (ref 98–110)
Creat: 4.17 mg/dL — ABNORMAL HIGH (ref 0.70–1.30)
Globulin: 2.9 g/dL (calc) (ref 1.9–3.7)
Glucose, Bld: 83 mg/dL (ref 65–99)
Potassium: 4.8 mmol/L (ref 3.5–5.3)
Sodium: 141 mmol/L (ref 135–146)
Total Bilirubin: 0.4 mg/dL (ref 0.2–1.2)
Total Protein: 7.2 g/dL (ref 6.1–8.1)
eGFR: 16 mL/min/{1.73_m2} — ABNORMAL LOW (ref >=60)

## 2022-02-10 LAB — HEMOGLOBIN A1C
Hgb A1c MFr Bld: 5.4 % of total Hgb (ref <5.7)
Mean Plasma Glucose: 108 mg/dL
eAG (mmol/L): 6 mmol/L

## 2022-02-10 LAB — LIPID PANEL
Cholesterol: 226 mg/dL — ABNORMAL HIGH (ref <200)
HDL: 47 mg/dL (ref >=40)
Non-HDL Cholesterol (Calc): 179 mg/dL (calc) — ABNORMAL HIGH (ref <130)
Total CHOL/HDL Ratio: 4.8 (calc) (ref <5.0)
Triglycerides: 443 mg/dL — ABNORMAL HIGH (ref <150)

## 2022-02-18 DIAGNOSIS — N2581 Secondary hyperparathyroidism of renal origin: Secondary | ICD-10-CM | POA: Diagnosis not present

## 2022-02-18 DIAGNOSIS — R809 Proteinuria, unspecified: Secondary | ICD-10-CM | POA: Diagnosis not present

## 2022-02-18 DIAGNOSIS — I10 Essential (primary) hypertension: Secondary | ICD-10-CM | POA: Diagnosis not present

## 2022-02-18 DIAGNOSIS — E7521 Fabry (-Anderson) disease: Secondary | ICD-10-CM | POA: Diagnosis not present

## 2022-02-18 DIAGNOSIS — N184 Chronic kidney disease, stage 4 (severe): Secondary | ICD-10-CM | POA: Diagnosis not present

## 2022-02-20 ENCOUNTER — Other Ambulatory Visit: Payer: Self-pay

## 2022-02-20 ENCOUNTER — Emergency Department: Payer: Medicare Other

## 2022-02-20 ENCOUNTER — Emergency Department
Admission: EM | Admit: 2022-02-20 | Discharge: 2022-02-20 | Disposition: A | Discharge status: Home / Self Care | Payer: Medicare Other | Attending: Emergency Medicine | Admitting: Emergency Medicine

## 2022-02-20 DIAGNOSIS — M791 Myalgia, unspecified site: Secondary | ICD-10-CM | POA: Insufficient documentation

## 2022-02-20 DIAGNOSIS — S161XXA Strain of muscle, fascia and tendon at neck level, initial encounter: Secondary | ICD-10-CM | POA: Insufficient documentation

## 2022-02-20 DIAGNOSIS — I129 Hypertensive chronic kidney disease with stage 1 through stage 4 chronic kidney disease, or unspecified chronic kidney disease: Secondary | ICD-10-CM | POA: Insufficient documentation

## 2022-02-20 DIAGNOSIS — M545 Low back pain, unspecified: Secondary | ICD-10-CM | POA: Diagnosis not present

## 2022-02-20 DIAGNOSIS — S199XXA Unspecified injury of neck, initial encounter: Secondary | ICD-10-CM | POA: Diagnosis present

## 2022-02-20 DIAGNOSIS — M7918 Myalgia, other site: Secondary | ICD-10-CM

## 2022-02-20 DIAGNOSIS — Y92410 Unspecified street and highway as the place of occurrence of the external cause: Secondary | ICD-10-CM | POA: Diagnosis not present

## 2022-02-20 DIAGNOSIS — Z041 Encounter for examination and observation following transport accident: Secondary | ICD-10-CM | POA: Diagnosis not present

## 2022-02-20 DIAGNOSIS — N189 Chronic kidney disease, unspecified: Secondary | ICD-10-CM | POA: Insufficient documentation

## 2022-02-20 MED ORDER — PREDNISONE 10 MG (21) PO TBPK: ORAL_TABLET | ORAL | 0 refills | Status: DC

## 2022-02-20 MED ORDER — BACLOFEN 10 MG PO TABS: 5.0000 mg | ORAL_TABLET | Freq: Three times a day (TID) | ORAL | 0 refills | Status: AC

## 2022-02-20 NOTE — Discharge Instructions (Signed)
Follow-up with orthopedics if not improved in 3 to 4 days.  Take the medication as prescribed.
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
## 2022-04-03 ENCOUNTER — Ambulatory Visit (INDEPENDENT_AMBULATORY_CARE_PROVIDER_SITE_OTHER): Payer: Medicare Other | Admitting: Internal Medicine

## 2022-04-03 ENCOUNTER — Encounter: Payer: Self-pay | Admitting: Internal Medicine

## 2022-04-03 VITALS — BP 134/82 | HR 69 | Temp 96.8°F | Wt 195.0 lb

## 2022-04-03 DIAGNOSIS — J069 Acute upper respiratory infection, unspecified: Secondary | ICD-10-CM

## 2022-04-03 DIAGNOSIS — R0981 Nasal congestion: Secondary | ICD-10-CM

## 2022-04-03 DIAGNOSIS — R059 Cough, unspecified: Secondary | ICD-10-CM

## 2022-04-03 LAB — POCT INFLUENZA A/B
Influenza A, POC: NEGATIVE
Influenza B, POC: NEGATIVE

## 2022-04-03 LAB — POC COVID19 BINAXNOW: SARS Coronavirus 2 Ag: NEGATIVE

## 2022-04-03 MED ORDER — PROMETHAZINE-DM 6.25-15 MG/5ML PO SYRP: 5.0000 mL | ORAL_SOLUTION | Freq: Four times a day (QID) | ORAL | 0 refills | Status: DC | PRN

## 2022-04-03 MED ORDER — IPRATROPIUM BROMIDE 0.06 % NA SOLN: 2.0000 | Freq: Four times a day (QID) | NASAL | 0 refills | Status: DC

## 2022-04-03 NOTE — Patient Instructions (Signed)

## 2022-04-03 NOTE — Progress Notes (Signed)
Subjective:    Patient ID: Jeffery Hughes, male    DOB: 1965/08/19, 57 y.o.   MRN: [id number]  HPI  Patient presents to clinic today with c/o runny, nasal congestion and cough. This started 3-4 days ago. He is blowing clear mucous out of his nose. The cough is productive of mucous but he is unsure of the color.  He denies headache, ear pain, sore throat, shortness of breath, nausea, vomiting or diarrhea.  He denies fever, chills or body aches.  He has tried Mucinex and Claritin OTC with some relief of symptoms.  He has not had sick contacts that he is aware of.  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     atorvastatin (LIPITOR) 10 MG tablet Take 1 tablet (10 mg total) by mouth daily. (Patient not taking: Reported on 02/09/2022) 90 tablet 0   calcitRIOL (ROCALTROL) 0.25 MCG capsule Take by mouth.     cyanocobalamin 1000 MCG tablet Take 100 mcg by mouth daily.     diphenoxylate-atropine (LOMOTIL) 2.5-0.025 MG tablet Take 2 tablets by mouth 4 (four) times daily as needed. (Patient not taking: Reported on 02/09/2022) 30 tablet 1   furosemide (LASIX) 40 MG tablet Take 40 mg by mouth daily.     loratadine (CLARITIN) 10 MG tablet Take 10 mg by mouth.     losartan (COZAAR) 100 MG tablet Take 1 tablet (100 mg total) by mouth daily.     Multiple Vitamin (MULTIVITAMIN) tablet Take 1 tablet by mouth daily.     potassium chloride SA (KLOR-CON M) 20 MEQ tablet Take 1 tablet (20 mEq total) by mouth 2 (two) times daily. 180 tablet 1   predniSONE  (STERAPRED UNI-PAK 21 TAB) 10 MG (21) TBPK tablet Take 6 pills on day one then decrease by 1 pill each day 21 tablet 0   No current facility-administered medications for this visit.   Facility-Administered Medications Ordered in Other Visits  Medication Dose Route Frequency Provider Last Rate Last Admin   acetaminophen (TYLENOL) tablet 1,000 mg  1,000 mg Oral Once Grayland Ormond, Kathlene November, MD        Allergies  Allergen Reactions   Other Other (See Comments)    Nephrologist recommended avoiding contrast.   Penicillins Other (See Comments)    unknown    Family History  Problem Relation Age of Onset   Stroke Mother    CAD Mother    Heart attack Mother    CAD Father    Skin cancer Father     Social History   Socioeconomic History   Marital status: Married    Spouse name: Gerrit Friends   Number of children: Not on file   Years of education: Not on file   Highest education level: Not on file  Occupational History   Occupation: Disabled  Tobacco Use   Smoking status: Never   Smokeless tobacco: Never  Vaping Use   Vaping Use: Never used  Substance and Sexual Activity   Alcohol use: No    Alcohol/week: 0.0 standard drinks of alcohol   Drug use: No   Sexual activity: Not on file  Other Topics Concern   Not on file  Social History Narrative   Not on file   Social Determinants of Health   Financial Resource Strain: Low Risk  (02/09/2022)   Overall Financial Resource Strain (CARDIA)    Difficulty of Paying Living Expenses: Not hard at all  Food Insecurity: No Food Insecurity (02/09/2022)   Hunger Vital Sign    Worried About Running Out of Food in the Last Year: Never true    Ran Out of Food in the Last Year: Never true  Transportation Needs: No Transportation Needs (02/09/2022)   PRAPARE - Hydrologist (Medical): No    Lack of Transportation (Non-Medical): No  Physical Activity: Insufficiently Active (02/09/2022)   Exercise Vital Sign    Days  of Exercise per Week: 3 days    Minutes of Exercise per Session: 30 min  Stress: No Stress Concern Present (02/09/2022)   [place]    Feeling of Stress : Not at all  Social Connections: [place] (02/09/2022)   Social Connection and Isolation Panel [NHANES]    Frequency of Communication with Friends and Family: More than three times a week    Frequency of Social Gatherings with Friends and Family: Once a week    Attends Religious Services: More than 4 times per year    Active Member of [employer] or Organizations: Yes    Attends Music therapist: More than 4 times per year    Marital Status: Married  Human resources officer Violence: Not At Risk (02/09/2022)   Humiliation, Afraid, Rape, and Kick questionnaire    Fear of Current or Ex-Partner: No    Emotionally Abused: No    Physically Abused: No    Sexually Abused: No     Constitutional: Denies fever, malaise, fatigue, headache or abrupt weight changes.  HEENT: Patient reports runny nose, nasal congestion.  Denies eye pain, eye redness, ear pain, ringing in the ears, wax buildup, bloody nose, or sore throat. Respiratory: Patient reports cough.  Denies difficulty breathing, shortness of breath.   Cardiovascular: Denies chest pain, chest tightness, palpitations or swelling in the hands or feet.  Gastrointestinal: Denies abdominal pain, bloating, constipation, diarrhea or blood in the stool.  GU: Denies urgency, frequency, pain with urination, burning sensation, blood in urine, odor or discharge. Musculoskeletal: Denies decrease in range of motion, difficulty with gait, muscle pain or joint pain and swelling.  Skin: Denies redness, rashes, lesions or ulcercations.  Neurological: Denies dizziness, difficulty with memory, difficulty with speech or problems with balance and coordination.   No other specific complaints in a complete review of systems (except as  listed in HPI above).  Objective:   Physical Exam  BP 134/82 (BP Location: Left Arm, Patient Position: Sitting, Cuff Size: Normal)   Pulse 69   Temp (!) 96.8 F (36 C) (Temporal)   Wt 195 lb (88.5 kg)   SpO2 99%   BMI 28.80 kg/m   Wt Readings from Last 3 Encounters:  02/20/22 192 lb (87.1 kg)  02/09/22 192 lb (87.1 kg)  02/09/22 192 lb (87.1 kg)    General: Appears his stated age, overweight in NAD. Skin: Warm, dry and intact. No rashes noted. HEENT: Head: normal shape and size, no sinus tenderness noted; Eyes:  sclera white, no icterus, conjunctiva pink, PERRLA and EOMs intact; Ears: Tm's gray and intact, normal light reflex; Nose: mucosa pink and moist, septum midline; Throat/Mouth: Teeth present, mucosa erythematous and moist, no exudate, lesions or ulcerations noted.  Neck: No adenopathy noted. Cardiovascular: Normal rate and rhythm.  Pulmonary/Chest: Normal effort and positive vesicular breath sounds. No respiratory distress. No wheezes, rales or ronchi noted.    BMET    Component Value Date/Time   NA 141 02/09/2022 1402   K 4.8 02/09/2022 1402   CL 107 02/09/2022 1402   CO2 22 02/09/2022 1402   GLUCOSE 83 02/09/2022 1402   BUN 45 (H) 02/09/2022 1402   CREATININE 4.17 (H) 02/09/2022 1402   CALCIUM 9.4 02/09/2022 1402   GFRNONAA 48 (L) 05/21/2015 0827   GFRAA 55 (L) 05/21/2015 0827    Lipid Panel     Component Value Date/Time   CHOL 226 (H) 02/09/2022 1402   TRIG 443 (H) 02/09/2022 1402   HDL 47 02/09/2022 1402   CHOLHDL 4.8 02/09/2022 1402   LDLCALC  02/09/2022 1402     Comment:     . LDL cholesterol not calculated. Triglyceride levels greater than 400 mg/dL invalidate calculated LDL results. . Reference range: <100 . Desirable range <100 mg/dL for primary prevention;   <70 mg/dL for patients with CHD or diabetic patients  with > or = 2 CHD risk factors. Marland Kitchen LDL-C is now calculated using the Martin-Hopkins  calculation, which is a validated novel  method providing  better accuracy than the Friedewald equation in the  estimation of LDL-C.  Cresenciano Genre et al. Annamaria Helling. 1696;789(38): 2061-2068  (http://education.[facility].com/faq/FAQ164)     CBC    Component Value Date/Time   WBC 7.0 05/21/2015 0827   RBC 5.05 05/21/2015 0827   HGB 14.7 05/21/2015 0827   HCT 43.4 05/21/2015 0827   PLT 370 05/21/2015 0827   MCV 85.9 05/21/2015 0827   MCH 29.0 05/21/2015 0827   MCHC 33.8 05/21/2015 0827   RDW 13.7 05/21/2015 0827    Hgb A1C Lab Results  Component Value Date   HGBA1C 5.4 02/09/2022            Assessment & Plan:   Viral URI with Cough:  Encourage rest and fluids Rapid flu negative Rapid COVID-negative Continue Claritin and Mucinex Rx for Atrovent twice daily as needed Rx for Promethazine DM for cough   RTC in 4 months for your annual exam Webb Silversmith, NP

## 2022-05-18 ENCOUNTER — Ambulatory Visit: Payer: Self-pay

## 2022-05-18 NOTE — Telephone Encounter (Signed)
I have had a couple of no-shows.  I could work him in today at 1 PM

## 2022-05-18 NOTE — Telephone Encounter (Signed)
  Chief Complaint: Swollen red painful right foot Symptoms: above  Frequency: 3-4 days Pertinent Negatives: Patient denies fever Disposition: [] ED /[x] Urgent Care (no appt availability in office) / [] Appointment(In office/virtual)/ []  [facility]/ [] Home Care/ [] Refused Recommended Disposition /[] [facility]/ []  Follow-up with PCP Additional Notes: Pt reports red swollen painful right foot for 3-4 days. Pt does not remember any injury to foot. Pt does not think he has a fever. Pt has taken IBU with some relief. PT will go to UC and made a follow up for weds.    Summary: right foot is swelling, tender, and red, making it very hard to walk.   Pt stated his right foot is swelling, tender, and red, making it very hard to walk. Stated going on for about 3-4 days. Pain is 6-7 out of 10.   Pt seeking clinical advice.     Reason for Disposition  [1] Looks infected (spreading redness, pus) AND [2] large red area (> 2 in. or 5 cm)  Answer Assessment - Initial Assessment Questions 1. ONSET: "When did the pain start?"      3-4 days ago 2. LOCATION: "Where is the pain located?"      Right foot 3. PAIN: "How bad is the pain?"    (Scale 1-10; or mild, moderate, severe)  - MILD (1-3): doesn't interfere with normal activities.   - MODERATE (4-7): interferes with normal activities (e.g., work or school) or awakens from sleep, limping.   - SEVERE (8-10): excruciating pain, unable to do any normal activities, unable to walk.      7/10 4. WORK OR EXERCISE: "Has there been any recent work or exercise that involved this part of the body?"      No 5. CAUSE: "What do you think is causing the foot pain?"     Unsure 6. OTHER SYMPTOMS: "Do you have any other symptoms?" (e.g., leg pain, rash, fever, numbness)     Red swollen, and warm  Protocols used: Foot Pain-A-AH

## 2022-05-20 ENCOUNTER — Ambulatory Visit (INDEPENDENT_AMBULATORY_CARE_PROVIDER_SITE_OTHER): Payer: Medicare Other | Admitting: Internal Medicine

## 2022-05-20 ENCOUNTER — Encounter: Payer: Self-pay | Admitting: Oncology

## 2022-05-20 ENCOUNTER — Encounter: Payer: Self-pay | Admitting: Internal Medicine

## 2022-05-20 VITALS — BP 136/86 | HR 70 | Temp 96.8°F | Wt 194.0 lb

## 2022-05-20 DIAGNOSIS — L539 Erythematous condition, unspecified: Secondary | ICD-10-CM | POA: Diagnosis not present

## 2022-05-20 DIAGNOSIS — M7989 Other specified soft tissue disorders: Secondary | ICD-10-CM

## 2022-05-20 MED ORDER — CEPHALEXIN 500 MG PO CAPS: 500.0000 mg | ORAL_CAPSULE | Freq: Three times a day (TID) | ORAL | 0 refills | Status: DC

## 2022-05-20 MED ORDER — PREDNISONE 10 MG PO TABS: ORAL_TABLET | ORAL | 0 refills | Status: DC

## 2022-05-20 NOTE — Patient Instructions (Signed)
Gout  Gout is painful swelling of your joints. Gout is a type of arthritis. It is caused by having too much uric acid in your body. Uric acid is a chemical that is made when your body breaks down substances called purines. If your body has too much uric acid, sharp crystals can form and build up in your joints. This causes pain and swelling. Gout attacks can happen quickly and be very painful (acute gout). Over time, the attacks can affect more joints and happen more often (chronic gout). What are the causes? Gout is caused by too much uric acid in your blood. This can happen because: Your kidneys do not remove enough uric acid from your blood. Your body makes too much uric acid. You eat too many foods that are high in purines. These foods include organ meats, some seafood, and beer. Trauma or stress can bring on an attack. What increases the risk? Having a family history of gout. Being male and middle-aged. Being male and having gone through menopause. Having an organ transplant. Taking certain medicines. Having certain conditions, such as: Being very overweight (obese). Lead poisoning. Kidney disease. A skin condition called psoriasis. Other risks include: Losing weight too quickly. Not having enough water in the body (being dehydrated). Drinking alcohol, especially beer. Drinking beverages that are sweetened with a type of sugar called fructose. What are the signs or symptoms? An attack of acute gout often starts at night and usually happens in just one joint. The most common place is the big toe. Other joints that may be affected include joints of the feet, ankle, knee, fingers, wrist, or elbow. Symptoms may include: Very bad pain. Warmth. Swelling. Stiffness. Tenderness. The affected joint may be very painful to touch. Shiny, red, or purple skin. Chills and fever. Chronic gout may cause symptoms more often. More joints may be involved. You may also have white or yellow lumps  (tophi) on your hands or feet or in other areas near your joints. How is this treated? Treatment for an acute attack may include medicines for pain and swelling, such as: NSAIDs, such as ibuprofen. Steroids taken by mouth or injected into a joint. Colchicine. This can be given by mouth or through an IV tube. Treatment to prevent future attacks may include: Taking small doses of NSAIDs or colchicine daily. Using a medicine that reduces uric acid levels in your blood, such as allopurinol. Making changes to your diet. You may need to see a food expert (dietitian) about what to eat and drink to prevent gout. Follow these instructions at home: During a gout attack  If told, put ice on the painful area. To do this: Put ice in a plastic bag. Place a towel between your skin and the bag. Leave the ice on for 20 minutes, 2-3 times a day. Take off the ice if your skin turns bright red. This is very important. If you cannot feel pain, heat, or cold, you have a greater risk of damage to the area. Raise the painful joint above the level of your heart as often as you can. Rest the joint as much as possible. If the joint is in your leg, you may be given crutches. Follow instructions from your doctor about what you cannot eat or drink. Avoiding future gout attacks Eat a low-purine diet. Avoid foods and drinks such as: Liver. Kidney. Anchovies. Asparagus. Herring. Mushrooms. Mussels. Beer. Stay at a healthy weight. If you want to lose weight, talk with your doctor. Do not   lose weight too fast. Start or continue an exercise plan as told by your doctor. Eating and drinking Avoid drinks sweetened by fructose. Drink enough fluids to keep your pee (urine) pale yellow. If you drink alcohol: Limit how much you have to: 0-1 drink a day for women who are not pregnant. 0-2 drinks a day for men. Know how much alcohol is in a drink. In the [place] one drink equals one 12 oz bottle of beer (355 mL), one 5 oz  glass of wine (148 mL), or one 1 oz glass of hard liquor (44 mL). General instructions Take over-the-counter and prescription medicines only as told by your doctor. Ask your doctor if you should avoid driving or using machines while you are taking your medicine. Return to your normal activities when your doctor says that it is safe. Keep all follow-up visits. Where to find more information National Institutes of Health: www.niams.nih.gov Contact a doctor if: You have another gout attack. You still have symptoms of a gout attack after 10 days of treatment. You have problems (side effects) because of your medicines. You have chills or a fever. You have burning pain when you pee (urinate). You have pain in your lower back or belly. Get help right away if: You have very bad pain. Your pain cannot be controlled. You cannot pee. Summary Gout is painful swelling of the joints. The most common site of pain is the big toe, but it can affect other joints. Medicines and avoiding some foods can help to prevent and treat gout attacks. This information is not intended to replace advice given to you by your health care provider. Make sure you discuss any questions you have with your health care provider. Document Revised: 11/20/2020 Document Reviewed: 11/20/2020 Elsevier Patient Education  2023 Elsevier Inc.  

## 2022-05-20 NOTE — Progress Notes (Signed)
Subjective:    Patient ID: Jeffery Hughes, male    DOB: Nov 07, 1965, 57 y.o.   MRN: [id number]  HPI  Patient presents to clinic today with complaint of redness, warmth, swelling and pain of his right foot.  He noticed this a few days ago.  He reports he was working out in the yard and is not sure if he had gotten bitten by anything.  He has no history of gout.  He has tried soaking his foot in Epsom salt.  He denies fever, chills, nausea or vomiting.  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     atorvastatin (LIPITOR) 10 MG tablet Take 1 tablet (10 mg total) by mouth daily. (Patient not taking: Reported on 02/09/2022) 90 tablet 0   calcitRIOL (ROCALTROL) 0.25 MCG capsule Take by mouth.     cyanocobalamin 1000 MCG tablet Take 100 mcg by mouth daily.     diphenoxylate-atropine (LOMOTIL) 2.5-0.025 MG tablet Take 2 tablets by mouth 4 (four) times daily as needed. (Patient not taking: Reported on 02/09/2022) 30 tablet 1   furosemide (LASIX) 40 MG tablet Take 40 mg by mouth daily.     ipratropium (ATROVENT) 0.06 % nasal spray Place 2 sprays into both nostrils 4 (four) times daily. For up to 5-7 days then stop. 15 mL 0   loratadine (CLARITIN) 10 MG tablet Take 10 mg by mouth.     losartan (COZAAR) 100 MG tablet Take 1 tablet (100 mg total) by mouth daily.     Multiple Vitamin (MULTIVITAMIN) tablet Take 1 tablet by mouth daily.     potassium chloride SA (KLOR-CON M) 20 MEQ tablet Take 1 tablet (20 mEq total) by mouth 2 (two) times daily. 180  tablet 1   predniSONE (STERAPRED UNI-PAK 21 TAB) 10 MG (21) TBPK tablet Take 6 pills on day one then decrease by 1 pill each day 21 tablet 0   promethazine-dextromethorphan (PROMETHAZINE-DM) 6.25-15 MG/5ML syrup Take 5 mLs by mouth 4 (four) times daily as needed. 118 mL 0   No current facility-administered medications for this visit.   Facility-Administered Medications Ordered in Other Visits  Medication Dose Route Frequency Provider Last Rate Last Admin   acetaminophen (TYLENOL) tablet 1,000 mg  1,000 mg Oral Once Grayland Ormond, Kathlene November, MD        Allergies  Allergen Reactions   Other Other (See Comments)    Nephrologist recommended avoiding contrast.   Penicillins Other (See Comments)    unknown    Family History  Problem Relation Age of Onset   Stroke Mother    CAD Mother    Heart attack Mother    CAD Father    Skin cancer Father     Social History   Socioeconomic History   Marital status: Married    Spouse name: Gerrit Friends   Number of children: Not on file   Years of education: Not on file   Highest education  level: Not on file  Occupational History   Occupation: Disabled  Tobacco Use   Smoking status: Never   Smokeless tobacco: Never  Vaping Use   Vaping Use: Never used  Substance and Sexual Activity   Alcohol use: No    Alcohol/week: 0.0 standard drinks of alcohol   Drug use: No   Sexual activity: Not on file  Other Topics Concern   Not on file  Social History Narrative   Not on file   Social Determinants of Health   Financial Resource Strain: Low Risk  (02/09/2022)   Overall Financial Resource Strain (CARDIA)    Difficulty of Paying Living Expenses: Not hard at all  Food Insecurity: No Food Insecurity (02/09/2022)   Hunger Vital Sign    Worried About Running Out of Food in the Last Year: Never true    [place] in the Last Year: Never true  Transportation Needs: No Transportation Needs (02/09/2022)   PRAPARE - Radiographer, therapeutic (Medical): No    Lack of Transportation (Non-Medical): No  Physical Activity: Insufficiently Active (02/09/2022)   Exercise Vital Sign    Days of Exercise per Week: 3 days    Minutes of Exercise per Session: 30 min  Stress: No Stress Concern Present (02/09/2022)   [place]    Feeling of Stress : Not at all  Social Connections: [place] (02/09/2022)   Social Connection and Isolation Panel [NHANES]    Frequency of Communication with Friends and Family: More than three times a week    Frequency of Social Gatherings with Friends and Family: Once a week    Attends Religious Services: More than 4 times per year    Active Member of [employer] or Organizations: Yes    Attends Music therapist: More than 4 times per year    Marital Status: Married  Human resources officer Violence: Not At Risk (02/09/2022)   Humiliation, Afraid, Rape, and Kick questionnaire    Fear of Current or Ex-Partner: No    Emotionally Abused: No    Physically Abused: No    Sexually Abused: No     Constitutional: Denies fever, malaise, fatigue, headache or abrupt weight changes.  Respiratory: Denies difficulty breathing, shortness of breath, cough or sputum production.   Cardiovascular: Denies chest pain, chest tightness, palpitations or swelling in the hands or feet.  Musculoskeletal: Patient reports right foot pain and swelling.  Denies decrease in range of motion, difficulty with gait, muscle pain.  Skin: Patient reports redness and warmth of right foot.  Denies rashes, lesions or ulcercations.   No other specific complaints in a complete review of systems (except as listed in HPI above).  Objective:   Physical Exam   BP 136/86 (BP Location: Left Arm, Patient Position: Sitting, Cuff Size: Normal)   Pulse 70   Temp (!) 96.8 F (36 C) (Temporal)   Wt 194 lb (88 kg)   SpO2 99%   BMI 28.65 kg/m   Wt Readings from  Last 3 Encounters:  04/03/22 195 lb (88.5 kg)  02/20/22 192 lb (87.1 kg)  02/09/22 192 lb (87.1 kg)    General: Appears his stated age, overweight, in NAD. Skin: Redness of the right great toe, MTP extending up to the midfoot.  Slight warmth noted. Cardiovascular: Normal rate and rhythm.  Pedal pulse 2+ on the right. Pulmonary/Chest: Normal effort and positive vesicular breath sounds. No respiratory distress. No wheezes, rales  or ronchi noted.  Musculoskeletal: 1+ pitting edema noted of the right foot extending up to the mid tibia.  No difficulty with gait.  Neurological: Alert and oriented.     BMET    Component Value Date/Time   NA 141 02/09/2022 1402   K 4.8 02/09/2022 1402   CL 107 02/09/2022 1402   CO2 22 02/09/2022 1402   GLUCOSE 83 02/09/2022 1402   BUN 45 (H) 02/09/2022 1402   CREATININE 4.17 (H) 02/09/2022 1402   CALCIUM 9.4 02/09/2022 1402   GFRNONAA 48 (L) 05/21/2015 0827   GFRAA 55 (L) 05/21/2015 0827    Lipid Panel     Component Value Date/Time   CHOL 226 (H) 02/09/2022 1402   TRIG 443 (H) 02/09/2022 1402   HDL 47 02/09/2022 1402   CHOLHDL 4.8 02/09/2022 1402   LDLCALC  02/09/2022 1402     Comment:     . LDL cholesterol not calculated. Triglyceride levels greater than 400 mg/dL invalidate calculated LDL results. . Reference range: <100 . Desirable range <100 mg/dL for primary prevention;   <70 mg/dL for patients with CHD or diabetic patients  with > or = 2 CHD risk factors. Marland Kitchen LDL-C is now calculated using the Martin-Hopkins  calculation, which is a validated novel method providing  better accuracy than the Friedewald equation in the  estimation of LDL-C.  Cresenciano Genre et al. Annamaria Helling. [id number]): 2061-2068  (http://education.[facility].com/faq/FAQ164)     CBC    Component Value Date/Time   WBC 7.0 05/21/2015 0827   RBC 5.05 05/21/2015 0827   HGB 14.7 05/21/2015 0827   HCT 43.4 05/21/2015 0827   PLT 370 05/21/2015 0827   MCV 85.9  05/21/2015 0827   MCH 29.0 05/21/2015 0827   MCHC 33.8 05/21/2015 0827   RDW 13.7 05/21/2015 0827    Hgb A1C Lab Results  Component Value Date   HGBA1C 5.4 02/09/2022           Assessment & Plan:   Right Foot Redness and Swelling:  Hard to differentiate if this is gout versus cellulitis, so we will treat for both Rx for Pred taper x 6 days Rx for Keflex 500 mg 3 times daily x 10 days Encourage elevation Will check uric acid at his next visit ER precautions discussed   RTC in 3 months for follow-up of chronic conditions Webb Silversmith, NP

## 2022-08-20 ENCOUNTER — Ambulatory Visit: Payer: Medicare Other | Admitting: Internal Medicine

## 2022-08-20 NOTE — Progress Notes (Deleted)
Subjective:    Patient ID: Jeffery Hughes, male    DOB: 11-Feb-1966, 57 y.o.   MRN: [id number]  HPI  Patient presents to clinic today for 5-month follow-up of chronic conditions.  Fabry Disease with CKD: His last creatinine was 4.04, GFR 16, 01/2022.  He is on losartan for renal protection.  He follows with nephrology.  HTN: His BP today is.  He is taking losartan and furosemide as prescribed.  ECG from 10/2020 reviewed.  HLD: His last LDL was not calculated, triglycerides 443, 01/2022.  He is taking atorvastatin as prescribed.  He does not consume a low-fat diet.  Diarrhea: Chronic, managed withlomotil as needed.  There is no colonoscopy on file.  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 initially diagnosed in [place], [place] in ~ 2003 to 2004, previously treated with Fabrazyme   Fabry disease (HCC)    Hypertension    LVH (left ventricular hypertrophy)    Mixed hyperlipidemia    Vitamin D deficiency     Current Outpatient Medications  Medication Sig Dispense Refill   aspirin 81 MG tablet Take 81 mg by mouth daily.     atorvastatin (LIPITOR) 10 MG tablet Take 1 tablet (10 mg total) by mouth daily. (Patient not taking: Reported on 02/09/2022) 90 tablet 0   calcitRIOL (ROCALTROL) 0.25 MCG capsule Take by mouth.     cephALEXin (KEFLEX) 500 MG capsule Take 1 capsule (500 mg total) by mouth 3 (three) times daily. 30 capsule 0   cyanocobalamin 1000 MCG tablet Take 100 mcg by mouth daily.     diphenoxylate-atropine (LOMOTIL) 2.5-0.025 MG tablet Take 2 tablets by mouth 4 (four) times daily as needed. 30 tablet 1   furosemide (LASIX) 40 MG tablet Take 40 mg by mouth daily.     ipratropium (ATROVENT) 0.06 % nasal spray Place 2 sprays into both nostrils 4 (four) times daily. For up to 5-7 days then stop. 15 mL 0   loratadine (CLARITIN) 10 MG tablet Take 10 mg by mouth.      losartan (COZAAR) 100 MG tablet Take 1 tablet (100 mg total) by mouth daily.     Multiple Vitamin (MULTIVITAMIN) tablet Take 1 tablet by mouth daily.     potassium chloride SA (KLOR-CON M) 20 MEQ tablet Take 1 tablet (20 mEq total) by mouth 2 (two) times daily. 180 tablet 1   predniSONE (DELTASONE) 10 MG tablet Take 6 tabs on day 1, 5 tabs on day 2, 4 tabs on day 3, 3 tabs on day 4, 2 tabs on day 5, 1 tab on day 6 21 tablet 0   promethazine-dextromethorphan (PROMETHAZINE-DM) 6.25-15 MG/5ML syrup Take 5 mLs by mouth 4 (four) times daily as needed. 118 mL 0   No current facility-administered medications for this visit.   Facility-Administered Medications Ordered in Other Visits  Medication Dose Route Frequency Provider Last Rate Last Admin   acetaminophen (TYLENOL) tablet 1,000 mg  1,000 mg Oral Once Jeralyn Ruths, MD        Allergies  Allergen Reactions   Other Other (See Comments)    Nephrologist recommended avoiding contrast.   Penicillins Other (See Comments)    unknown    Family History  Problem Relation Age of Onset   Stroke Mother    CAD Mother    Heart  attack Mother    CAD Father    Skin cancer Father     Social History   Socioeconomic History   Marital status: Married    Spouse name: Tana Conch   Number of children: Not on file   Years of education: Not on file   Highest education level: Not on file  Occupational History   Occupation: Disabled  Tobacco Use   Smoking status: Never   Smokeless tobacco: Never  Vaping Use   Vaping Use: Never used  Substance and Sexual Activity   Alcohol use: No    Alcohol/week: 0.0 standard drinks of alcohol   Drug use: No   Sexual activity: Not on file  Other Topics Concern   Not on file  Social History Narrative   Not on file   Social Determinants of Health   Financial Resource Strain: Low Risk  (02/09/2022)   Overall Financial Resource Strain (CARDIA)    Difficulty of Paying Living Expenses: Not hard at  all  Food Insecurity: No Food Insecurity (02/09/2022)   Hunger Vital Sign    Worried About Running Out of Food in the Last Year: Never true    Ran Out of Food in the Last Year: Never true  Transportation Needs: No Transportation Needs (02/09/2022)   PRAPARE - Administrator, Civil Service (Medical): No    Lack of Transportation (Non-Medical): No  Physical Activity: Insufficiently Active (02/09/2022)   Exercise Vital Sign    Days of Exercise per Week: 3 days    Minutes of Exercise per Session: 30 min  Stress: No Stress Concern Present (02/09/2022)   [employer] of Occupational Health - Occupational Stress Questionnaire    Feeling of Stress : Not at all  Social Connections: Socially Integrated (02/09/2022)   Social Connection and Isolation Panel [NHANES]    Frequency of Communication with Friends and Family: More than three times a week    Frequency of Social Gatherings with Friends and Family: Once a week    Attends Religious Services: More than 4 times per year    Active Member of [employer] or Organizations: Yes    Attends Engineer, structural: More than 4 times per year    Marital Status: Married  Catering manager Violence: Not At Risk (02/09/2022)   Humiliation, Afraid, Rape, and Kick questionnaire    Fear of Current or Ex-Partner: No    Emotionally Abused: No    Physically Abused: No    Sexually Abused: No     Constitutional: Denies fever, malaise, fatigue, headache or abrupt weight changes.  HEENT: Denies eye pain, eye redness, ear pain, ringing in the ears, wax buildup, runny nose, nasal congestion, bloody nose, or sore throat. Respiratory: Denies difficulty breathing, shortness of breath, cough or sputum production.   Cardiovascular: Denies chest pain, chest tightness, palpitations or swelling in the hands or feet.  Gastrointestinal: Patient reports diarrhea.  Denies abdominal pain, bloating, constipation, or blood in the stool.  GU: Denies urgency,  frequency, pain with urination, burning sensation, blood in urine, odor or discharge. Musculoskeletal: Denies decrease in range of motion, difficulty with gait, muscle pain or joint pain and swelling.  Skin: Denies redness, rashes, lesions or ulcercations.  Neurological: Denies dizziness, difficulty with memory, difficulty with speech or problems with balance and coordination.  Psych: Denies anxiety, depression, SI/HI.  No other specific complaints in a complete review of systems (except as listed in HPI above).  Objective:   Physical Exam   There were  no vitals taken for this visit. Wt Readings from Last 3 Encounters:  05/20/22 194 lb (88 kg)  04/03/22 195 lb (88.5 kg)  02/20/22 192 lb (87.1 kg)    General: Appears their stated age, well developed, well nourished in NAD. Skin: Warm, dry and intact. No rashes, lesions or ulcerations noted. HEENT: Head: normal shape and size; Eyes: sclera white, no icterus, conjunctiva pink, PERRLA and EOMs intact; Ears: Tm's gray and intact, normal light reflex; Nose: mucosa pink and moist, septum midline; Throat/Mouth: Teeth present, mucosa pink and moist, no exudate, lesions or ulcerations noted.  Neck:  Neck supple, trachea midline. No masses, lumps or thyromegaly present.  Cardiovascular: Normal rate and rhythm. S1,S2 noted.  No murmur, rubs or gallops noted. No JVD or BLE edema. No carotid bruits noted. Pulmonary/Chest: Normal effort and positive vesicular breath sounds. No respiratory distress. No wheezes, rales or ronchi noted.  Abdomen: Soft and nontender. Normal bowel sounds. No distention or masses noted. Liver, spleen and kidneys non palpable. Musculoskeletal: Normal range of motion. No signs of joint swelling. No difficulty with gait.  Neurological: Alert and oriented. Cranial nerves II-XII grossly intact. Coordination normal.  Psychiatric: Mood and affect normal. Behavior is normal. Judgment and thought content normal.    BMET     Component Value Date/Time   NA 141 02/09/2022 1402   K 4.8 02/09/2022 1402   CL 107 02/09/2022 1402   CO2 22 02/09/2022 1402   GLUCOSE 83 02/09/2022 1402   BUN 45 (H) 02/09/2022 1402   CREATININE 4.17 (H) 02/09/2022 1402   CALCIUM 9.4 02/09/2022 1402   GFRNONAA 48 (L) 05/21/2015 0827   GFRAA 55 (L) 05/21/2015 0827    Lipid Panel     Component Value Date/Time   CHOL 226 (H) 02/09/2022 1402   TRIG 443 (H) 02/09/2022 1402   HDL 47 02/09/2022 1402   CHOLHDL 4.8 02/09/2022 1402   LDLCALC  02/09/2022 1402     Comment:     . LDL cholesterol not calculated. Triglyceride levels greater than 400 mg/dL invalidate calculated LDL results. . Reference range: <100 . Desirable range <100 mg/dL for primary prevention;   <70 mg/dL for patients with CHD or diabetic patients  with > or = 2 CHD risk factors. Marland Kitchen LDL-C is now calculated using the Martin-Hopkins  calculation, which is a validated novel method providing  better accuracy than the Friedewald equation in the  estimation of LDL-C.  Horald Pollen et al. Lenox Ahr. 6213;086(57): 2061-2068  (http://education.[facility].com/faq/FAQ164)     CBC    Component Value Date/Time   WBC 7.0 05/21/2015 0827   RBC 5.05 05/21/2015 0827   HGB 14.7 05/21/2015 0827   HCT 43.4 05/21/2015 0827   PLT 370 05/21/2015 0827   MCV 85.9 05/21/2015 0827   MCH 29.0 05/21/2015 0827   MCHC 33.8 05/21/2015 0827   RDW 13.7 05/21/2015 0827    Hgb A1C Lab Results  Component Value Date   HGBA1C 5.4 02/09/2022           Assessment & Plan:     RTC in 6 months for follow-up of chronic conditions Nicki Reaper, NP

## 2022-10-19 ENCOUNTER — Ambulatory Visit: Payer: Self-pay

## 2022-10-19 NOTE — Telephone Encounter (Signed)
Chief Complaint: Headache Symptoms: Moderate headache, 1 episode of vomiting this morning Frequency: comes and goes  Pertinent Negatives: Patient denies one sided weakness, blurred vision, double vision, chest pain  Disposition: [] ED /[] Urgent Care (no appt availability in office) / [x] Appointment(In office/virtual)/ []  [facility]/ [] Home Care/ [] Refused Recommended Disposition /[] [facility]/ []  Follow-up with PCP Additional Notes: Patient states he has had a headache that comes and goes for a few days but today the pain was a 8/10 and he reports 1 episode of vomiting. Patient stated he has never had a headache like this but thinks it maybe a sinus problem. Patient reports pain behind his eyes and around the sinus area of the face. Care advice given to patient and advised he schedule an appointment for evaluation. Patient is agreeable and has been scheduled with provider on 10/21/22. Advised patient to call back if symptoms get worse, patient verbalized understanding.   Summary: Vomitting & Headache advice   Pt is calling to report headache on and off for a few weeks with headache congestion.- pt vomited this morning. Please advise     Reason for Disposition  [1] New headache AND [2] age > 57  Answer Assessment - Initial Assessment Questions 1. LOCATION: "Where does it hurt?"      It feels like behind my eyes and in the sinus area 2. ONSET: "When did the headache start?" (Minutes, hours or days)      Onset today  3. PATTERN: "Does the pain come and go, or has it been constant since it started?"     Come and go  4. SEVERITY: "How bad is the pain?" and "What does it keep you from doing?"  (e.g., Scale 1-10; mild, moderate, or severe)   - MILD (1-3): doesn't interfere with normal activities    - MODERATE (4-7): interferes with normal activities or awakens from sleep    - SEVERE (8-10): excruciating pain, unable to do any normal activities        8/10 5. RECURRENT  SYMPTOM: "Have you ever had headaches before?" If Yes, ask: "When was the last time?" and "What happened that time?"      No, not that felt like this or lasted this long. 6. CAUSE: "What do you think is causing the headache?"     Sinus pressure  7. MIGRAINE: "Have you been diagnosed with migraine headaches?" If Yes, ask: "Is this headache similar?"      No 8. HEAD INJURY: "Has there been any recent injury to the head?"      No  9. OTHER SYMPTOMS: "Do you have any other symptoms?" (fever, stiff neck, eye pain, sore throat, cold symptoms)     Pain behind the eyes  Protocols used: Headache-A-AH

## 2022-10-21 ENCOUNTER — Encounter: Payer: Self-pay | Admitting: Internal Medicine

## 2022-10-21 ENCOUNTER — Ambulatory Visit (INDEPENDENT_AMBULATORY_CARE_PROVIDER_SITE_OTHER): Payer: Medicare Other | Admitting: Internal Medicine

## 2022-10-21 VITALS — BP 148/80 | HR 65 | Temp 96.8°F | Wt 191.0 lb

## 2022-10-21 DIAGNOSIS — K529 Noninfective gastroenteritis and colitis, unspecified: Secondary | ICD-10-CM | POA: Diagnosis not present

## 2022-10-21 DIAGNOSIS — I10 Essential (primary) hypertension: Secondary | ICD-10-CM

## 2022-10-21 DIAGNOSIS — E663 Overweight: Secondary | ICD-10-CM

## 2022-10-21 DIAGNOSIS — L989 Disorder of the skin and subcutaneous tissue, unspecified: Secondary | ICD-10-CM | POA: Diagnosis not present

## 2022-10-21 DIAGNOSIS — N184 Chronic kidney disease, stage 4 (severe): Secondary | ICD-10-CM | POA: Diagnosis not present

## 2022-10-21 DIAGNOSIS — K219 Gastro-esophageal reflux disease without esophagitis: Secondary | ICD-10-CM | POA: Insufficient documentation

## 2022-10-21 DIAGNOSIS — E781 Pure hyperglyceridemia: Secondary | ICD-10-CM

## 2022-10-21 DIAGNOSIS — R7309 Other abnormal glucose: Secondary | ICD-10-CM

## 2022-10-21 DIAGNOSIS — B9789 Other viral agents as the cause of diseases classified elsewhere: Secondary | ICD-10-CM

## 2022-10-21 DIAGNOSIS — Z6828 Body mass index (BMI) 28.0-28.9, adult: Secondary | ICD-10-CM | POA: Diagnosis not present

## 2022-10-21 DIAGNOSIS — J329 Chronic sinusitis, unspecified: Secondary | ICD-10-CM | POA: Diagnosis not present

## 2022-10-21 DIAGNOSIS — E7521 Fabry (-Anderson) disease: Secondary | ICD-10-CM | POA: Diagnosis not present

## 2022-10-21 MED ORDER — OMEPRAZOLE 40 MG PO CPDR: 40.0000 mg | DELAYED_RELEASE_CAPSULE | Freq: Every day | ORAL | 1 refills | Status: DC

## 2022-10-21 MED ORDER — PREDNISONE 10 MG PO TABS: ORAL_TABLET | ORAL | 0 refills | Status: DC

## 2022-10-21 MED ORDER — ATORVASTATIN CALCIUM 10 MG PO TABS: 10.0000 mg | ORAL_TABLET | Freq: Every day | ORAL | 1 refills | Status: DC

## 2022-10-21 MED ORDER — LOSARTAN POTASSIUM 100 MG PO TABS: 100.0000 mg | ORAL_TABLET | Freq: Every day | ORAL | 1 refills | Status: DC

## 2022-10-21 NOTE — Assessment & Plan Note (Signed)
Continue Lomotil as needed

## 2022-10-21 NOTE — Assessment & Plan Note (Signed)
Elevated today but manual repeat improved but not at goal-she is not feeling well today Continue losartan, refilled today Reinforced DASH diet and exercise for weight loss C-Met today

## 2022-10-21 NOTE — Assessment & Plan Note (Signed)
C-Met today Continue losartan for renal protection He will need follow-up with nephrology

## 2022-10-21 NOTE — Assessment & Plan Note (Signed)
Following with [facility] neph[facility], on transplant list

## 2022-10-21 NOTE — Progress Notes (Signed)
Subjective:    Patient ID: Jeffery Hughes, male    DOB: 03/07/1965, 57 y.o.   MRN: [id number]  HPI  Patient presents to clinic today for follow-up of chronic conditions.  Fabry disease with CKD: His last creatinine was 4.84, GFR 16, 01/2022.  He is awaiting a kidney transplant.  He is on losartan for renal protection.  He follows with nephrology.  HTN: His BP today is 148/80.  He is taking losartan and furosemide as prescribed.  ECG from 10/2020 reviewed.  HLD: His last LDL was not calculated, triglycerides 443, 01/2022.  He is not taking atorvastatin as prescribed.  He does not consume a low-fat diet.  Diarrhea: Chronic, managed with Lomotil as needed.  There is no colonoscopy on file.  He also reports headache, sinus pressure, runny nose and ear fullness. He reports this started 1 week ago. The headache is located in his forehead. He describes the pain as pressure. He is blowing clear mucous out of his nose.  He denies sore throat, cough, chest pain, chest tightness.  He denies fever, chills or bodyaches.  He has tried an antihistamine OTC with minimal relief of symptoms.  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 initially diagnosed in [place], [place] in ~ 2003 to 2004, previously treated with Fabrazyme   Fabry disease (HCC)    Hypertension    LVH (left ventricular hypertrophy)    Mixed hyperlipidemia    Vitamin D deficiency     Current Outpatient Medications  Medication Sig Dispense Refill   aspirin 81 MG tablet Take 81 mg by mouth daily.     atorvastatin (LIPITOR) 10 MG tablet Take 1 tablet (10 mg total) by mouth daily. (Patient not taking: Reported on 02/09/2022) 90 tablet 0   calcitRIOL (ROCALTROL) 0.25 MCG capsule Take by mouth.     cephALEXin (KEFLEX) 500 MG capsule Take 1 capsule (500 mg total) by mouth 3 (three) times daily. 30 capsule 0    cyanocobalamin 1000 MCG tablet Take 100 mcg by mouth daily.     diphenoxylate-atropine (LOMOTIL) 2.5-0.025 MG tablet Take 2 tablets by mouth 4 (four) times daily as needed. 30 tablet 1   furosemide (LASIX) 40 MG tablet Take 40 mg by mouth daily.     ipratropium (ATROVENT) 0.06 % nasal spray Place 2 sprays into both nostrils 4 (four) times daily. For up to 5-7 days then stop. 15 mL 0   loratadine (CLARITIN) 10 MG tablet Take 10 mg by mouth.     losartan (COZAAR) 100 MG tablet Take 1 tablet (100 mg total) by mouth daily.     Multiple Vitamin (MULTIVITAMIN) tablet Take 1 tablet by mouth daily.     potassium chloride SA (KLOR-CON M) 20 MEQ tablet Take 1 tablet (20 mEq total) by mouth 2 (two) times daily. 180 tablet 1   predniSONE (DELTASONE) 10 MG tablet Take 6 tabs on day 1, 5 tabs on day 2, 4 tabs on day 3, 3 tabs on day 4, 2 tabs on day 5, 1 tab on day 6 21 tablet 0   promethazine-dextromethorphan (PROMETHAZINE-DM) 6.25-15 MG/5ML syrup Take 5 mLs by mouth 4 (four) times daily as needed. 118 mL 0   No current facility-administered medications for this visit.   Facility-Administered Medications Ordered in Other Visits  Medication Dose Route Frequency  Provider Last Rate Last Admin   acetaminophen (TYLENOL) tablet 1,000 mg  1,000 mg Oral Once Orlie Dakin, Tollie Pizza, MD        Allergies  Allergen Reactions   Other Other (See Comments)    Nephrologist recommended avoiding contrast.   Penicillins Other (See Comments)    unknown    Family History  Problem Relation Age of Onset   Stroke Mother    CAD Mother    Heart attack Mother    CAD Father    Skin cancer Father     Social History   Socioeconomic History   Marital status: Married    Spouse name: Tana Conch   Number of children: Not on file   Years of education: Not on file   Highest education level: Not on file  Occupational History   Occupation: Disabled  Tobacco Use   Smoking status: Never   Smokeless tobacco: Never  Vaping  Use   Vaping status: Never Used  Substance and Sexual Activity   Alcohol use: No    Alcohol/week: 0.0 standard drinks of alcohol   Drug use: No   Sexual activity: Not on file  Other Topics Concern   Not on file  Social History Narrative   Not on file   Social Determinants of Health   Financial Resource Strain: Low Risk  (02/09/2022)   Overall Financial Resource Strain (CARDIA)    Difficulty of Paying Living Expenses: Not hard at all  Food Insecurity: No Food Insecurity (02/09/2022)   Hunger Vital Sign    Worried About Running Out of Food in the Last Year: Never true    Ran Out of Food in the Last Year: Never true  Transportation Needs: No Transportation Needs (02/09/2022)   PRAPARE - Administrator, Civil Service (Medical): No    Lack of Transportation (Non-Medical): No  Physical Activity: Insufficiently Active (02/09/2022)   Exercise Vital Sign    Days of Exercise per Week: 3 days    Minutes of Exercise per Session: 30 min  Stress: No Stress Concern Present (02/09/2022)   [employer] of Occupational Health - Occupational Stress Questionnaire    Feeling of Stress : Not at all  Social Connections: Socially Integrated (02/09/2022)   Social Connection and Isolation Panel [NHANES]    Frequency of Communication with Friends and Family: More than three times a week    Frequency of Social Gatherings with Friends and Family: Once a week    Attends Religious Services: More than 4 times per year    Active Member of [employer] or Organizations: Yes    Attends Banker Meetings: More than 4 times per year    Marital Status: Married  Catering manager Violence: Not At Risk (02/09/2022)   Humiliation, Afraid, Rape, and Kick questionnaire    Fear of Current or Ex-Partner: No    Emotionally Abused: No    Physically Abused: No    Sexually Abused: No     Constitutional: Patient reports headache.  Denies fever, malaise, fatigue, or abrupt weight changes.  HEENT:  Patient reports runny nose and ear fullness.  Denies eye pain, eye redness, ear pain, ringing in the ears, wax buildup, runny nose, nasal congestion, bloody nose, or sore throat. Respiratory: Denies difficulty breathing, shortness of breath, cough or sputum production.   Cardiovascular: Denies chest pain, chest tightness, palpitations or swelling in the hands or feet.  Gastrointestinal: Patient reports diarrhea, intermittent nausea and vomiting.  Denies abdominal pain, bloating, constipation, or  blood in the stool.  GU: Denies urgency, frequency, pain with urination, burning sensation, blood in urine, odor or discharge. Musculoskeletal: Denies decrease in range of motion, difficulty with gait, muscle pain or joint pain and swelling.  Skin: Patient reports recurrent skin lesion under left eye.  Denies redness, rashes, lesions or ulcercations.  Neurological: Denies dizziness, difficulty with memory, difficulty with speech or problems with balance and coordination.  Psych: Denies anxiety, depression, SI/HI.  No other specific complaints in a complete review of systems (except as listed in HPI above).  Objective:   Physical Exam  BP (!) 148/80 (BP Location: Left Arm, Patient Position: Sitting, Cuff Size: Normal)   Pulse 65   Temp (!) 96.8 F (36 C) (Temporal)   Wt 191 lb (86.6 kg)   SpO2 98%   BMI 28.21 kg/m   Wt Readings from Last 3 Encounters:  05/20/22 194 lb (88 kg)  04/03/22 195 lb (88.5 kg)  02/20/22 192 lb (87.1 kg)    General: Appears his stated age,, in NAD. Skin: Warm, dry and intact.  Triangular-shaped Lesion underneath left eye. HEENT: Head: normal shape and size, maxillary sinus pressure noted; Eyes: sclera white, no icterus, conjunctiva pink, PERRLA and EOMs intact; Ears: Tm's gray and intact, normal light reflex; Nose: mucosa pink and moist, septum midline; Throat/Mouth: Teeth present, mucosa erythema this and moist, Compeau sign PND, no exudate, lesions or ulcerations  noted.  Neck: No adenopathy noted. Cardiovascular: Normal rate and rhythm. S1,S2 noted.  No murmur, rubs or gallops noted. No JVD.  1+ LLE edema. No carotid bruits noted. Pulmonary/Chest: Normal effort and positive vesicular breath sounds. No respiratory distress. No wheezes, rales or ronchi noted.  Abdomen: Normal bowel sounds.  Musculoskeletal: N. No difficulty with gait.  Neurological: Alert and oriented. Coordination normal.  Psychiatric: Mood and affect normal. Behavior is normal. Judgment and thought content normal.     BMET    Component Value Date/Time   NA 141 02/09/2022 1402   K 4.8 02/09/2022 1402   CL 107 02/09/2022 1402   CO2 22 02/09/2022 1402   GLUCOSE 83 02/09/2022 1402   BUN 45 (H) 02/09/2022 1402   CREATININE 4.17 (H) 02/09/2022 1402   CALCIUM 9.4 02/09/2022 1402   GFRNONAA 48 (L) 05/21/2015 0827   GFRAA 55 (L) 05/21/2015 0827    Lipid Panel     Component Value Date/Time   CHOL 226 (H) 02/09/2022 1402   TRIG 443 (H) 02/09/2022 1402   HDL 47 02/09/2022 1402   CHOLHDL 4.8 02/09/2022 1402   LDLCALC  02/09/2022 1402     Comment:     . LDL cholesterol not calculated. Triglyceride levels greater than 400 mg/dL invalidate calculated LDL results. . Reference range: <100 . Desirable range <100 mg/dL for primary prevention;   <70 mg/dL for patients with CHD or diabetic patients  with > or = 2 CHD risk factors. Marland Kitchen LDL-C is now calculated using the Martin-Hopkins  calculation, which is a validated novel method providing  better accuracy than the Friedewald equation in the  estimation of LDL-C.  Horald Pollen et al. Lenox Ahr. 8413;244(01): 2061-2068  (http://education.[facility].com/faq/FAQ164)     CBC    Component Value Date/Time   WBC 7.0 05/21/2015 0827   RBC 5.05 05/21/2015 0827   HGB 14.7 05/21/2015 0827   HCT 43.4 05/21/2015 0827   PLT 370 05/21/2015 0827   MCV 85.9 05/21/2015 0827   MCH 29.0 05/21/2015 0827   MCHC 33.8 05/21/2015 0827   RDW  13.7 05/21/2015 0827    Hgb A1C Lab Results  Component Value Date   HGBA1C 5.4 02/09/2022           Assessment & Plan:   Viral sinusitis:  Encourage rest and fluids Continue antihistamine OTC Rx for Pred taper x 6 days No indication for antibiotics at this time but would consider symptoms persist or worsen  Skin lesion of face:  Referral to dermatology for further evaluation of symptoms  RTC in 6 months, follow-up chronic conditions Nicki Reaper, NP

## 2022-10-21 NOTE — Assessment & Plan Note (Signed)
C-Met and lipid profile today Advised him to start taking atorvastatin as prescribed Encouraged low-fat diet

## 2022-10-21 NOTE — Patient Instructions (Signed)
Sinus Pain  Sinus pain happens when your sinuses get swollen or blocked (clogged). Sinuses are spaces behind the bones of your face and forehead. You may feel pain or pressure in your face, forehead, ears, or upper teeth. Sinus pain can be mild or very bad. What are the causes? Sinus pain can result from conditions that affect your sinuses. Common causes include: Colds. Sinus infections. Allergies. What are the signs or symptoms? The main symptom of this condition is pain or pressure in your face, forehead, ears, or upper teeth. People who have sinus pain often have other symptoms, such as: Stuffed up or runny nose. Fever. Not being able to smell. Headache. Weather changes can make your symptoms worse. How is this treated? Treatment for this condition depends on the cause. Sinus pain caused by: A sinus infection may be treated with antibiotic medicine. A stuffy nose may be helped by rinsing out the nose and sinuses with a salt water solution. Allergies may be helped by allergy medicines and nasal sprays. Surgery may be needed in some cases if other treatments do not help. Follow these instructions at home: General instructions If told: Apply a warm, moist washcloth to your face. This can help to lessen pain. Use a nasal salt water wash. Follow the directions on the bottle or box. Hydrate and humidify Drink enough water to keep your pee (urine) pale yellow. Use a humidifier if your home is dry. Breathe in steam for 10-15 minutes, 3-4 times a day or as told by your doctor. You can do this in the bathroom while a hot shower is running. Try not to spend time in cool or dry air. Medicines  Take over-the-counter and prescription medicines only as told by your doctor. If you were prescribed an antibiotic medicine, take it as told by your doctor. Do not stop taking it even if you start to feel better. Use a nose spray if your nose feels full of mucus (congested). Contact a doctor if: You  get more than one headache a week. Light or sound bothers you. You have a fever. You feel sick to your stomach (nauseous) or you vomit. Your headaches do not get better with treatment. Get help right away if: You have trouble seeing. You suddenly have very bad pain in your face or head. You start to have quick, sudden movements or shaking that you cannot control (seizure). You are confused. You have a stiff neck. Summary Sinus pain happens when your sinuses get swollen or blocked (clogged). Sinuses are spaces behind the bones of your face and forehead. You may feel pain or pressure in your face, forehead, ears, or upper teeth. Take over-the-counter and prescription medicines only as told by your doctor. If told, apply a warm, moist washcloth to your face. This can help to lessen pain. This information is not intended to replace advice given to you by your health care provider. Make sure you discuss any questions you have with your health care provider. Document Revised: 01/19/2021 Document Reviewed: 01/19/2021 Elsevier Patient Education  2024 [employer].

## 2022-10-21 NOTE — Assessment & Plan Note (Signed)
Will trial omeprazole 20 mg daily

## 2022-10-21 NOTE — Assessment & Plan Note (Signed)
Encourage diet and exercise for weight loss 

## 2022-10-22 LAB — COMPLETE METABOLIC PANEL WITH GFR
AG Ratio: 1.6 (calc) (ref 1.0–2.5)
ALT: 19 U/L (ref 9–46)
AST: 16 U/L (ref 10–35)
Albumin: 4.2 g/dL (ref 3.6–5.1)
Alkaline phosphatase (APISO): 61 U/L (ref 35–144)
BUN/Creatinine Ratio: 7 (calc) (ref 6–22)
BUN: 40 mg/dL — ABNORMAL HIGH (ref 7–25)
CO2: 20 mmol/L (ref 20–32)
Calcium: 9.6 mg/dL (ref 8.6–10.3)
Chloride: 106 mmol/L (ref 98–110)
Creat: 5.44 mg/dL — ABNORMAL HIGH (ref 0.70–1.30)
Globulin: 2.7 g/dL (ref 1.9–3.7)
Glucose, Bld: 80 mg/dL (ref 65–99)
Potassium: 4.8 mmol/L (ref 3.5–5.3)
Sodium: 138 mmol/L (ref 135–146)
Total Bilirubin: 0.6 mg/dL (ref 0.2–1.2)
Total Protein: 6.9 g/dL (ref 6.1–8.1)
eGFR: 11 mL/min/{1.73_m2} — ABNORMAL LOW (ref >=60)

## 2022-10-22 LAB — CBC
HCT: 43.1 % (ref 38.5–50.0)
Hemoglobin: 14.2 g/dL (ref 13.2–17.1)
MCH: 29.2 pg (ref 27.0–33.0)
MCHC: 32.9 g/dL (ref 32.0–36.0)
MCV: 88.7 fL (ref 80.0–100.0)
MPV: 9.9 fL (ref 7.5–12.5)
Platelets: 292 10*3/uL (ref 140–400)
RBC: 4.86 10*6/uL (ref 4.20–5.80)
RDW: 13.4 % (ref 11.0–15.0)
WBC: 7.2 10*3/uL (ref 3.8–10.8)

## 2022-10-22 LAB — LIPID PANEL
Cholesterol: 235 mg/dL — ABNORMAL HIGH (ref <200)
HDL: 45 mg/dL (ref >=40)
LDL Cholesterol (Calc): 142 mg/dL — ABNORMAL HIGH
Non-HDL Cholesterol (Calc): 190 mg/dL — ABNORMAL HIGH (ref <130)
Total CHOL/HDL Ratio: 5.2 (calc) — ABNORMAL HIGH (ref <5.0)
Triglycerides: 313 mg/dL — ABNORMAL HIGH (ref <150)

## 2022-10-22 LAB — HEMOGLOBIN A1C
Hgb A1c MFr Bld: 5.4 %{Hb} (ref <5.7)
Mean Plasma Glucose: 108 mg/dL
eAG (mmol/L): 6 mmol/L

## 2022-10-22 NOTE — Addendum Note (Signed)
Addended by: Lorre Munroe on: 10/22/2022 09:16 AM   Modules accepted: Orders

## 2022-11-18 DIAGNOSIS — R809 Proteinuria, unspecified: Secondary | ICD-10-CM | POA: Diagnosis not present

## 2022-11-18 DIAGNOSIS — N185 Chronic kidney disease, stage 5: Secondary | ICD-10-CM | POA: Diagnosis not present

## 2022-11-18 DIAGNOSIS — E7521 Fabry (-Anderson) disease: Secondary | ICD-10-CM | POA: Diagnosis not present

## 2022-11-18 DIAGNOSIS — N2581 Secondary hyperparathyroidism of renal origin: Secondary | ICD-10-CM | POA: Diagnosis not present

## 2022-11-18 DIAGNOSIS — I10 Essential (primary) hypertension: Secondary | ICD-10-CM | POA: Diagnosis not present

## 2022-11-24 DIAGNOSIS — Z23 Encounter for immunization: Secondary | ICD-10-CM | POA: Diagnosis not present

## 2023-01-20 ENCOUNTER — Telehealth: Payer: Self-pay | Admitting: Internal Medicine

## 2023-01-20 NOTE — Telephone Encounter (Signed)
Called 01/20/2023 to sched Annual Wellness Visit - MAILBOX FULL  Verlee Rossetti; Care Guide Ambulatory Clinical Support [place] [facility] Direct Dial: [phone]

## 2023-02-16 ENCOUNTER — Encounter: Payer: Self-pay | Admitting: Internal Medicine

## 2023-02-16 ENCOUNTER — Ambulatory Visit (INDEPENDENT_AMBULATORY_CARE_PROVIDER_SITE_OTHER): Payer: Medicare Other | Admitting: Internal Medicine

## 2023-02-16 VITALS — BP 134/82 | HR 65 | Ht 69.0 in | Wt 190.0 lb

## 2023-02-16 DIAGNOSIS — Z6828 Body mass index (BMI) 28.0-28.9, adult: Secondary | ICD-10-CM | POA: Diagnosis not present

## 2023-02-16 DIAGNOSIS — E781 Pure hyperglyceridemia: Secondary | ICD-10-CM

## 2023-02-16 DIAGNOSIS — E7521 Fabry (-Anderson) disease: Secondary | ICD-10-CM | POA: Diagnosis not present

## 2023-02-16 DIAGNOSIS — R739 Hyperglycemia, unspecified: Secondary | ICD-10-CM | POA: Diagnosis not present

## 2023-02-16 DIAGNOSIS — K529 Noninfective gastroenteritis and colitis, unspecified: Secondary | ICD-10-CM | POA: Diagnosis not present

## 2023-02-16 DIAGNOSIS — I10 Essential (primary) hypertension: Secondary | ICD-10-CM

## 2023-02-16 DIAGNOSIS — K219 Gastro-esophageal reflux disease without esophagitis: Secondary | ICD-10-CM

## 2023-02-16 DIAGNOSIS — E663 Overweight: Secondary | ICD-10-CM | POA: Diagnosis not present

## 2023-02-16 DIAGNOSIS — N185 Chronic kidney disease, stage 5: Secondary | ICD-10-CM | POA: Diagnosis not present

## 2023-02-16 MED ORDER — POTASSIUM CHLORIDE CRYS ER 20 MEQ PO TBCR: 20.0000 meq | EXTENDED_RELEASE_TABLET | Freq: Two times a day (BID) | ORAL | 1 refills | Status: DC

## 2023-02-16 NOTE — Progress Notes (Signed)
Subjective:    Patient ID: Jeffery Hughes, male    DOB: 04-27-1965, 57 y.o.   MRN: [id number]  HPI  Patient presents to clinic today for follow-up of chronic conditions.  Fabry disease with ckd: His last creatinine was 5.59, GFR 11, 11/2022.  He is awaiting a kidney transplant.  He is on losartan for renal protection.  He follows with nephrology.  HTN: His BP today is 134/82.  He is taking losartan and furosemide as prescribed.  ECG from 10/2020 reviewed.  HLD: His last LDL was 142, triglycerides 313, 10/2022.  He is not taking atorvastatin as prescribed because he felt bad while taking it.  He does not consume a low-fat diet.  Diarrhea: Chronic, managed with lomotil as needed.  There is no colonoscopy on file.  GERD: Triggered by eating and laying down.  He is not taking omeprazole due to side effects.  There is no upper GI on file.  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 initially diagnosed in [place], [place] in ~ 2003 to 2004, previously treated with Fabrazyme   Fabry disease (HCC)    Hypertension    LVH (left ventricular hypertrophy)    Mixed hyperlipidemia    Vitamin D deficiency     Current Outpatient Medications  Medication Sig Dispense Refill   aspirin 81 MG tablet Take 81 mg by mouth daily.     atorvastatin (LIPITOR) 10 MG tablet Take 1 tablet (10 mg total) by mouth daily. 90 tablet 1   cyanocobalamin 1000 MCG tablet Take 100 mcg by mouth daily.     diphenoxylate-atropine (LOMOTIL) 2.5-0.025 MG tablet Take 2 tablets by mouth 4 (four) times daily as needed. 30 tablet 1   furosemide (LASIX) 40 MG tablet Take 40 mg by mouth daily.     loratadine (CLARITIN) 10 MG tablet Take 10 mg by mouth.     losartan (COZAAR) 100 MG tablet Take 1 tablet (100 mg total) by mouth daily. 90 tablet 1   Multiple Vitamin (MULTIVITAMIN) tablet Take 1 tablet by mouth daily.      omeprazole (PRILOSEC) 40 MG capsule Take 1 capsule (40 mg total) by mouth daily. 90 capsule 1   potassium chloride SA (KLOR-CON M) 20 MEQ tablet Take 1 tablet (20 mEq total) by mouth 2 (two) times daily. 180 tablet 1   predniSONE (DELTASONE) 10 MG tablet Take 6 tabs on day 1, 5 tabs on day 2, 4 tabs on day 3, 3 tabs on day 4, 2 tabs on day 5, 1 tab on day 6 21 tablet 0   No current facility-administered medications for this visit.   Facility-Administered Medications Ordered in Other Visits  Medication Dose Route Frequency Provider Last Rate Last Admin   acetaminophen (TYLENOL) tablet 1,000 mg  1,000 mg Oral Once Orlie Dakin, Tollie Pizza, MD        Allergies  Allergen Reactions   Other Other (See Comments)    Nephrologist recommended avoiding contrast.   Penicillins Other (See Comments)    unknown    Family History  Problem Relation Age of Onset   Stroke Mother    CAD Mother    Heart attack Mother    CAD Father    Skin cancer Father     Social History   Socioeconomic History   Marital status: Married  Spouse name: Tana Conch   Number of children: Not on file   Years of education: Not on file   Highest education level: Not on file  Occupational History   Occupation: Disabled  Tobacco Use   Smoking status: Never   Smokeless tobacco: Never  Vaping Use   Vaping status: Never Used  Substance and Sexual Activity   Alcohol use: No    Alcohol/week: 0.0 standard drinks of alcohol   Drug use: No   Sexual activity: Not on file  Other Topics Concern   Not on file  Social History Narrative   Not on file   Social Drivers of Health   Financial Resource Strain: Low Risk  (02/09/2022)   Overall Financial Resource Strain (CARDIA)    Difficulty of Paying Living Expenses: Not hard at all  Food Insecurity: No Food Insecurity (02/09/2022)   Hunger Vital Sign    Worried About Running Out of Food in the Last Year: Never true    Ran Out of Food in the Last Year: Never true   Transportation Needs: No Transportation Needs (02/09/2022)   PRAPARE - Administrator, Civil Service (Medical): No    Lack of Transportation (Non-Medical): No  Physical Activity: Insufficiently Active (02/09/2022)   Exercise Vital Sign    Days of Exercise per Week: 3 days    Minutes of Exercise per Session: 30 min  Stress: No Stress Concern Present (02/09/2022)   [employer] of Occupational Health - Occupational Stress Questionnaire    Feeling of Stress : Not at all  Social Connections: Socially Integrated (02/09/2022)   Social Connection and Isolation Panel [NHANES]    Frequency of Communication with Friends and Family: More than three times a week    Frequency of Social Gatherings with Friends and Family: Once a week    Attends Religious Services: More than 4 times per year    Active Member of [employer] or Organizations: Yes    Attends Engineer, structural: More than 4 times per year    Marital Status: Married  Catering manager Violence: Not At Risk (02/09/2022)   Humiliation, Afraid, Rape, and Kick questionnaire    Fear of Current or Ex-Partner: No    Emotionally Abused: No    Physically Abused: No    Sexually Abused: No     Constitutional:  Denies fever, malaise, headache, fatigue, or abrupt weight changes.  HEENT: Denies eye pain, eye redness, ear pain, ringing in the ears, wax buildup, runny nose, nasal congestion, bloody nose, or sore throat. Respiratory: Denies difficulty breathing, shortness of breath, cough or sputum production.   Cardiovascular: Denies chest pain, chest tightness, palpitations or swelling in the hands or feet.  Gastrointestinal: Patient reports diarrhea, intermittent nausea and vomiting.  Denies abdominal pain, bloating, constipation, or blood in the stool.  GU: Denies urgency, frequency, pain with urination, burning sensation, blood in urine, odor or discharge. Musculoskeletal: Denies decrease in range of motion, difficulty with  gait, muscle pain or joint pain and swelling.  Skin: Denies redness, rashes, lesions or ulcercations.  Neurological: Denies dizziness, difficulty with memory, difficulty with speech or problems with balance and coordination.  Psych: Denies anxiety, depression, SI/HI.  No other specific complaints in a complete review of systems (except as listed in HPI above).  Objective:   Physical Exam  BP 134/82   Pulse 65   Ht 5\' 9"  (1.753 m)   Wt 190 lb (86.2 kg)   SpO2 98%   BMI 28.06 kg/m  Wt Readings from Last 3 Encounters:  10/21/22 191 lb (86.6 kg)  05/20/22 194 lb (88 kg)  04/03/22 195 lb (88.5 kg)    General: Appears his stated age, overweight, in NAD. Skin: Warm, dry and intact.   HEENT: Head: normal shape and size, maxillary sinus pressure noted; Eyes: sclera white, no icterus, conjunctiva pink, PERRLA and EOMs intact;  Cardiovascular: Normal rate and rhythm. S1,S2 noted.  No murmur, rubs or gallops noted. No JVD.  1+ LLE edema. No carotid bruits noted. Pulmonary/Chest: Normal effort and positive vesicular breath sounds. No respiratory distress. No wheezes, rales or ronchi noted.  Abdomen: Normal bowel sounds.  Musculoskeletal: No difficulty with gait.  Neurological: Alert and oriented. Coordination normal.  Psychiatric: Mood and affect normal. Behavior is normal. Judgment and thought content normal.     BMET    Component Value Date/Time   NA 138 10/21/2022 1007   K 4.8 10/21/2022 1007   CL 106 10/21/2022 1007   CO2 20 10/21/2022 1007   GLUCOSE 80 10/21/2022 1007   BUN 40 (H) 10/21/2022 1007   CREATININE 5.44 (H) 10/21/2022 1007   CALCIUM 9.6 10/21/2022 1007   GFRNONAA 48 (L) 05/21/2015 0827   GFRAA 55 (L) 05/21/2015 0827    Lipid Panel     Component Value Date/Time   CHOL 235 (H) 10/21/2022 1007   TRIG 313 (H) 10/21/2022 1007   HDL 45 10/21/2022 1007   CHOLHDL 5.2 (H) 10/21/2022 1007   LDLCALC 142 (H) 10/21/2022 1007    CBC    Component Value Date/Time    WBC 7.2 10/21/2022 1007   RBC 4.86 10/21/2022 1007   HGB 14.2 10/21/2022 1007   HCT 43.1 10/21/2022 1007   PLT 292 10/21/2022 1007   MCV 88.7 10/21/2022 1007   MCH 29.2 10/21/2022 1007   MCHC 32.9 10/21/2022 1007   RDW 13.4 10/21/2022 1007    Hgb A1C Lab Results  Component Value Date   HGBA1C 5.4 10/21/2022           Assessment & Plan:    RTC in 6 months, follow-up chronic conditions Nicki Reaper, NP

## 2023-02-16 NOTE — Assessment & Plan Note (Signed)
C-Met and lipid profile today He does not want to take statin therapy Encouraged low-fat diet

## 2023-02-16 NOTE — Assessment & Plan Note (Signed)
Continue Lomotil as needed

## 2023-02-16 NOTE — Assessment & Plan Note (Signed)
Following with [facility] neph[facility], on transplant list

## 2023-02-16 NOTE — Assessment & Plan Note (Signed)
Continue losartan, refilled today Reinforced DASH diet and exercise for weight loss C-Met today

## 2023-02-16 NOTE — Assessment & Plan Note (Signed)
Encourage diet and exercise for weight loss 

## 2023-02-16 NOTE — Assessment & Plan Note (Signed)
C-Met today Continue losartan for renal protection He will need follow-up with nephrology

## 2023-02-16 NOTE — Patient Instructions (Signed)

## 2023-02-16 NOTE — Assessment & Plan Note (Signed)
He does not want to take omeprazole due to side effects

## 2023-02-17 LAB — CBC
HCT: 41 % (ref 38.5–50.0)
Hemoglobin: 13.5 g/dL (ref 13.2–17.1)
MCH: 30 pg (ref 27.0–33.0)
MCHC: 32.9 g/dL (ref 32.0–36.0)
MCV: 91.1 fL (ref 80.0–100.0)
MPV: 10.1 fL (ref 7.5–12.5)
Platelets: 295 10*3/uL (ref 140–400)
RBC: 4.5 10*6/uL (ref 4.20–5.80)
RDW: 13.1 % (ref 11.0–15.0)
WBC: 8 10*3/uL (ref 3.8–10.8)

## 2023-02-17 LAB — COMPLETE METABOLIC PANEL WITH GFR
AG Ratio: 1.7 (calc) (ref 1.0–2.5)
ALT: 16 U/L (ref 9–46)
AST: 15 U/L (ref 10–35)
Albumin: 4.3 g/dL (ref 3.6–5.1)
Alkaline phosphatase (APISO): 59 U/L (ref 35–144)
BUN/Creatinine Ratio: 8 (calc) (ref 6–22)
BUN: 48 mg/dL — ABNORMAL HIGH (ref 7–25)
CO2: 21 mmol/L (ref 20–32)
Calcium: 9.1 mg/dL (ref 8.6–10.3)
Chloride: 108 mmol/L (ref 98–110)
Creat: 5.83 mg/dL — ABNORMAL HIGH (ref 0.70–1.30)
Globulin: 2.5 g/dL (ref 1.9–3.7)
Glucose, Bld: 82 mg/dL (ref 65–139)
Potassium: 4.1 mmol/L (ref 3.5–5.3)
Sodium: 140 mmol/L (ref 135–146)
Total Bilirubin: 0.5 mg/dL (ref 0.2–1.2)
Total Protein: 6.8 g/dL (ref 6.1–8.1)
eGFR: 10 mL/min/{1.73_m2} — ABNORMAL LOW (ref >=60)

## 2023-02-17 LAB — HEMOGLOBIN A1C
Hgb A1c MFr Bld: 5.2 %{Hb} (ref <5.7)
Mean Plasma Glucose: 103 mg/dL
eAG (mmol/L): 5.7 mmol/L

## 2023-02-17 LAB — LIPID PANEL
Cholesterol: 218 mg/dL — ABNORMAL HIGH (ref <200)
HDL: 34 mg/dL — ABNORMAL LOW (ref >=40)
Non-HDL Cholesterol (Calc): 184 mg/dL — ABNORMAL HIGH (ref <130)
Total CHOL/HDL Ratio: 6.4 (calc) — ABNORMAL HIGH (ref <5.0)
Triglycerides: 525 mg/dL — ABNORMAL HIGH (ref <150)

## 2023-03-05 ENCOUNTER — Encounter: Payer: Self-pay | Admitting: Internal Medicine

## 2023-03-05 ENCOUNTER — Ambulatory Visit (INDEPENDENT_AMBULATORY_CARE_PROVIDER_SITE_OTHER): Payer: Medicare Other | Admitting: Internal Medicine

## 2023-03-05 VITALS — BP 132/88 | HR 65 | Ht 69.0 in | Wt 185.8 lb

## 2023-03-05 DIAGNOSIS — J069 Acute upper respiratory infection, unspecified: Secondary | ICD-10-CM

## 2023-03-05 MED ORDER — PROMETHAZINE-DM 6.25-15 MG/5ML PO SYRP: 5.0000 mL | ORAL_SOLUTION | Freq: Four times a day (QID) | ORAL | 0 refills | Status: DC | PRN

## 2023-03-05 MED ORDER — AZITHROMYCIN 250 MG PO TABS: ORAL_TABLET | ORAL | 0 refills | Status: DC

## 2023-03-05 NOTE — Patient Instructions (Signed)

## 2023-03-05 NOTE — Progress Notes (Signed)
 Subjective:    Patient ID: NEZIAH BRALEY, male    DOB: 1965-07-03, 58 y.o.   MRN: [id number]  HPI  Discussed the use of AI scribe software for clinical note transcription with the patient, who gave verbal consent to proceed.  The patient, with a known allergy to penicillins, presents with a three-week history of productive cough. Despite self-management with over-the-counter Mucinex , Nyquil, and DayQuil, the cough persists. The patient describes the cough as productive, with mucus expectoration, but denies any associated symptoms such as headache, nasal congestion, ear pain, sore throat, shortness of breath, nausea, vomiting, diarrhea, fever, chills, or body aches. He also denies any history of asthma and smoking. The patient has been trying to stay hydrated with water and Gatorade. The patient's symptoms have not improved despite his efforts, leading to frustration and the decision to seek medical attention.       Review of Systems   Past Medical History:  Diagnosis Date   CKD (chronic kidney disease), stage II    a. secodnary to Fabry's disease   Fabry disease (HCC)    a. initially diagnosed in [place], [place] in ~ 2003 to 2004, previously treated with Fabrazyme    Fabry disease (HCC)    Hypertension    LVH (left ventricular hypertrophy)    Mixed hyperlipidemia    Vitamin D deficiency     Current Outpatient Medications  Medication Sig Dispense Refill   aspirin  81 MG tablet Take 81 mg by mouth daily.     atorvastatin  (LIPITOR) 10 MG tablet Take 1 tablet (10 mg total) by mouth daily. (Patient not taking: Reported on 02/16/2023) 90 tablet 1   cyanocobalamin 1000 MCG tablet Take 100 mcg by mouth daily.     diphenoxylate -atropine  (LOMOTIL ) 2.5-0.025 MG tablet Take 2 tablets by mouth 4 (four) times daily as needed. 30 tablet 1   furosemide  (LASIX ) 40 MG tablet Take 40 mg by mouth daily. PRN     loratadine  (CLARITIN ) 10 MG tablet Take 10 mg by mouth.     losartan  (COZAAR ) 100 MG tablet  Take 1 tablet (100 mg total) by mouth daily. 90 tablet 1   Multiple Vitamin (MULTIVITAMIN) tablet Take 1 tablet by mouth daily.     potassium chloride  SA (KLOR-CON  M) 20 MEQ tablet Take 1 tablet (20 mEq total) by mouth 2 (two) times daily. 180 tablet 1   No current facility-administered medications for this visit.   Facility-Administered Medications Ordered in Other Visits  Medication Dose Route Frequency Provider Last Rate Last Admin   acetaminophen  (TYLENOL ) tablet 1,000 mg  1,000 mg Oral Once Jacobo, Evalene PARAS, MD        Allergies  Allergen Reactions   Other Other (See Comments)    Nephrologist recommended avoiding contrast.   Penicillins Other (See Comments)    unknown    Family History  Problem Relation Age of Onset   Stroke Mother    CAD Mother    Heart attack Mother    CAD Father    Skin cancer Father     Social History   Socioeconomic History   Marital status: Married    Spouse name: Sulane Fishe   Number of children: Not on file   Years of education: Not on file   Highest education level: Not on file  Occupational History   Occupation: Disabled  Tobacco Use   Smoking status: Never   Smokeless tobacco: Never  Vaping Use   Vaping status: Never Used  Substance and Sexual Activity  Alcohol use: No    Alcohol/week: 0.0 standard drinks of alcohol   Drug use: No   Sexual activity: Not on file  Other Topics Concern   Not on file  Social History Narrative   Not on file   Social Drivers of Health   Financial Resource Strain: Low Risk  (02/09/2022)   Overall Financial Resource Strain (CARDIA)    Difficulty of Paying Living Expenses: Not hard at all  Food Insecurity: No Food Insecurity (02/09/2022)   Hunger Vital Sign    Worried About Running Out of Food in the Last Year: Never true    Ran Out of Food in the Last Year: Never true  Transportation Needs: No Transportation Needs (02/09/2022)   PRAPARE - Administrator, Civil Service (Medical):  No    Lack of Transportation (Non-Medical): No  Physical Activity: Insufficiently Active (02/09/2022)   Exercise Vital Sign    Days of Exercise per Week: 3 days    Minutes of Exercise per Session: 30 min  Stress: No Stress Concern Present (02/09/2022)   [employer] of Occupational Health - Occupational Stress Questionnaire    Feeling of Stress : Not at all  Social Connections: Socially Integrated (02/09/2022)   Social Connection and Isolation Panel [NHANES]    Frequency of Communication with Friends and Family: More than three times a week    Frequency of Social Gatherings with Friends and Family: Once a week    Attends Religious Services: More than 4 times per year    Active Member of [employer] or Organizations: Yes    Attends Engineer, Structural: More than 4 times per year    Marital Status: Married  Catering Manager Violence: Not At Risk (02/09/2022)   Humiliation, Afraid, Rape, and Kick questionnaire    Fear of Current or Ex-Partner: No    Emotionally Abused: No    Physically Abused: No    Sexually Abused: No     Constitutional: Denies fever, malaise, fatigue, headache or abrupt weight changes.  HEENT: Denies eye pain, eye redness, ear pain, ringing in the ears, wax buildup, runny nose, nasal congestion, bloody nose, or sore throat. Respiratory: Pt reports cough. Denies difficulty breathing, shortness of breath.   Cardiovascular: Denies chest pain, chest tightness, palpitations or swelling in the hands or feet.  Gastrointestinal: Denies abdominal pain, bloating, constipation, diarrhea or blood in the stool.  Neurological: Denies dizziness, difficulty with memory, difficulty with speech or problems with balance and coordination.    No other specific complaints in a complete review of systems (except as listed in HPI above).      Objective:   Physical Exam  BP 132/88 (BP Location: Left Arm, Patient Position: Sitting, Cuff Size: Large)   Pulse 65   Ht 5' 9  (1.753 m)   Wt 185 lb 12.8 oz (84.3 kg)   SpO2 100%   BMI 27.44 kg/m   Wt Readings from Last 3 Encounters:  02/16/23 190 lb (86.2 kg)  10/21/22 191 lb (86.6 kg)  05/20/22 194 lb (88 kg)    General: Appears his stated age, overweight, in NAD. Skin: Warm, dry and intact. HEENT: Head: normal shape and size, no sinus tenderness noted; Throat/Mouth: Teeth present, mucosa erythematous and moist, + PND, no exudate, lesions or ulcerations noted.  Neck:  No adenopathy noted. Cardiovascular: Normal rate and rhythm.  Pulmonary/Chest: Normal effort and positive vesicular breath sounds. No respiratory distress. No wheezes, rales or ronchi noted.   Neurological: Alert and oriented.  BMET    Component Value Date/Time   NA 140 02/16/2023 1356   K 4.1 02/16/2023 1356   CL 108 02/16/2023 1356   CO2 21 02/16/2023 1356   GLUCOSE 82 02/16/2023 1356   BUN 48 (H) 02/16/2023 1356   CREATININE 5.83 (H) 02/16/2023 1356   CALCIUM  9.1 02/16/2023 1356   GFRNONAA 48 (L) 05/21/2015 0827   GFRAA 55 (L) 05/21/2015 0827    Lipid Panel     Component Value Date/Time   CHOL 218 (H) 02/16/2023 1356   TRIG 525 (H) 02/16/2023 1356   HDL 34 (L) 02/16/2023 1356   CHOLHDL 6.4 (H) 02/16/2023 1356   LDLCALC  02/16/2023 1356     Comment:     . LDL cholesterol not calculated. Triglyceride levels greater than 400 mg/dL invalidate calculated LDL results. . Reference range: <100 . Desirable range <100 mg/dL for primary prevention;   <70 mg/dL for patients with CHD or diabetic patients  with > or = 2 CHD risk factors. SABRA LDL-C is now calculated using the Martin-Hopkins  calculation, which is a validated novel method providing  better accuracy than the Friedewald equation in the  estimation of LDL-C.  Gladis APPLETHWAITE et al. SANDREA. 7986;689(80): 2061-2068  (http://education.[employer].com/faq/FAQ164)     CBC    Component Value Date/Time   WBC 8.0 02/16/2023 1356   RBC 4.50 02/16/2023 1356   HGB 13.5  02/16/2023 1356   HCT 41.0 02/16/2023 1356   PLT 295 02/16/2023 1356   MCV 91.1 02/16/2023 1356   MCH 30.0 02/16/2023 1356   MCHC 32.9 02/16/2023 1356   RDW 13.1 02/16/2023 1356    Hgb A1C Lab Results  Component Value Date   HGBA1C 5.2 02/16/2023            Assessment & Plan:   Assessment and Plan    Upper Respiratory Infection With Cough Persistent cough with mucus production for three weeks. No fever, chills, body aches, or shortness of breath. No history of asthma. No smoking history. Physical exam revealed no signs of pneumonia. -Prescribe Azithromycin  (Z-Pak) for 5 days. -Prescribe Promethazine  DM cough syrup for symptomatic relief.     RTC in 6 months for follow-up of chronic conditions Angeline Laura, NP

## 2023-04-23 ENCOUNTER — Other Ambulatory Visit: Payer: Self-pay | Admitting: Internal Medicine

## 2023-04-23 ENCOUNTER — Telehealth: Payer: Self-pay

## 2023-04-23 ENCOUNTER — Other Ambulatory Visit: Payer: Self-pay

## 2023-04-23 MED ORDER — LOSARTAN POTASSIUM 100 MG PO TABS: 100.0000 mg | ORAL_TABLET | Freq: Every day | ORAL | 1 refills | Status: DC

## 2023-04-23 MED ORDER — POTASSIUM CHLORIDE CRYS ER 10 MEQ PO TBCR: 20.0000 meq | EXTENDED_RELEASE_TABLET | Freq: Two times a day (BID) | ORAL | 0 refills | Status: DC

## 2023-04-23 NOTE — Addendum Note (Signed)
 Addended by: Lorre Munroe on: 04/23/2023 09:06 AM   Modules accepted: Orders

## 2023-04-23 NOTE — Telephone Encounter (Signed)
 Refill placed 04/23/23 per MAR.

## 2023-04-23 NOTE — Telephone Encounter (Signed)
 Potassium 10 mEq 2 tabs twice daily sent to pharmacy

## 2023-04-23 NOTE — Telephone Encounter (Signed)
 Copied from CRM [id number]. Topic: Clinical - Prescription Issue >> Apr 23, 2023  8:33 AM Elle L wrote: Reason for CRM: The patient states he is unable to take the potassium chloride SA (KLOR-CON M) 20 MEQ tablet due to the pill size and is requesting it be changed back 10 MEQ.  The patient also states it has also been making his heart race and numbness and tingling in hands and feet. However, he declined nurse triage at this time. His number is [phone] if needed.

## 2023-04-23 NOTE — Telephone Encounter (Signed)
 Copied from CRM [id number]. Topic: Clinical - Medication Refill >> Apr 23, 2023  8:30 AM Elle L wrote: Most Recent Primary Care Visit:  Provider: Lorre Munroe  Department: [id number]  Visit Type: OFFICE VISIT  Date: 03/05/2023  Medication: losartan (COZAAR) 100 MG tablet  Has the patient contacted their pharmacy? Yes  Is this the correct pharmacy for this prescription? Yes If no, delete pharmacy and type the correct one.  This is the patient's preferred pharmacy:  [id number] - [place], [place] - [address] AT [facility] OF [address] [place] [address] Phone: [phone] Fax: [phone]   Has the prescription been filled recently? No  Is the patient out of the medication? Yes, two left.   Has the patient been seen for an appointment in the last year OR does the patient have an upcoming appointment? Yes  Can we respond through MyChart? No, the patient's call back [phone].   Agent: Please be advised that Rx refills may take up to 3 business days. We ask that you follow-up with your pharmacy.

## 2023-06-01 DEATH — deceased

## 2023-08-17 ENCOUNTER — Ambulatory Visit: Payer: Medicare Other | Admitting: Internal Medicine
